# Patient Record
Sex: Male | Born: 1940 | Race: White | Hispanic: No | Marital: Married | Smoking: Former smoker
Health system: Southern US, Community
[De-identification: ages and names within clinical notes are randomized; demographics above are authoritative.]

## PROBLEM LIST (undated history)

## (undated) DIAGNOSIS — R51 Headache: Secondary | ICD-10-CM

## (undated) DIAGNOSIS — I472 Ventricular tachycardia: Secondary | ICD-10-CM

## (undated) DIAGNOSIS — K267 Chronic duodenal ulcer without hemorrhage or perforation: Secondary | ICD-10-CM

## (undated) DIAGNOSIS — M6530 Trigger finger, unspecified finger: Secondary | ICD-10-CM

## (undated) DIAGNOSIS — Z87442 Personal history of urinary calculi: Secondary | ICD-10-CM

## (undated) DIAGNOSIS — I300 Acute nonspecific idiopathic pericarditis: Secondary | ICD-10-CM

## (undated) DIAGNOSIS — R519 Headache, unspecified: Secondary | ICD-10-CM

## (undated) DIAGNOSIS — J45909 Unspecified asthma, uncomplicated: Secondary | ICD-10-CM

## (undated) DIAGNOSIS — I4729 Other ventricular tachycardia: Secondary | ICD-10-CM

## (undated) DIAGNOSIS — I480 Paroxysmal atrial fibrillation: Secondary | ICD-10-CM

## (undated) DIAGNOSIS — E119 Type 2 diabetes mellitus without complications: Secondary | ICD-10-CM

## (undated) DIAGNOSIS — M545 Low back pain, unspecified: Secondary | ICD-10-CM

## (undated) DIAGNOSIS — E039 Hypothyroidism, unspecified: Secondary | ICD-10-CM

## (undated) DIAGNOSIS — IMO0001 Reserved for inherently not codable concepts without codable children: Secondary | ICD-10-CM

## (undated) DIAGNOSIS — K219 Gastro-esophageal reflux disease without esophagitis: Secondary | ICD-10-CM

## (undated) DIAGNOSIS — G8929 Other chronic pain: Secondary | ICD-10-CM

## (undated) DIAGNOSIS — N189 Chronic kidney disease, unspecified: Secondary | ICD-10-CM

## (undated) DIAGNOSIS — E785 Hyperlipidemia, unspecified: Secondary | ICD-10-CM

## (undated) DIAGNOSIS — J329 Chronic sinusitis, unspecified: Secondary | ICD-10-CM

## (undated) DIAGNOSIS — C3490 Malignant neoplasm of unspecified part of unspecified bronchus or lung: Secondary | ICD-10-CM

## (undated) HISTORY — DX: Other ventricular tachycardia: I47.29

## (undated) HISTORY — DX: Ventricular tachycardia: I47.2

## (undated) HISTORY — DX: Paroxysmal atrial fibrillation: I48.0

## (undated) HISTORY — PX: CATARACT EXTRACTION W/ INTRAOCULAR LENS  IMPLANT, BILATERAL: SHX1307

## (undated) HISTORY — DX: Acute nonspecific idiopathic pericarditis: I30.0

## (undated) HISTORY — PX: ATRIAL FIBRILLATION ABLATION: SHX5732

## (undated) HISTORY — PX: OTHER SURGICAL HISTORY: SHX169

## (undated) HISTORY — PX: BACK SURGERY: SHX140

## (undated) HISTORY — DX: Reserved for inherently not codable concepts without codable children: IMO0001

---

## 1982-05-25 HISTORY — PX: LUMBAR MICRODISCECTOMY: SHX99

## 1993-05-25 DIAGNOSIS — C3490 Malignant neoplasm of unspecified part of unspecified bronchus or lung: Secondary | ICD-10-CM

## 1993-05-25 HISTORY — PX: PNEUMONECTOMY: SHX168

## 1993-05-25 HISTORY — DX: Malignant neoplasm of unspecified part of unspecified bronchus or lung: C34.90

## 1998-01-15 ENCOUNTER — Ambulatory Visit (HOSPITAL_BASED_OUTPATIENT_CLINIC_OR_DEPARTMENT_OTHER): Admission: RE | Admit: 1998-01-15 | Discharge: 1998-01-15 | Payer: Self-pay | Admitting: Surgery

## 1998-05-25 HISTORY — PX: INGUINAL HERNIA REPAIR: SUR1180

## 1998-09-20 ENCOUNTER — Ambulatory Visit (HOSPITAL_COMMUNITY): Admission: RE | Admit: 1998-09-20 | Discharge: 1998-09-20 | Payer: Self-pay | Admitting: Unknown Physician Specialty

## 1998-09-20 ENCOUNTER — Encounter: Payer: Self-pay | Admitting: Unknown Physician Specialty

## 1999-05-27 ENCOUNTER — Encounter: Payer: Self-pay | Admitting: Oncology

## 1999-05-27 ENCOUNTER — Ambulatory Visit (HOSPITAL_COMMUNITY): Admission: RE | Admit: 1999-05-27 | Discharge: 1999-05-27 | Payer: Self-pay | Admitting: Oncology

## 2000-05-31 ENCOUNTER — Encounter: Payer: Self-pay | Admitting: Oncology

## 2000-05-31 ENCOUNTER — Encounter: Admission: RE | Admit: 2000-05-31 | Discharge: 2000-05-31 | Payer: Self-pay | Admitting: Oncology

## 2001-06-02 ENCOUNTER — Ambulatory Visit (HOSPITAL_COMMUNITY): Admission: RE | Admit: 2001-06-02 | Discharge: 2001-06-02 | Payer: Self-pay | Admitting: Oncology

## 2001-06-02 ENCOUNTER — Encounter: Payer: Self-pay | Admitting: Oncology

## 2001-11-22 ENCOUNTER — Ambulatory Visit (HOSPITAL_COMMUNITY): Admission: RE | Admit: 2001-11-22 | Discharge: 2001-11-22 | Payer: Self-pay | Admitting: Ophthalmology

## 2002-06-01 ENCOUNTER — Encounter: Payer: Self-pay | Admitting: Oncology

## 2002-06-01 ENCOUNTER — Ambulatory Visit (HOSPITAL_COMMUNITY): Admission: RE | Admit: 2002-06-01 | Discharge: 2002-06-01 | Payer: Self-pay | Admitting: Oncology

## 2003-06-05 ENCOUNTER — Encounter: Admission: RE | Admit: 2003-06-05 | Discharge: 2003-06-05 | Payer: Self-pay | Admitting: Oncology

## 2004-05-29 ENCOUNTER — Ambulatory Visit: Payer: Self-pay | Admitting: Oncology

## 2004-05-29 ENCOUNTER — Ambulatory Visit (HOSPITAL_COMMUNITY): Admission: RE | Admit: 2004-05-29 | Discharge: 2004-05-29 | Payer: Self-pay | Admitting: Oncology

## 2004-07-15 ENCOUNTER — Ambulatory Visit: Payer: Self-pay | Admitting: Family Medicine

## 2004-07-22 ENCOUNTER — Ambulatory Visit: Payer: Self-pay | Admitting: Family Medicine

## 2005-03-05 ENCOUNTER — Ambulatory Visit: Payer: Self-pay | Admitting: Family Medicine

## 2005-05-27 ENCOUNTER — Ambulatory Visit: Payer: Self-pay | Admitting: Oncology

## 2005-05-28 ENCOUNTER — Ambulatory Visit (HOSPITAL_COMMUNITY): Admission: RE | Admit: 2005-05-28 | Discharge: 2005-05-28 | Payer: Self-pay | Admitting: Oncology

## 2005-07-21 ENCOUNTER — Ambulatory Visit: Payer: Self-pay | Admitting: Family Medicine

## 2005-08-27 ENCOUNTER — Ambulatory Visit: Payer: Self-pay | Admitting: Oncology

## 2005-11-24 ENCOUNTER — Ambulatory Visit: Payer: Self-pay | Admitting: Family Medicine

## 2005-12-07 ENCOUNTER — Ambulatory Visit: Payer: Self-pay | Admitting: Family Medicine

## 2005-12-14 ENCOUNTER — Ambulatory Visit: Payer: Self-pay | Admitting: Family Medicine

## 2006-02-02 ENCOUNTER — Ambulatory Visit: Payer: Self-pay | Admitting: Family Medicine

## 2006-03-31 ENCOUNTER — Ambulatory Visit: Payer: Self-pay | Admitting: Family Medicine

## 2006-05-10 ENCOUNTER — Ambulatory Visit: Payer: Self-pay | Admitting: Family Medicine

## 2006-05-25 DIAGNOSIS — I300 Acute nonspecific idiopathic pericarditis: Secondary | ICD-10-CM

## 2006-05-25 HISTORY — DX: Acute nonspecific idiopathic pericarditis: I30.0

## 2006-05-26 ENCOUNTER — Ambulatory Visit: Payer: Self-pay | Admitting: Oncology

## 2006-05-31 ENCOUNTER — Ambulatory Visit (HOSPITAL_COMMUNITY): Admission: RE | Admit: 2006-05-31 | Discharge: 2006-05-31 | Payer: Self-pay | Admitting: Oncology

## 2006-07-19 ENCOUNTER — Ambulatory Visit: Payer: Self-pay | Admitting: Family Medicine

## 2006-08-19 ENCOUNTER — Encounter: Payer: Self-pay | Admitting: Cardiology

## 2006-08-25 ENCOUNTER — Ambulatory Visit: Payer: Self-pay | Admitting: Family Medicine

## 2006-09-22 ENCOUNTER — Ambulatory Visit: Payer: Self-pay | Admitting: Family Medicine

## 2007-05-25 ENCOUNTER — Ambulatory Visit: Payer: Self-pay | Admitting: Oncology

## 2007-05-30 ENCOUNTER — Ambulatory Visit (HOSPITAL_COMMUNITY): Admission: RE | Admit: 2007-05-30 | Discharge: 2007-05-30 | Payer: Self-pay | Admitting: Oncology

## 2007-06-08 ENCOUNTER — Ambulatory Visit: Payer: Self-pay | Admitting: Thoracic Surgery

## 2007-06-14 ENCOUNTER — Ambulatory Visit: Payer: Self-pay | Admitting: Thoracic Surgery

## 2007-06-14 ENCOUNTER — Encounter: Admission: RE | Admit: 2007-06-14 | Discharge: 2007-06-14 | Payer: Self-pay | Admitting: Thoracic Surgery

## 2007-09-06 ENCOUNTER — Ambulatory Visit: Payer: Self-pay | Admitting: Thoracic Surgery

## 2007-09-23 ENCOUNTER — Ambulatory Visit: Payer: Self-pay | Admitting: Oncology

## 2008-05-29 ENCOUNTER — Ambulatory Visit: Payer: Self-pay | Admitting: Oncology

## 2009-02-11 ENCOUNTER — Ambulatory Visit (HOSPITAL_COMMUNITY): Admission: RE | Admit: 2009-02-11 | Discharge: 2009-02-11 | Payer: Self-pay | Admitting: Oncology

## 2009-02-12 ENCOUNTER — Ambulatory Visit: Payer: Self-pay | Admitting: Oncology

## 2009-05-29 ENCOUNTER — Ambulatory Visit: Payer: Self-pay | Admitting: Oncology

## 2009-05-31 ENCOUNTER — Ambulatory Visit (HOSPITAL_COMMUNITY): Admission: RE | Admit: 2009-05-31 | Discharge: 2009-05-31 | Payer: Self-pay | Admitting: Oncology

## 2009-07-15 ENCOUNTER — Ambulatory Visit (HOSPITAL_COMMUNITY): Admission: RE | Admit: 2009-07-15 | Discharge: 2009-07-15 | Payer: Self-pay | Admitting: Family Medicine

## 2009-10-31 ENCOUNTER — Ambulatory Visit (HOSPITAL_COMMUNITY): Admission: RE | Admit: 2009-10-31 | Discharge: 2009-10-31 | Payer: Self-pay | Admitting: Oncology

## 2009-10-31 ENCOUNTER — Ambulatory Visit: Payer: Self-pay | Admitting: Oncology

## 2010-05-23 ENCOUNTER — Ambulatory Visit: Payer: Self-pay | Admitting: Oncology

## 2010-05-30 ENCOUNTER — Ambulatory Visit: Payer: Self-pay | Admitting: Oncology

## 2010-05-30 ENCOUNTER — Ambulatory Visit (HOSPITAL_COMMUNITY)
Admission: RE | Admit: 2010-05-30 | Discharge: 2010-05-30 | Payer: Self-pay | Source: Home / Self Care | Attending: Oncology | Admitting: Oncology

## 2010-10-07 NOTE — Letter (Signed)
June 08, 2007   Leighton Roach. Truett Perna, M.D.  [address] [facility]  [place] [address]   Re:  Zachary Bean, Zachary Bean                 DOB:  [birth year]   Dear Nida Boatman:   I saw the patient back today and we discussed his recurrent coughing.  I  agree that this is probably not recurrent cancer, but there could  possibly be something intrabronchially that could be causing him to  cough.  I plan to get a CT scan first, and then will probably go ahead  and do a bronchoscopy after the CT scan.  I will see him back here after  the CT scan.  His weight has been stable, and he has, other than a  cough, an excessive sputum.  He has no other pulmonary symptoms.  His  right lung was clear to auscultation and percussion.  Chest x-rays  showed a left pneumonectomy.  His blood pressure was 118/69, pulse 63,  respirations 18, sats were 95%.   Zachary Bean, M.D.  Electronically Signed   DPB/MEDQ  D:  06/08/2007  T:  06/08/2007  Job:  [id number]

## 2010-10-07 NOTE — Letter (Signed)
June 14, 2007   Leighton Roach. Truett Perna, M.D.  [address] [facility]  [place] [address]   Re:  Zachary Bean, Zachary Bean                 DOB:  13-Nov-1940   Dear Nida Boatman:   I saw Mr. Haack back in the office today.  His cough has improved.  His  lungs are clear to auscultation and percussion.  Blood pressure is  118/67, pulse 77, respirations 18, sats were 98%.  His CT scan showed no  evidence of recurrence of his cancer since the CT scan is negative.  I  doubt that he needs a bronchoscopy if his cough persists.  I said to let  us know and we would be happy to schedule him to have one in about 3-4  weeks.   Sincerely,   Ines Bloomer, M.D.  Electronically Signed   DPB/MEDQ  D:  06/14/2007  T:  06/14/2007  Job:  [id number]

## 2011-05-18 ENCOUNTER — Telehealth: Payer: Self-pay | Admitting: Oncology

## 2011-05-18 NOTE — Telephone Encounter (Signed)
Converted 1/7 appt to EPIC. lmonvm for pt re new d/t 1/11 @ 3:45 pm.

## 2011-06-05 ENCOUNTER — Other Ambulatory Visit: Payer: Self-pay | Admitting: Oncology

## 2011-06-05 ENCOUNTER — Ambulatory Visit (HOSPITAL_BASED_OUTPATIENT_CLINIC_OR_DEPARTMENT_OTHER): Payer: Medicare Other | Admitting: Oncology

## 2011-06-05 ENCOUNTER — Ambulatory Visit (HOSPITAL_COMMUNITY)
Admission: RE | Admit: 2011-06-05 | Discharge: 2011-06-05 | Disposition: A | Discharge status: Home / Self Care | Payer: Worker's Compensation | Source: Ambulatory Visit | Attending: Oncology | Admitting: Oncology

## 2011-06-05 ENCOUNTER — Telehealth: Payer: Self-pay | Admitting: Oncology

## 2011-06-05 VITALS — BP 110/62 | HR 74 | Temp 97.8°F | Ht 70.5 in | Wt 183.0 lb

## 2011-06-05 DIAGNOSIS — C3490 Malignant neoplasm of unspecified part of unspecified bronchus or lung: Secondary | ICD-10-CM

## 2011-06-05 DIAGNOSIS — Z85118 Personal history of other malignant neoplasm of bronchus and lung: Secondary | ICD-10-CM | POA: Insufficient documentation

## 2011-06-05 NOTE — Progress Notes (Signed)
OFFICE PROGRESS NOTE   INTERVAL HISTORY:   He feels well. He reports a good appetite and energy level. He has stable mild exertional dyspnea. He continues close followup with Dr. Paulino Rily for management of diabetes.    Objective:  Vital signs in last 24 hours:  Blood pressure 110/62, pulse 74, temperature 97.8 F (36.6 C), temperature source Oral, height 5' 10.5" (1.791 m), weight 183 lb (83.008 kg).    HEENT: Neck without mass Lymphatics: No cervical, supraclavicular, or axillary nodes Resp: Decreased breath sounds over the left chest. The right lung is clear. No respiratory distress. Cardio: Regular rate and rhythm GI: No hepatomegaly Vascular: No leg edema Musculoskeletal: Left thoracotomy scar without evidence of recurrent tumor.   X-rays: Chest x-ray on 06/05/2011-negative for metastatic or acute disease. Stable compared to prior exam.  Medications: I have reviewed the patient's current medications.  Assessment/Plan: 1. Stage III non-small cell lung cancer diagnosed in September 1995.  He remains in clinical remission. 2. History of a cough following an ACE inhibitor. 3. Admission to [facility] in February 2008 with atrial fibrillation and a pericardial effusion.   Disposition:  He remains in clinical remission from lung cancer. He would like to continue followup at the cancer Center. He will return for an office visit and chest x-ray in one year.   Lucile Shutters, MD  06/05/2011  5:00 PM

## 2011-06-05 NOTE — Telephone Encounter (Signed)
gv pt appt schedule for jan 2014 and referral for cxr to be done same day as visit

## 2011-08-13 ENCOUNTER — Encounter: Payer: Self-pay | Admitting: *Deleted

## 2011-08-24 DIAGNOSIS — IMO0001 Reserved for inherently not codable concepts without codable children: Secondary | ICD-10-CM

## 2011-08-24 HISTORY — DX: Reserved for inherently not codable concepts without codable children: IMO0001

## 2011-09-04 ENCOUNTER — Observation Stay (HOSPITAL_COMMUNITY)
Admission: EM | Admit: 2011-09-04 | Discharge: 2011-09-06 | DRG: 313 | Disposition: A | Discharge status: Home / Self Care | Payer: Medicare Other | Attending: Cardiovascular Disease | Admitting: Cardiovascular Disease

## 2011-09-04 ENCOUNTER — Emergency Department (HOSPITAL_COMMUNITY): Payer: Medicare Other

## 2011-09-04 ENCOUNTER — Encounter (HOSPITAL_COMMUNITY): Payer: Self-pay | Admitting: *Deleted

## 2011-09-04 ENCOUNTER — Other Ambulatory Visit: Payer: Self-pay

## 2011-09-04 DIAGNOSIS — R079 Chest pain, unspecified: Principal | ICD-10-CM | POA: Insufficient documentation

## 2011-09-04 DIAGNOSIS — Z85118 Personal history of other malignant neoplasm of bronchus and lung: Secondary | ICD-10-CM | POA: Insufficient documentation

## 2011-09-04 DIAGNOSIS — Z902 Acquired absence of lung [part of]: Secondary | ICD-10-CM | POA: Insufficient documentation

## 2011-09-04 DIAGNOSIS — I4729 Other ventricular tachycardia: Secondary | ICD-10-CM | POA: Insufficient documentation

## 2011-09-04 DIAGNOSIS — I472 Ventricular tachycardia, unspecified: Secondary | ICD-10-CM | POA: Insufficient documentation

## 2011-09-04 DIAGNOSIS — I4891 Unspecified atrial fibrillation: Secondary | ICD-10-CM | POA: Insufficient documentation

## 2011-09-04 DIAGNOSIS — R110 Nausea: Secondary | ICD-10-CM | POA: Insufficient documentation

## 2011-09-04 DIAGNOSIS — E119 Type 2 diabetes mellitus without complications: Secondary | ICD-10-CM | POA: Insufficient documentation

## 2011-09-04 LAB — CBC
MCH: 30.9 pg (ref 26.0–34.0)
MCHC: 33.9 g/dL (ref 30.0–36.0)
Platelets: 232 10*3/uL (ref 150–400)
RDW: 12.1 % (ref 11.5–15.5)

## 2011-09-04 LAB — POCT I-STAT, CHEM 8
Creatinine, Ser: 1.2 mg/dL (ref 0.50–1.35)
Hemoglobin: 14.3 g/dL (ref 13.0–17.0)
Sodium: 137 mEq/L (ref 135–145)
TCO2: 26 mmol/L (ref 0–100)

## 2011-09-04 LAB — POCT I-STAT TROPONIN I: Troponin i, poc: 0.01 ng/mL (ref 0.00–0.08)

## 2011-09-04 MED ORDER — NITROGLYCERIN 0.4 MG SL SUBL: 0.4000 mg | SUBLINGUAL_TABLET | SUBLINGUAL | Status: DC | PRN

## 2011-09-04 MED ORDER — NITROGLYCERIN 0.3 MG SL SUBL
0.3000 mg | SUBLINGUAL_TABLET | SUBLINGUAL | Status: DC | PRN
  Administered 2011-09-04: 0.3 mg via SUBLINGUAL
  Filled 2011-09-04: qty 100

## 2011-09-04 MED ORDER — METFORMIN HCL 500 MG PO TABS
500.0000 mg | ORAL_TABLET | Freq: Every day | ORAL | Status: DC
  Administered 2011-09-06: 500 mg via ORAL
  Filled 2011-09-04 (×2): qty 1

## 2011-09-04 MED ORDER — SIMVASTATIN 20 MG PO TABS
20.0000 mg | ORAL_TABLET | Freq: Every day | ORAL | Status: DC
  Administered 2011-09-04 – 2011-09-05 (×2): 20 mg via ORAL
  Filled 2011-09-04 (×3): qty 1

## 2011-09-04 MED ORDER — SODIUM CHLORIDE 0.9 % IJ SOLN
3.0000 mL | Freq: Two times a day (BID) | INTRAMUSCULAR | Status: DC
  Administered 2011-09-05 (×2): 3 mL via INTRAVENOUS

## 2011-09-04 MED ORDER — SODIUM CHLORIDE 0.9 % IJ SOLN
3.0000 mL | Freq: Two times a day (BID) | INTRAMUSCULAR | Status: DC
  Administered 2011-09-04 – 2011-09-05 (×2): 3 mL via INTRAVENOUS

## 2011-09-04 MED ORDER — LOSARTAN POTASSIUM 50 MG PO TABS
50.0000 mg | ORAL_TABLET | Freq: Every day | ORAL | Status: DC
  Administered 2011-09-05 – 2011-09-06 (×2): 50 mg via ORAL
  Filled 2011-09-04 (×2): qty 1

## 2011-09-04 MED ORDER — SODIUM CHLORIDE 0.9 % IJ SOLN: 3.0000 mL | INTRAMUSCULAR | Status: DC | PRN

## 2011-09-04 MED ORDER — SODIUM CHLORIDE 0.9 % IV SOLN: 250.0000 mL | INTRAVENOUS | Status: DC | PRN

## 2011-09-04 MED ORDER — ASPIRIN EC 325 MG PO TBEC
325.0000 mg | DELAYED_RELEASE_TABLET | Freq: Every day | ORAL | Status: DC
  Administered 2011-09-05 – 2011-09-06 (×2): 325 mg via ORAL
  Filled 2011-09-04 (×2): qty 1

## 2011-09-04 MED ORDER — ACETAMINOPHEN 325 MG PO TABS
650.0000 mg | ORAL_TABLET | Freq: Four times a day (QID) | ORAL | Status: DC | PRN
  Administered 2011-09-04: 650 mg via ORAL
  Filled 2011-09-04: qty 2

## 2011-09-04 MED ORDER — LEVOTHYROXINE SODIUM 100 MCG PO TABS
100.0000 ug | ORAL_TABLET | Freq: Every day | ORAL | Status: DC
  Administered 2011-09-05 – 2011-09-06 (×2): 100 ug via ORAL
  Filled 2011-09-04 (×3): qty 1

## 2011-09-04 MED ORDER — ACETAMINOPHEN 650 MG RE SUPP: 650.0000 mg | Freq: Four times a day (QID) | RECTAL | Status: DC | PRN

## 2011-09-04 MED ORDER — GLIPIZIDE ER 2.5 MG PO TB24
2.5000 mg | ORAL_TABLET | Freq: Every day | ORAL | Status: DC
  Administered 2011-09-06: 2.5 mg via ORAL
  Filled 2011-09-04 (×2): qty 1

## 2011-09-04 MED ORDER — OXYCODONE HCL 5 MG PO TABS: 5.0000 mg | ORAL_TABLET | ORAL | Status: DC | PRN

## 2011-09-04 MED ORDER — MORPHINE SULFATE 2 MG/ML IJ SOLN: 1.0000 mg | INTRAMUSCULAR | Status: DC | PRN

## 2011-09-04 MED ORDER — NITROGLYCERIN 0.4 MG SL SUBL
SUBLINGUAL_TABLET | SUBLINGUAL | Status: AC
  Filled 2011-09-04: qty 25

## 2011-09-04 MED ORDER — PANTOPRAZOLE SODIUM 40 MG PO TBEC
40.0000 mg | DELAYED_RELEASE_TABLET | Freq: Every day | ORAL | Status: DC
  Administered 2011-09-05: 40 mg via ORAL
  Filled 2011-09-04: qty 1

## 2011-09-04 NOTE — ED Notes (Signed)
[id number] Ready 

## 2011-09-04 NOTE — ED Notes (Signed)
Patient to be admitted; ordered dinner tray.

## 2011-09-04 NOTE — ED Notes (Signed)
Attempted to call report.  RN unable to take report.  Phone number and name given.  RN will return call.

## 2011-09-04 NOTE — ED Provider Notes (Signed)
History     CSN: [id number]  Arrival date & time 09/04/11  1408   First MD Initiated Contact with Patient 09/04/11 1646      Chief Complaint  Patient presents with  . Chest Pain     HPI Pt states last night he had an episode of left arm pain that went away on its own. States this am started having left arm pain that started radiating into chest and left side of jaw. Reports nausea.       Past Medical History  Diagnosis Date  . Acute idiopathic pericarditis     history of  . PAF (paroxysmal atrial fibrillation)   . DM (diabetes mellitus)   . History of lung cancer     Past Surgical History  Procedure Date  . Back surgery 1984  . Pneumonectomy 1995  . Hernia repair 2000    Family History  Problem Relation Age of Onset  . Heart failure    . Stroke    . Cancer    . Emphysema    . Hypertension    . Diabetes    . Coronary artery disease      History  Substance Use Topics  . Smoking status: Former Games developer  . Smokeless tobacco: Not on file  . Alcohol Use: No      Review of Systems  All other systems reviewed and are negative.    Allergies  Contrast media  Home Medications   Current Outpatient Rx  Name Route Sig Dispense Refill  . ASPIRIN 81 MG PO TABS Oral Take 81 mg by mouth at bedtime.     . OMEGA-3 FATTY ACIDS 1000 MG PO CAPS Oral Take 1 g by mouth 2 (two) times daily.    Marland Kitchen GLIPIZIDE ER 2.5 MG PO TB24 Oral Take 2.5 mg by mouth daily.    Marland Kitchen LEVOTHYROXINE SODIUM 100 MCG PO TABS Oral Take 100 mcg by mouth daily.    Marland Kitchen LOSARTAN POTASSIUM 50 MG PO TABS Oral Take 25 mg by mouth daily.     Marland Kitchen METFORMIN HCL 500 MG PO TABS Oral Take 250 mg by mouth at bedtime.     Marland Kitchen METOPROLOL SUCCINATE ER 50 MG PO TB24 Oral Take 50 mg by mouth daily. Take with or immediately following a meal.    . RANITIDINE HCL 150 MG PO TABS Oral Take 150 mg by mouth 2 (two) times daily.    Marland Kitchen SIMVASTATIN 20 MG PO TABS Oral Take 20 mg by mouth at bedtime.       BP 133/66  Pulse 64   Temp(Src) 98.2 F (36.8 C) (Oral)  Resp 13  SpO2 97%  Physical Exam  Nursing note and vitals reviewed. Constitutional: He is oriented to person, place, and time. He appears well-developed and well-nourished. No distress.  HENT:  Head: Normocephalic and atraumatic.  Eyes: Pupils are equal, round, and reactive to light.  Neck: Normal range of motion.  Cardiovascular: Normal rate and intact distal pulses.   No murmur heard.       Date: 09/04/2011  Rate: 65  Rhythm: normal sinus rhythm  QRS Axis: normal  Intervals: normal  ST/T Wave abnormalities: normal  Conduction Disutrbances: none  Narrative Interpretation: unremarkable      Pulmonary/Chest: No respiratory distress.  Abdominal: Normal appearance. He exhibits no distension.  Musculoskeletal: Normal range of motion.  Neurological: He is alert and oriented to person, place, and time. No cranial nerve deficit.  Skin: Skin is warm and dry. No  rash noted.  Psychiatric: He has a normal mood and affect. His behavior is normal.    ED Course  Procedures (including critical care time)  Labs Reviewed  POCT I-STAT, CHEM 8 - Abnormal; Notable for the following:    Glucose, Bld 112 (*)    All other components within normal limits  CBC  POCT I-STAT TROPONIN I   Dg Chest 2 View  09/04/2011  *RADIOLOGY REPORT*  Clinical Data: Chest pain  CHEST - 2 VIEW  Comparison: 05/26/2011  Findings: Again noted status post left pneumonectomy with complete opacification of the left hemithorax.  Right lung is clear.  Stable mild degenerative changes thoracic spine.  IMPRESSION: No active disease.  Status post left pneumonectomy.  No significant change  Original Report Authenticated By: [name], M.D.     1. Chest pain       MDM          Nelia Shi, MD 09/04/11 [phone]

## 2011-09-04 NOTE — H&P (Signed)
Physician History and Physical  Patient ID: Zachary Bean MRN: [id number] DOB/AGE: 02-Mar-1941 71 y.o. Admit date: 09/04/2011  Primary Care Physician: [name], MD, MD Primary Cardiologist:  [name]  Active Problems:  * No active hospital problems. *    HPI:  71 yo with left arm and jaw pain.  Started earlier today.  Intermitant.  No history of CAD.  Has been doing a lot of yard work but does not feel pain is positional or muscular.  Previous left Pneumonectomy by Dr Edwyna Shell for Lung CA.  History of afib with ablation at [facility].  Does fairly well at home and walks without difficulty so long as it is not up hill.  Denies dyspnea, palpitations PND, orthopnea, or edema.  No cough fever or pleurisy.  Compliant with meds.  CRF;s include DM on oral hypoglycemics not insulin.  ECG in ER normal as is POC enzymes but patient feels Jaw and arm still feel funny.    Review of systems complete and found to be negative unless listed above   Past Medical History  Diagnosis Date  . Acute idiopathic pericarditis     history of  . PAF (paroxysmal atrial fibrillation)   . DM (diabetes mellitus)   . History of lung cancer     Family History  Problem Relation Age of Onset  . Heart failure    . Stroke    . Cancer    . Emphysema    . Hypertension    . Diabetes    . Coronary artery disease      History   Social History  . Marital Status: Married    Spouse Name: N/A    Number of Children: N/A  . Years of Education: N/A   Occupational History  . retired    Social History Main Topics  . Smoking status: Former Games developer  . Smokeless tobacco: Not on file  . Alcohol Use: No  . Drug Use:   . Sexually Active:    Other Topics Concern  . Not on file   Social History Narrative  . No narrative on file    Past Surgical History  Procedure Date  . Back surgery 1984  . Pneumonectomy 1995  . Hernia repair 2000      (Not in a hospital admission)  Physical Exam: Blood pressure  133/66, pulse 64, temperature 98.2 F (36.8 C), temperature source Oral, resp. rate 13, SpO2 97.00%.    Thin male looks older than stated age Affect appropriate HEENT: normal Neck supple with no adenopathy JVP normal no bruits no thyromegaly Lungs Clear on right with pneumonectomy on left Heart:  S1/S2 no murmur, no rub, gallop or click PMI normal Abdomen: benighn, BS positve, no tenderness, no AAA no bruit.  No HSM or HJR Distal pulses intact with no bruits No edema Neuro non-focal Skin warm and dry No muscular weakness   Labs:   Lab Results  Component Value Date   WBC 7.6 09/04/2011   HGB 14.3 09/04/2011   HCT 42.0 09/04/2011   MCV 91.3 09/04/2011   PLT 232 09/04/2011    Lab 09/04/11 1549  NA 137  K 4.1  CL 102  CO2 --  BUN 17  CREATININE 1.20  CALCIUM --  PROT --  BILITOT --  ALKPHOS --  ALT --  AST --  GLUCOSE 112*      Radiology: Dg Chest 2 View  09/04/2011  *RADIOLOGY REPORT*  Clinical Data: Chest pain  CHEST - 2 VIEW  Comparison: 05/26/2011  Findings: Again noted status post left pneumonectomy with complete opacification of the left hemithorax.  Right lung is clear.  Stable mild degenerative changes thoracic spine.  IMPRESSION: No active disease.  Status post left pneumonectomy.  No significant change  Original Report Authenticated By: [name], M.D.    EKG: NSR normal with no acute ischemic changes  ASSESSMENT AND PLAN: Chest Pain:  Atypical with normal ECG and POC markers.  Diabetic with persistant symptoms.  Wife scared to take him home as well.  Reasonable to admit and do myovue in am  He indicates He is able to walk fairly well and prefer this to [place] given previous pneumonectomy DM:  Continue oral hypoglycemics.   Lung CA:  Past 15 year survivor with no recurrence.  CXR ok PAF:  S/P ablation with no palpitations and normal ECG today   Signed: [name]4/04/2012, 5:24 PM

## 2011-09-04 NOTE — ED Notes (Signed)
Pt states last night he had an episode of left arm pain that went away on its own. States this am started having left arm pain that started radiating into chest and left side of jaw. Reports nausea.

## 2011-09-04 NOTE — ED Notes (Signed)
Patient reporting pain to left jaw, arm and chest beginning last night. Patient reporting pain has worsened this morning through this afternoon with a loss of appetite. Patient denies SOB, dizziness, n/v at this time.

## 2011-09-04 NOTE — Progress Notes (Signed)
Pt stated he felt pain in his left arm that radiated to his jaw. He said his pain was a 6/10. Pt VS stable. HR 60's, BP 117/69, O2 sat 97% on RA.  Pt's  Pain was relived with two nitro. O2 was applied for comfort.  NP on call made aware. Will cont to monitor pt.

## 2011-09-05 ENCOUNTER — Inpatient Hospital Stay (HOSPITAL_COMMUNITY): Payer: Medicare Other

## 2011-09-05 DIAGNOSIS — I369 Nonrheumatic tricuspid valve disorder, unspecified: Secondary | ICD-10-CM

## 2011-09-05 DIAGNOSIS — R079 Chest pain, unspecified: Secondary | ICD-10-CM

## 2011-09-05 LAB — CARDIAC PANEL(CRET KIN+CKTOT+MB+TROPI)
CK, MB: 6.6 ng/mL (ref 0.3–4.0)
Relative Index: 2.1 (ref 0.0–2.5)
Relative Index: 2.4 (ref 0.0–2.5)
Relative Index: 2.4 (ref 0.0–2.5)
Total CK: 321 U/L — ABNORMAL HIGH (ref 7–232)
Total CK: 259 U/L — ABNORMAL HIGH (ref 7–232)
Total CK: 256 U/L — ABNORMAL HIGH (ref 7–232)
Troponin I: <0.3 ng/mL (ref <0.30)

## 2011-09-05 LAB — MAGNESIUM: Magnesium: 2.3 mg/dL (ref 1.5–2.5)

## 2011-09-05 MED ORDER — REGADENOSON 0.4 MG/5ML IV SOLN
0.4000 mg | Freq: Once | INTRAVENOUS | Status: AC
  Administered 2011-09-05: 0.4 mg via INTRAVENOUS
  Filled 2011-09-05: qty 5

## 2011-09-05 MED ORDER — TECHNETIUM TC 99M TETROFOSMIN IV KIT
30.0000 | PACK | Freq: Once | INTRAVENOUS | Status: AC | PRN
  Administered 2011-09-05: 30 via INTRAVENOUS

## 2011-09-05 MED ORDER — TECHNETIUM TC 99M TETROFOSMIN IV KIT
10.0000 | PACK | Freq: Once | INTRAVENOUS | Status: AC | PRN
  Administered 2011-09-05: 10 via INTRAVENOUS

## 2011-09-05 NOTE — Discharge Summary (Signed)
Discharge Summary   Patient ID: Zachary Bean MRN: [id number], DOB/AGE: 1941/02/11 71 y.o. Admit date: 09/04/2011 D/C date:     09/06/2011    Primary Care Physician:  [name], MD, MD  Primary Cardiologist:  Dr. Zaccary Creech [place]  Primary Electrophysiologist:  None   Reason for Admission:  Left Arm, Chest and Jaw Pain  Primary Discharge Diagnoses:  1.  Chest Pain, Etiology Unclear  A.  Suspect Musculoskeletal Pain  B.  No ischemia on nuclear study; EF normal on Echo 2.  Non-Sustained VTach  A.  Noted on Tele  B.  Echo with normal EF - continue beta blocker 3.  Lung CA s/p Pneumonectomy 4.  Diabetes Mellitus 5.  Paroxysmal Atrial Fibrillation s/p Ablation at [facility]   Secondary Discharge Diagnoses:   Past Medical History  Diagnosis Date  . Acute idiopathic pericarditis     history of  . PAF (paroxysmal atrial fibrillation)   . DM (diabetes mellitus)   . History of lung cancer      Procedures Performed This Admission:  None   Hospital Course: Zachary Bean is a 71 y.o. male who presented to the hospital with left arm, chest and jaw pain.  He had been doing a lot of yard work but did not feel it was positional.  He has a h/o DM2, lung cancer, s/p pneumonectomy, AFib, s/p ablation at [facility] in the past.   CEs remained negative.  He had 7 beats of NSVT noted on telemetry.  E-lytes were normal.  Patient underwent Lexiscan Myoview 09/05/11.  Nuclear images reviewed by Dr. [name] and EF noted to be mildly reduced but no ischemia or scar.  With NSVT noted on tele, he was kept in house for Echo.  Echo demonstrated normal LV function.  He is felt ready for d/c to home.   Discharge Vitals: Blood pressure 110/68, pulse 86, temperature 98.7 F (37.1 C), temperature source Oral, resp. rate 18, height 5\' 10"  (1.778 m), weight 182 lb 12.2 oz (82.9 kg), SpO2 96.00%.  Labs: Lab Results  Component Value Date   WBC 7.6 09/04/2011   HGB 14.3 09/04/2011   HCT 42.0 09/04/2011   MCV 91.3  09/04/2011   PLT 232 09/04/2011     Lab 09/04/11 1549  NA 137  K 4.1  CL 102  CO2 --  BUN 17  CREATININE 1.20  CALCIUM --  PROT --  BILITOT --  ALKPHOS --  ALT --  AST --  GLUCOSE 112*    Basename 09/05/11 1340 09/05/11 1258 09/05/11 0218  CKTOTAL 256* 259* 321*  CKMB 6.1* 6.1* 6.6*  TROPONINI <0.30 <0.30 <0.30    Diagnostic Studies/Procedures:   Dg Chest 2 View  09/04/2011   IMPRESSION: No active disease.  Status post left pneumonectomy.  No significant change  Original Report Authenticated By: [name], M.D.   Nm Myocar Multi W/spect W/wall Motion / Ef  09/05/2011   IMPRESSION:  1.  No reversible ischemia or infarction. 2.  Normal wall motion. 3.  Left ventricular ejection fraction equal 49 %  Original Report Authenticated By: Genevive Bi, M.D.    2D Echocardiogram 09/05/11: Study Conclusions  - Left ventricle: The cavity size was normal. Systolic function was normal. The estimated ejection fraction was in the range of 55% to 60%. Wall motion was normal; there were no regional wall motion abnormalities. - Right atrium: The atrium was mildly dilated. - Atrial septum: No defect or patent foramen ovale was identified.   Discharge Medications  Medication List  As of 09/06/2011  8:31 AM   TAKE these medications         aspirin 81 MG tablet   Take 81 mg by mouth at bedtime.      fish oil-omega-3 fatty acids 1000 MG capsule   Take 1 g by mouth 2 (two) times daily.      glipiZIDE 2.5 MG 24 hr tablet   Commonly known as: GLUCOTROL XL   Take 2.5 mg by mouth daily.      levothyroxine 100 MCG tablet   Commonly known as: SYNTHROID, LEVOTHROID   Take 100 mcg by mouth daily.      losartan 50 MG tablet   Commonly known as: COZAAR   Take 25 mg by mouth daily.      metFORMIN 500 MG tablet   Commonly known as: GLUCOPHAGE   Take 250 mg by mouth at bedtime.      metoprolol succinate 50 MG 24 hr tablet   Commonly known as: TOPROL-XL   Take 50 mg by mouth  daily. Take with or immediately following a meal.      nitroGLYCERIN 0.4 MG SL tablet   Commonly known as: NITROSTAT   Place 1 tablet (0.4 mg total) under the tongue every 5 (five) minutes as needed for chest pain.      ranitidine 150 MG tablet   Commonly known as: ZANTAC   Take 150 mg by mouth 2 (two) times daily.      simvastatin 20 MG tablet   Commonly known as: ZOCOR   Take 20 mg by mouth at bedtime.            Disposition   The patient will be discharged in stable condition to home. Discharge Orders    Future Appointments: Provider: Department: Dept Phone: Center:   06/02/2012 10:30 AM Ladene Artist, MD [facility] [phone] None     Future Orders Please Complete By Expires   Diet - low sodium heart healthy      Increase activity slowly        Follow-up Information    Follow up with [name], MD. Schedule an appointment as soon as possible for a visit in 2 weeks.      Follow up with Soriya Worster [place], MD in 2 weeks. (office will call you for an appointment)    Contact information:   [address] [place] [address] [phone]            Duration of Discharge Encounter: Greater than 30 minutes including physician and PA time.  Signed, Tereso Newcomer, PA-C  8:31 AM 09/06/2011        [address] [place], [place]  [address] Phone: [phone] Fax:  [phone]

## 2011-09-05 NOTE — Progress Notes (Signed)
Pt was asleep in bed and had a 7 beats of wide QRS. Pt's VS stable. BP 117/62, O2 98% on ra. Pt's  resting HR has been in  the 60's SR. MD on call made aware and new orders received. Will cont to monitor pt.

## 2011-09-05 NOTE — Progress Notes (Signed)
Nm Myocar Multi W/spect W/wall Motion / Ef  09/05/2011  *RADIOLOGY REPORT*  Clinical Data:  71 year old male with chest pain.  MYOCARDIAL IMAGING WITH SPECT (REST AND PHARMACOLOGIC-STRESS) GATED LEFT VENTRICULAR WALL MOTION STUDY LEFT VENTRICULAR EJECTION FRACTION  Technique:  Resting myocardial SPECT imaging was initially performed after intravenous administration of radiopharmaceutical. Myocardial SPECT was subsequently performed after additional radiopharmaceutical injection during pharmacologic-stress supervised by the Cardiology staff.  Quantitative gated imaging was also performed to evaluate left ventricular wall motion, and estimate left ventricular ejection fraction.  Radiopharmaceutical:  Tc-48m Myoview at rest and during stress.  Comparison: none  Findings:  Technique: Study is adequate.  Perfusion:  There are no relative decreased counts on stress or rest to suggest reversible ischemia or infarction.  Wall motion:  No focal wall motion abnormality.  Normal contractility.  Left ventricular ejection fraction:  Calculated left ventricular ejection fraction =  49%  IMPRESSION:  1.  No reversible ischemia or infarction. 2.  Normal wall motion. 3.  Left ventricular ejection fraction equal 49 %  Original Report Authenticated By: Genevive Bi, M.D.    D/w Dr. [name].  Patient had one episode of NSVT noted on Tele. Will keep in house until tomorrow and order Echo for today to confirm EF. Tereso Newcomer, PA-C  2:15 PM 09/05/2011

## 2011-09-05 NOTE — Progress Notes (Signed)
CRITICAL VALUE ALERT  Critical value received: CKMB 6.6  Date of notification: 09/11/2011  Time of notification:  0400am  Critical value read back: Yes  Nurse who received alert: Mellody Dance  MD notified (1st page):  [place]  Time of first page:  0405  MD notified (2nd page):  Time of second page:  Responding MD: Bamimore  Time MD responded: 0410   No new orders received. Will cont to monitor pt.

## 2011-09-05 NOTE — Progress Notes (Addendum)
Progress Note  Name:  Zachary Bean  Date:  09/05/2011 10:13 AM    Subjective:  No more chest pain.  For stress test today.   Objective:   Filed Vitals:   09/05/11 0653 09/05/11 0700 09/05/11 0915 09/05/11 1000  BP: 114/76  114/64 130/95  Pulse: 88  68 75  Temp:      TempSrc:      Resp:      Height:      Weight:  182 lb 12.2 oz (82.9 kg)    SpO2:        Intake/Output Summary (Last 24 hours) at 09/05/11 1013 Last data filed at 09/05/11 0900  Gross per 24 hour  Intake      0 ml  Output    425 ml  Net   -425 ml    Tele:  Sinus rhythm.  7 beats NSVT  PHYSICAL EXAM General: no acute distress Neck:  No JVD Lungs: Clear to auscultation on right; absent breath sounds on left  Heart: normal S1, S2;  Regular rate and rhythm, no murmur Abdomen: soft, nontender Extremities: no edema Skin:  warm and dry Psych:  Normal affect  Labs:  [facility] 09/05/11 0219 09/04/11 1549  NA -- 137  K -- 4.1  CL -- 102  CO2 -- --  GLUCOSE -- 112*  BUN -- 17  CREATININE -- 1.20  CALCIUM -- --  MG 2.3 --  PHOS -- --   No results found for this basename: AST:2,ALT:2,ALKPHOS:2,BILITOT:2,PROT:2,ALBUMIN:2 in the last 72 hours No results found for this basename: LIPASE:2,AMYLASE:2 in the last 72 hours  Basename 09/04/11 1549 09/04/11 1507  WBC -- 7.6  NEUTROABS -- --  HGB 14.3 14.2  HCT 42.0 41.9  MCV -- 91.3  PLT -- 232    Basename 09/05/11 0218  CKTOTAL 321*  CKMB 6.6*  TROPONINI <0.30   No components found with this basename: POCBNP:3 No results found for this basename: DDIMER in the last 72 hours No results found for this basename: HGBA1C in the last 72 hours No results found for this basename: CHOL,HDL,LDLCALC,TRIG,CHOLHDL in the last 72 hours No results found for this basename: TSH,T4TOTAL,FREET3,T3FREE,THYROIDAB in the last 72 hours No results found for this basename: VITAMINB12,FOLATE,FERRITIN,TIBC,IRON,RETICCTPCT in the last 72 hours  Radiology/Studies:  Dg Chest 2  View  09/04/2011  *RADIOLOGY REPORT*  Clinical Data: Chest pain  CHEST - 2 VIEW  Comparison: 05/26/2011  Findings: Again noted status post left pneumonectomy with complete opacification of the left hemithorax.  Right lung is clear.  Stable mild degenerative changes thoracic spine.  IMPRESSION: No active disease.  Status post left pneumonectomy.  No significant change  Original Report Authenticated By: [name], M.D.   Assessment and Plan:  Chest Pain:  MI ruled out.  Lexiscan Myoview done today.    -  Lexiscan injected.  Patient tolerated well.  Images Pending.  No EKG changes.  NSVT: E-lytes ok.  Will assess EF with myoview.   DM: Continue oral hypoglycemics.   Lung CA:  Past 15 year survivor with no recurrence. CXR ok   PAF:  S/P ablation with no palpitations and normal ECG today   Signed, Tereso Newcomer, PA-C  10:13 AM 09/05/2011     Will be able to D/C if myovue is normal     Doing well today without symptoms of chest pain/ neck pain. Dr Eden Emms to review myoview later atoday.  Fayrene Fearing Finnley Lewis,MD 11:31 AM 09/05/2011

## 2011-09-05 NOTE — Progress Notes (Signed)
  Echocardiogram 2D Echocardiogram has been performed.  Zachary Bean 09/05/2011, 4:18 PM

## 2011-09-06 MED ORDER — NITROGLYCERIN 0.4 MG SL SUBL: 0.4000 mg | SUBLINGUAL_TABLET | SUBLINGUAL | Status: AC | PRN

## 2011-09-06 NOTE — Progress Notes (Signed)
Progress Note  Name:  Zachary Bean  Today's Date:  09/06/2011 8:00 AM  Admission Date:  09/04/2011  LOS:  2    Subjective:   No chest pain.  No significant dyspnea.     Objective:   Filed Vitals:   09/05/11 1016 09/05/11 1400 09/05/11 2100 09/06/11 0500  BP: 130/70 115/73 100/66 110/68  Pulse: 69 88 81 86  Temp:  97.5 F (36.4 C) 97.9 F (36.6 C) 98.7 F (37.1 C)  TempSrc:  Oral    Resp:  18 18 18   Height:      Weight:      SpO2:  96% 97% 96%    Intake/Output Summary (Last 24 hours) at 09/06/11 0800 Last data filed at 09/05/11 2100  Gross per 24 hour  Intake    120 ml  Output      0 ml  Net    120 ml    Tele:   Sinus rhythm.  PHYSICAL EXAM General: no acute distress Neck:  No JVD at 90 degrees Lungs: Clear to auscultation on right; decreased breath sounds at left base Heart: normal S1, S2;  Regular rate and rhythm, no murmur Abdomen: soft  Extremities: no edema Skin:  warm and dry Psych:  Normal affect  Labs:  [facility] 09/05/11 0219 09/04/11 1549  NA -- 137  K -- 4.1  CL -- 102  CO2 -- --  GLUCOSE -- 112*  BUN -- 17  CREATININE -- 1.20  CALCIUM -- --  MG 2.3 --  PHOS -- --   No results found for this basename: AST:2,ALT:2,ALKPHOS:2,BILITOT:2,PROT:2,ALBUMIN:2 in the last 72 hours No results found for this basename: LIPASE:2,AMYLASE:2 in the last 72 hours  Basename 09/04/11 1549 09/04/11 1507  WBC -- 7.6  NEUTROABS -- --  HGB 14.3 14.2  HCT 42.0 41.9  MCV -- 91.3  PLT -- 232    Basename 09/05/11 1340 09/05/11 1258 09/05/11 0218  CKTOTAL 256* 259* 321*  CKMB 6.1* 6.1* 6.6*  TROPONINI <0.30 <0.30 <0.30   No components found with this basename: POCBNP:3 No results found for this basename: DDIMER in the last 72 hours No results found for this basename: HGBA1C in the last 72 hours No results found for this basename: CHOL,HDL,LDLCALC,TRIG,CHOLHDL in the last 72 hours No results found for this basename: TSH,T4TOTAL,FREET3,T3FREE,THYROIDAB in  the last 72 hours No results found for this basename: VITAMINB12,FOLATE,FERRITIN,TIBC,IRON,RETICCTPCT in the last 72 hours  Radiology/Studies:   Dg Chest 2 View 09/04/2011   IMPRESSION: No active disease.  Status post left pneumonectomy.  No significant change  Original Report Authenticated By: [name], M.D.   Nm Myocar Multi W/spect W/wall Motion / Ef  09/05/2011  IMPRESSION:  1.  No reversible ischemia or infarction. 2.  Normal wall motion. 3.  Left ventricular ejection fraction equal 49 %  Original Report Authenticated By: Genevive Bi, M.D.    2D Echocardiogram 09/05/11:  Study Conclusions  - Left ventricle: The cavity size was normal. Systolic function was normal. The estimated ejection fraction was in the range of 55% to 60%. Wall motion was normal; there were no regional wall motion abnormalities. - Right atrium: The atrium was mildly dilated. - Atrial septum: No defect or patent foramen ovale was identified.   Assessment and Plan:  Chest Pain:  MI ruled out.  Lexiscan Myoview yesterday without ischemia or scar.  EF 49%.  Echo done with normal EF.  ? MSK pain.  Discharge home today after seen by MD.  Follow up  with Dr. Brooklyn Jeff Bean in 2-4 weeks.  NSVT: E-lytes ok.  EF normal by echo.   DM: Continue oral hypoglycemics.   Lung CA:  Past 15 year survivor with no recurrence. S/p pneumonectomy.  CXR ok   PAF:  S/P ablation with no palpitations.  Follow up with Dr. Landry Kamath Bean as outpatient.   Signed, Tereso Newcomer, PA-C  8:00 AM 09/06/2011     Echo reviewed normal EF no RWMA;s  D/C home [name] 10:01 AM 09/06/2011

## 2011-09-21 ENCOUNTER — Encounter: Payer: Self-pay | Admitting: Nurse Practitioner

## 2011-09-21 ENCOUNTER — Ambulatory Visit (INDEPENDENT_AMBULATORY_CARE_PROVIDER_SITE_OTHER): Payer: Medicare Other | Admitting: Nurse Practitioner

## 2011-09-21 DIAGNOSIS — I4729 Other ventricular tachycardia: Secondary | ICD-10-CM

## 2011-09-21 DIAGNOSIS — I472 Ventricular tachycardia: Secondary | ICD-10-CM

## 2011-09-21 DIAGNOSIS — R079 Chest pain, unspecified: Secondary | ICD-10-CM | POA: Insufficient documentation

## 2011-09-21 NOTE — Assessment & Plan Note (Signed)
He remains on beta blocker therapy. No recurrence that he is aware of.

## 2011-09-21 NOTE — Patient Instructions (Signed)
I think you are doing well.  Stay on your current medicines.  Stay active. You may get back to your regular routine  We will see you in 4 months.  Call the [facility] office at [phone] if you have any questions, problems or concerns.

## 2011-09-21 NOTE — Assessment & Plan Note (Signed)
Has had recent overnight admission for chest/left arm/jaw pain. He has had a negative Myoview. His echo showed a normal EF. He remains on beta blocker therapy. He has had no recurrence of symptoms. Have given him the ok to resume his regular activities. If he has any recurrent symptoms, he will need cardiac catheterization. Risk factor modification is encouraged. We will see him back in 4 months. Patient is agreeable to this plan and will call if any problems develop in the interim.

## 2011-09-21 NOTE — Progress Notes (Signed)
Zachary Bean Date of Birth: 1941/01/16 Medical Record #[id number]  History of Present Illness: Zachary Bean is seen today for a post hospital visit. He is seen for Dr. Swazil[place]. He has had a remote history of pericarditis and had a fluoroscopic window. Other issues include PAF, past ablation at [facility], left pneumonectomy, HTN and DM.  He had an overnight admission earlier this month for some left arm, chest and jaw pain. His enzymes were negative. He had a negative Myoview with no scar or ischemia. EF was slightly down but he had a normal echo with an EF of 55 to 60%. . Also noted to have a short run of NSVT. He was continued on his beta blocker.   He comes in today. He is here with his wife. He is doing well. Has had no further symptoms but has not returned to his full activity level yet. Denies chest pain, shortness of breath, palpitations or dizziness. Feels ok on his medicines.   Current Outpatient Prescriptions on File Prior to Visit  Medication Sig Dispense Refill  . aspirin 81 MG tablet Take 81 mg by mouth at bedtime.       . fish oil-omega-3 fatty acids 1000 MG capsule Take 1 g by mouth 2 (two) times daily.      Marland Kitchen glipiZIDE (GLUCOTROL XL) 2.5 MG 24 hr tablet Take 2.5 mg by mouth daily.      Marland Kitchen levothyroxine (SYNTHROID, LEVOTHROID) 100 MCG tablet Take 100 mcg by mouth daily.      Marland Kitchen losartan (COZAAR) 50 MG tablet Take 25 mg by mouth daily.       . metFORMIN (GLUCOPHAGE) 500 MG tablet Take 250 mg by mouth at bedtime.       . metoprolol succinate (TOPROL-XL) 50 MG 24 hr tablet Take 50 mg by mouth daily. Take with or immediately following a meal.      . nitroGLYCERIN (NITROSTAT) 0.4 MG SL tablet Place 1 tablet (0.4 mg total) under the tongue every 5 (five) minutes as needed for chest pain.  25 tablet  2  . ranitidine (ZANTAC) 150 MG tablet Take 150 mg by mouth 2 (two) times daily.      . simvastatin (ZOCOR) 20 MG tablet Take 20 mg by mouth at bedtime.         Allergies  Allergen Reactions  .  Contrast Media (Iodinated Diagnostic Agents) Hives and Itching    Past Medical History  Diagnosis Date  . Acute idiopathic pericarditis 2008    s/p fluoroscopic pericardial window at [facility]  . PAF (paroxysmal atrial fibrillation)     s/p ablation at [facility]  . DM (diabetes mellitus)   . History of lung cancer     s/p left pneumonectomy in 1995; also treated with radiation  . NSVT (nonsustained ventricular tachycardia)   . Normal nuclear stress test 08/2011    EF was slightly down but normal by echo; no ischemia or scar noted.   . Normal echocardiogram 08/2011    Past Surgical History  Procedure Date  . Back surgery 1984  . Pneumonectomy 1995  . Hernia repair 2000    History  Smoking status  . Former Smoker  Smokeless tobacco  . Not on file    History  Alcohol Use No    Family History  Problem Relation Age of Onset  . Heart failure    . Stroke    . Cancer    . Emphysema    . Hypertension    .  Diabetes    . Coronary artery disease    . Stroke Mother   . Heart failure Father     Review of Systems: The review of systems is per the HPI.  All other systems were reviewed and are negative.  Physical Exam: BP 122/72  Pulse 82  Ht 5\' 10"  (1.778 m)  Wt 185 lb (83.915 kg)  BMI 26.54 kg/m2 Patient is very pleasant and in no acute distress. Skin is warm and dry. Color is normal.  HEENT is unremarkable. Normocephalic/atraumatic. PERRL. Sclera are nonicteric. Neck is supple. No masses. No JVD. Lungs are clear, decreased on the left. Cardiac exam shows a regular rate and rhythm. Abdomen is soft. Extremities are without edema. Gait and ROM are intact. No gross neurologic deficits noted.     Lab Results  Component Value Date   WBC 7.6 09/04/2011   HGB 14.3 09/04/2011   HCT 42.0 09/04/2011   PLT 232 09/04/2011   GLUCOSE 112* 09/04/2011   NA 137 09/04/2011   K 4.1 09/04/2011   CL 102 09/04/2011   CREATININE 1.20 09/04/2011   BUN 17 09/04/2011   CARDIOLITE IMPRESSION:  1. No  reversible ischemia or infarction. 2. Normal wall motion. 3. Left ventricular ejection fraction equal 49 %   Echo Study Conclusions  - Left ventricle: The cavity size was normal. Systolic function was normal. The estimated ejection fraction was in the range of 55% to 60%. Wall motion was normal; there were no regional wall motion abnormalities. - Right atrium: The atrium was mildly dilated. - Atrial septum: No defect or patent foramen ovale was identified.     Assessment / Plan:

## 2012-01-21 ENCOUNTER — Ambulatory Visit: Payer: Medicare Other | Admitting: Cardiology

## 2012-02-04 ENCOUNTER — Encounter: Payer: Self-pay | Admitting: Cardiology

## 2012-02-04 ENCOUNTER — Ambulatory Visit (INDEPENDENT_AMBULATORY_CARE_PROVIDER_SITE_OTHER): Payer: Medicare Other | Admitting: Cardiology

## 2012-02-04 VITALS — BP 122/60 | HR 79 | Ht 70.0 in | Wt 182.8 lb

## 2012-02-04 DIAGNOSIS — I480 Paroxysmal atrial fibrillation: Secondary | ICD-10-CM | POA: Insufficient documentation

## 2012-02-04 DIAGNOSIS — I300 Acute nonspecific idiopathic pericarditis: Secondary | ICD-10-CM

## 2012-02-04 DIAGNOSIS — I472 Ventricular tachycardia: Secondary | ICD-10-CM

## 2012-02-04 DIAGNOSIS — I4891 Unspecified atrial fibrillation: Secondary | ICD-10-CM

## 2012-02-04 NOTE — Progress Notes (Signed)
Zachary Bean Date of Birth: 07-Aug-1940 Medical Record #[id number]  History of Present Illness: Zachary Bean is seen today for a followup visit. He was admitted to the hospital this spring with chest pain. He ruled out for myocardial infarction. He had an echocardiogram which was normal. He also had a negative Myoview study. Since that time his chest pain has resolved. He denies any shortness of breath or edema. His only complaint today is of leg cramps at night. His major form of exercises dancing 4 days a week. He is also remodeling a house.   Current Outpatient Prescriptions on File Prior to Visit  Medication Sig Dispense Refill  . aspirin 81 MG tablet Take 81 mg by mouth at bedtime.       . fish oil-omega-3 fatty acids 1000 MG capsule Take 1 g by mouth 2 (two) times daily.      Marland Kitchen glipiZIDE (GLUCOTROL XL) 2.5 MG 24 hr tablet Take 2.5 mg by mouth daily.      Marland Kitchen levothyroxine (SYNTHROID, LEVOTHROID) 100 MCG tablet Take 100 mcg by mouth daily.      Marland Kitchen losartan (COZAAR) 50 MG tablet Take 25 mg by mouth daily.       . metFORMIN (GLUCOPHAGE) 500 MG tablet Take 250 mg by mouth at bedtime.       . metoprolol succinate (TOPROL-XL) 50 MG 24 hr tablet Take 50 mg by mouth daily. Take with or immediately following a meal.      . nitroGLYCERIN (NITROSTAT) 0.4 MG SL tablet Place 1 tablet (0.4 mg total) under the tongue every 5 (five) minutes as needed for chest pain.  25 tablet  2  . ranitidine (ZANTAC) 150 MG tablet Take 150 mg by mouth 2 (two) times daily.      . simvastatin (ZOCOR) 20 MG tablet Take 20 mg by mouth at bedtime.         Allergies  Allergen Reactions  . Contrast Media (Iodinated Diagnostic Agents) Hives and Itching    Past Medical History  Diagnosis Date  . Acute idiopathic pericarditis 2008    s/p fluoroscopic pericardial window at [facility]  . PAF (paroxysmal atrial fibrillation)     s/p ablation at [facility]  . DM (diabetes mellitus)   . History of lung cancer     s/p left pneumonectomy  in 1995; also treated with radiation  . NSVT (nonsustained ventricular tachycardia)   . Normal nuclear stress test 08/2011    EF was slightly down but normal by echo; no ischemia or scar noted.   . Normal echocardiogram 08/2011    Past Surgical History  Procedure Date  . Back surgery 1984  . Pneumonectomy 1995  . Hernia repair 2000    History  Smoking status  . Former Smoker  Smokeless tobacco  . Not on file    History  Alcohol Use No    Family History  Problem Relation Age of Onset  . Heart failure    . Stroke    . Cancer    . Emphysema    . Hypertension    . Diabetes    . Coronary artery disease    . Stroke Mother   . Heart failure Father     Review of Systems: The review of systems is per the HPI.  All other systems were reviewed and are negative.  Physical Exam: BP 122/60  Pulse 79  Ht 5\' 10"  (1.778 m)  Wt 182 lb 12.8 oz (82.918 kg)  BMI 26.23 kg/m2  SpO2 97% Patient is very pleasant and in no acute distress. Skin is warm and dry. Color is normal.  HEENT is unremarkable. Normocephalic/atraumatic. PERRL. Sclera are nonicteric. Neck is supple. No masses. No JVD. Lungs are clear, decreased on the left. Cardiac exam shows a regular rate and rhythm. Abdomen is soft. Extremities are without edema. He has excellent pedal pulses. Gait and ROM are intact. No gross neurologic deficits noted.     Assessment / Plan: 1. History of chest pain. Symptoms have resolved. Extensive cardiac evaluation was unremarkable. I will followup again in one year.  2. History of pericarditis status post pericardial window. No long-term sequela.  3. Paroxysmal atrial fibrillation. Status post ablation. No evidence of recurrence.

## 2012-02-04 NOTE — Patient Instructions (Addendum)
Continue your current therapy  I will see you again in one year.   

## 2012-05-16 ENCOUNTER — Other Ambulatory Visit: Payer: Self-pay | Admitting: Oncology

## 2012-05-16 ENCOUNTER — Telehealth: Payer: Self-pay | Admitting: *Deleted

## 2012-05-16 DIAGNOSIS — C3490 Malignant neoplasm of unspecified part of unspecified bronchus or lung: Secondary | ICD-10-CM

## 2012-05-16 NOTE — Telephone Encounter (Signed)
Patient has called Radiology reporting he was mailed a piece of paper to have an xray before his f/u on 06-02-2012 with Dr. Truett Perna.  After researching, patient has an order for PA/Lat CXR ordered 06-07-2011 in [facility] and was mailed a paper requisition.  [facility] order was cancelled by radiology as a duplicate order request.  Deirdre will call patient and have him bring the requisition form to have this test done before f/u.

## 2012-05-20 ENCOUNTER — Telehealth: Payer: Self-pay | Admitting: Oncology

## 2012-05-20 NOTE — Telephone Encounter (Signed)
called pt and moved his appt per dr Helane Gunther

## 2012-06-02 ENCOUNTER — Ambulatory Visit: Payer: Self-pay | Admitting: Oncology

## 2012-06-17 ENCOUNTER — Other Ambulatory Visit: Payer: Self-pay | Admitting: Oncology

## 2012-06-17 ENCOUNTER — Ambulatory Visit (HOSPITAL_BASED_OUTPATIENT_CLINIC_OR_DEPARTMENT_OTHER): Payer: Worker's Compensation | Admitting: Oncology

## 2012-06-17 ENCOUNTER — Telehealth: Payer: Self-pay | Admitting: Oncology

## 2012-06-17 ENCOUNTER — Ambulatory Visit (HOSPITAL_COMMUNITY)
Admission: RE | Admit: 2012-06-17 | Discharge: 2012-06-17 | Disposition: A | Discharge status: Home / Self Care | Payer: Medicare Other | Source: Ambulatory Visit | Attending: Oncology | Admitting: Oncology

## 2012-06-17 VITALS — BP 118/73 | HR 75 | Temp 97.5°F | Resp 18 | Ht 70.0 in | Wt 184.3 lb

## 2012-06-17 DIAGNOSIS — Z09 Encounter for follow-up examination after completed treatment for conditions other than malignant neoplasm: Secondary | ICD-10-CM

## 2012-06-17 DIAGNOSIS — C3490 Malignant neoplasm of unspecified part of unspecified bronchus or lung: Secondary | ICD-10-CM | POA: Insufficient documentation

## 2012-06-17 DIAGNOSIS — Z85118 Personal history of other malignant neoplasm of bronchus and lung: Secondary | ICD-10-CM

## 2012-06-17 NOTE — Telephone Encounter (Signed)
Gave pt appt for January 2015 and reminded pt of his x-ray before MD visit

## 2012-06-17 NOTE — Progress Notes (Signed)
   [facility]    OFFICE PROGRESS NOTE   INTERVAL HISTORY:   He returns as scheduled. Stable exertional dyspnea. Good appetite and energy level. He had a "cold "after the holidays. No other complaint. He was admitted in April 2013 with chest pain and underwent a negative cardiac evaluation. He is followed by Dr. Swaziland.  Objective:  Vital signs in last 24 hours:  Blood pressure 118/73, pulse 75, temperature 97.5 F (36.4 C), temperature source Oral, resp. rate 18, height 5\' 10"  (1.778 m), weight 184 lb 4.8 oz (83.598 kg).    HEENT: Neck without mass Lymphatics: No cervical, supra-clavicular, or axillary nodes Resp: Decreased breath sounds over the left chest, no respiratory distress Cardio: Regular rate and rhythm GI: No hepatomegaly Vascular: No leg edema  Skin: Left chest scar without evidence of recurrent tumor   X-rays: Chest x-ray 06/17/2012-opacification of the left hemithorax, the right lung is   Medications: I have reviewed the patient's current medications.  Assessment/Plan: 1. Stage III non-small cell lung cancer diagnosed in September 1995. He remains in clinical remission. 2. History of a cough following an ACE inhibitor. 3. Admission to [facility] in February 2008 with atrial fibrillation and a pericardial effusion.  Disposition:  He remains in clinical remission from lung cancer. He would like to continue followup at the cancer Center. Mr. Beveridge will return for an office visit and chest x-ray in one year.   Thornton Papas, MD  06/17/2012  10:35 AM

## 2013-06-13 ENCOUNTER — Ambulatory Visit (HOSPITAL_COMMUNITY)
Admission: RE | Admit: 2013-06-13 | Discharge: 2013-06-13 | Disposition: A | Discharge status: Home / Self Care | Payer: Worker's Compensation | Source: Ambulatory Visit | Attending: Oncology | Admitting: Oncology

## 2013-06-13 DIAGNOSIS — Z09 Encounter for follow-up examination after completed treatment for conditions other than malignant neoplasm: Secondary | ICD-10-CM | POA: Insufficient documentation

## 2013-06-13 DIAGNOSIS — Z85118 Personal history of other malignant neoplasm of bronchus and lung: Secondary | ICD-10-CM | POA: Insufficient documentation

## 2013-06-13 DIAGNOSIS — C3490 Malignant neoplasm of unspecified part of unspecified bronchus or lung: Secondary | ICD-10-CM

## 2013-06-16 ENCOUNTER — Ambulatory Visit (HOSPITAL_BASED_OUTPATIENT_CLINIC_OR_DEPARTMENT_OTHER): Payer: Medicare FFS | Admitting: Oncology

## 2013-06-16 ENCOUNTER — Telehealth: Payer: Self-pay | Admitting: Oncology

## 2013-06-16 VITALS — BP 110/62 | HR 66 | Temp 96.7°F | Resp 18 | Ht 70.0 in | Wt 166.6 lb

## 2013-06-16 DIAGNOSIS — C3490 Malignant neoplasm of unspecified part of unspecified bronchus or lung: Secondary | ICD-10-CM

## 2013-06-16 NOTE — Telephone Encounter (Signed)
gv adn printed appt sched and avs for pt for Jan 2016 °

## 2013-06-16 NOTE — Progress Notes (Signed)
   [facility]    OFFICE PROGRESS NOTE   INTERVAL HISTORY:   Zachary Bean returns for scheduled followup of non-small cell lung cancer. He feels well. Good appetite. He reports an intentional 20 pound weight loss with modifying his diet. He no longer requires diabetes medication. 2-3 weeks ago she had an episode of "spasm "type pain at the left subcostal region. This resolved over minutes. No recurrent pain. He has noted improved exertional dyspnea since losing weight.  Objective:  Vital signs in last 24 hours:  Blood pressure 110/62, pulse 66, temperature 96.7 F (35.9 C), temperature source Oral, resp. rate 18, height 5\' 10"  (1.778 m), weight 166 lb 9.6 oz (75.569 kg).    HEENT: Neck without mass Lymphatics: No cervical, supraclavicular, axillary, or inguinal nodes Resp: Lungs with decreased breath sounds over the left chest, right lung is clear, no respiratory distress Cardio: Regular rate and rhythm GI: No hepatomegaly, nontender, no mass Vascular: No leg edema  X-rays: Chest x-ray 06/13/2013-status post left pneumonectomy, the right lung is clear.   Medications: I have reviewed the patient's current medications.  Assessment/Plan: 1. Stage III non-small cell lung cancer diagnosed in September 1995. He remains in clinical remission. 2. History of a cough following an ACE inhibitor. 3. Admission to [facility] in February 2008 with atrial fibrillation and a pericardial effusion.  Disposition:  Zachary Bean remains in clinical remission from lung cancer. He would like to continue followup at the [facility]. He will return for an office visit and chest x-ray in one year. He will seek medical attention if the left subcostal discomfort returns.   Betsy Coder, MD  06/16/2013  12:04 PM

## 2014-06-18 ENCOUNTER — Other Ambulatory Visit: Payer: Self-pay | Admitting: *Deleted

## 2014-06-18 ENCOUNTER — Ambulatory Visit (HOSPITAL_COMMUNITY)
Admission: RE | Admit: 2014-06-18 | Discharge: 2014-06-18 | Disposition: A | Discharge status: Home / Self Care | Payer: Worker's Compensation | Source: Ambulatory Visit | Attending: Oncology | Admitting: Oncology

## 2014-06-18 ENCOUNTER — Ambulatory Visit (HOSPITAL_BASED_OUTPATIENT_CLINIC_OR_DEPARTMENT_OTHER): Payer: Medicare FFS | Admitting: Oncology

## 2014-06-18 ENCOUNTER — Telehealth: Payer: Self-pay | Admitting: Oncology

## 2014-06-18 VITALS — BP 120/65 | HR 69 | Temp 97.0°F | Resp 18 | Ht 70.0 in | Wt 171.9 lb

## 2014-06-18 DIAGNOSIS — C3400 Malignant neoplasm of unspecified main bronchus: Secondary | ICD-10-CM

## 2014-06-18 DIAGNOSIS — C3492 Malignant neoplasm of unspecified part of left bronchus or lung: Secondary | ICD-10-CM

## 2014-06-18 DIAGNOSIS — Z902 Acquired absence of lung [part of]: Secondary | ICD-10-CM | POA: Diagnosis not present

## 2014-06-18 DIAGNOSIS — Z85118 Personal history of other malignant neoplasm of bronchus and lung: Secondary | ICD-10-CM

## 2014-06-18 NOTE — Progress Notes (Signed)
Call received from [facility] with [facility] Radiology requesting new order for CXR.  Patient denies having cxr on 06-17-2014 and order for cxr was cancelled.  New order entered for cxr for this patient.

## 2014-06-18 NOTE — Telephone Encounter (Signed)
Gave avs & calendar for January 2017.

## 2014-06-18 NOTE — Progress Notes (Signed)
  [place] OFFICE PROGRESS NOTE   Diagnosis: Non-small cell lung cancer  INTERVAL HISTORY:   Zachary Bean returns as scheduled. He has stable exertional dyspnea. No new complaint. Good appetite.  Objective:  Vital signs in last 24 hours:  Blood pressure 120/65, pulse 69, temperature 97 F (36.1 C), temperature source Oral, resp. rate 18, height 5\' 10"  (1.778 m), weight 171 lb 14.4 oz (77.973 kg), SpO2 99 %.    HEENT: Neck without mass Lymphatics: No cervical, supra-clavicular, or axillar nodes Resp: Decreased breath sounds throughout the left chest, no respiratory distress Cardio: Regular rate and rhythm GI: No hepatomegaly Vascular: No leg edema   Imaging:  Dg Chest 2 View  06/18/2014   CLINICAL DATA:  History of lung cancer status post left pneumonectomy. Follow up. No current chest complaints.  EXAM: CHEST  2 VIEW  COMPARISON:  Radiographs 06/13/2013 and 06/17/2012.  FINDINGS: There is stable volume loss in the left hemithorax status post left pneumonectomy. There is mild tracheal deviation to the left and mild herniation of the right upper lobe into the upper left hemithorax. The right lung is clear. There is no pleural effusion or pneumothorax. No acute osseous findings are demonstrated.  IMPRESSION: Stable radiographic appearance the chest status post left pneumonectomy. No acute findings.   Electronically Signed   By: Zachary Bean M.D.   On: 06/18/2014 09:10    Medications: I have reviewed the patient's current medications.  Assessment/Plan: 1. Stage III non-small cell lung cancer diagnosed in September 1995. He remains in clinical remission. 2. History of a cough following an ACE inhibitor. 3. Admission to [facility] in February 2008 with atrial fibrillation and a pericardial effusion.  Disposition:  He remains in clinical remission from non-small cell lung cancer. [name] would like to continue follow-up at the [facility]. He will return  for an office visit and chest x-ray in one year.  Zachary Coder, MD  06/18/2014  9:58 AM

## 2014-10-04 ENCOUNTER — Encounter (HOSPITAL_COMMUNITY): Payer: Self-pay | Admitting: Emergency Medicine

## 2014-10-04 ENCOUNTER — Emergency Department (HOSPITAL_COMMUNITY): Payer: Commercial Managed Care - HMO

## 2014-10-04 ENCOUNTER — Emergency Department (HOSPITAL_COMMUNITY)
Admission: EM | Admit: 2014-10-04 | Discharge: 2014-10-04 | Disposition: A | Discharge status: Home / Self Care | Payer: Commercial Managed Care - HMO | Attending: Emergency Medicine | Admitting: Emergency Medicine

## 2014-10-04 DIAGNOSIS — Z8679 Personal history of other diseases of the circulatory system: Secondary | ICD-10-CM | POA: Diagnosis not present

## 2014-10-04 DIAGNOSIS — R112 Nausea with vomiting, unspecified: Secondary | ICD-10-CM | POA: Diagnosis present

## 2014-10-04 DIAGNOSIS — Z79899 Other long term (current) drug therapy: Secondary | ICD-10-CM | POA: Diagnosis not present

## 2014-10-04 DIAGNOSIS — E119 Type 2 diabetes mellitus without complications: Secondary | ICD-10-CM | POA: Diagnosis not present

## 2014-10-04 DIAGNOSIS — Z853 Personal history of malignant neoplasm of breast: Secondary | ICD-10-CM | POA: Diagnosis not present

## 2014-10-04 DIAGNOSIS — R05 Cough: Secondary | ICD-10-CM | POA: Insufficient documentation

## 2014-10-04 DIAGNOSIS — R1084 Generalized abdominal pain: Secondary | ICD-10-CM | POA: Insufficient documentation

## 2014-10-04 DIAGNOSIS — Z7982 Long term (current) use of aspirin: Secondary | ICD-10-CM | POA: Diagnosis not present

## 2014-10-04 DIAGNOSIS — Z87891 Personal history of nicotine dependence: Secondary | ICD-10-CM | POA: Insufficient documentation

## 2014-10-04 DIAGNOSIS — R109 Unspecified abdominal pain: Secondary | ICD-10-CM

## 2014-10-04 LAB — COMPREHENSIVE METABOLIC PANEL
ALK PHOS: 61 U/L (ref 38–126)
ALT: 48 U/L (ref 17–63)
AST: 59 U/L — ABNORMAL HIGH (ref 15–41)
Albumin: 4.1 g/dL (ref 3.5–5.0)
Anion gap: 10 (ref 5–15)
BUN: 14 mg/dL (ref 6–20)
CO2: 26 mmol/L (ref 22–32)
Calcium: 9.1 mg/dL (ref 8.9–10.3)
Chloride: 88 mmol/L — ABNORMAL LOW (ref 101–111)
Creatinine, Ser: 1.09 mg/dL (ref 0.61–1.24)
GFR calc non Af Amer: >60 mL/min (ref >60)
GLUCOSE: 166 mg/dL — AB (ref 65–99)
POTASSIUM: 5.1 mmol/L (ref 3.5–5.1)
Sodium: 124 mmol/L — ABNORMAL LOW (ref 135–145)
TOTAL PROTEIN: 8.2 g/dL — AB (ref 6.5–8.1)
Total Bilirubin: 1.6 mg/dL — ABNORMAL HIGH (ref 0.3–1.2)

## 2014-10-04 LAB — CBC WITH DIFFERENTIAL/PLATELET
BASOS PCT: 0 % (ref 0–1)
Basophils Absolute: 0 10*3/uL (ref 0.0–0.1)
Eosinophils Absolute: 0 10*3/uL (ref 0.0–0.7)
Eosinophils Relative: 0 % (ref 0–5)
HCT: 40.6 % (ref 39.0–52.0)
Hemoglobin: 14.3 g/dL (ref 13.0–17.0)
LYMPHS PCT: 8 % — AB (ref 12–46)
Lymphs Abs: 1 10*3/uL (ref 0.7–4.0)
MCH: 31 pg (ref 26.0–34.0)
MCHC: 35.2 g/dL (ref 30.0–36.0)
MCV: 88.1 fL (ref 78.0–100.0)
MONO ABS: 0.5 10*3/uL (ref 0.1–1.0)
MONOS PCT: 4 % (ref 3–12)
NEUTROS ABS: 10.2 10*3/uL — AB (ref 1.7–7.7)
NEUTROS PCT: 88 % — AB (ref 43–77)
PLATELETS: 264 10*3/uL (ref 150–400)
RBC: 4.61 MIL/uL (ref 4.22–5.81)
RDW: 11.8 % (ref 11.5–15.5)
WBC: 11.6 10*3/uL — AB (ref 4.0–10.5)

## 2014-10-04 LAB — URINE MICROSCOPIC-ADD ON

## 2014-10-04 LAB — URINALYSIS, ROUTINE W REFLEX MICROSCOPIC
BILIRUBIN URINE: NEGATIVE
Glucose, UA: 250 mg/dL — AB
Ketones, ur: 15 mg/dL — AB
LEUKOCYTES UA: NEGATIVE
Nitrite: NEGATIVE
Protein, ur: 100 mg/dL — AB
Specific Gravity, Urine: 1.014 (ref 1.005–1.030)
Urobilinogen, UA: 1 mg/dL (ref 0.0–1.0)
pH: 6 (ref 5.0–8.0)

## 2014-10-04 LAB — LIPASE, BLOOD: LIPASE: 25 U/L (ref 22–51)

## 2014-10-04 LAB — TROPONIN I: Troponin I: <0.03 ng/mL (ref <0.031)

## 2014-10-04 LAB — I-STAT CG4 LACTIC ACID, ED: LACTIC ACID, VENOUS: 1.7 mmol/L (ref 0.5–2.0)

## 2014-10-04 MED ORDER — ONDANSETRON 8 MG PO TBDP: 8.0000 mg | ORAL_TABLET | Freq: Three times a day (TID) | ORAL | Status: DC | PRN

## 2014-10-04 MED ORDER — PANTOPRAZOLE SODIUM 20 MG PO TBEC: 20.0000 mg | DELAYED_RELEASE_TABLET | Freq: Every day | ORAL | Status: DC

## 2014-10-04 MED ORDER — ONDANSETRON HCL 4 MG/2ML IJ SOLN
4.0000 mg | Freq: Once | INTRAMUSCULAR | Status: AC
  Administered 2014-10-04: 4 mg via INTRAVENOUS
  Filled 2014-10-04: qty 2

## 2014-10-04 MED ORDER — SODIUM CHLORIDE 0.9 % IV BOLUS (SEPSIS)
500.0000 mL | Freq: Once | INTRAVENOUS | Status: AC
  Administered 2014-10-04: 500 mL via INTRAVENOUS

## 2014-10-04 MED ORDER — BARIUM SULFATE 2.1 % PO SUSP
ORAL | Status: AC
Start: 2014-10-04 — End: 2014-10-04
  Administered 2014-10-04: 450 mL via ORAL

## 2014-10-04 MED ORDER — METOCLOPRAMIDE HCL 5 MG/ML IJ SOLN
10.0000 mg | Freq: Once | INTRAMUSCULAR | Status: AC
  Administered 2014-10-04: 10 mg via INTRAVENOUS
  Filled 2014-10-04: qty 2

## 2014-10-04 MED ORDER — PENICILLIN V POTASSIUM 500 MG PO TABS: 500.0000 mg | ORAL_TABLET | Freq: Three times a day (TID) | ORAL | Status: AC

## 2014-10-04 NOTE — ED Notes (Signed)
Patient's wife given hot chocolate and blanket per request.

## 2014-10-04 NOTE — ED Provider Notes (Signed)
CSN: [id number]     Arrival date & time 10/04/14  0630 History   First MD Initiated Contact with Patient 10/04/14 0940     Chief Complaint  Patient presents with  . Cough  . Emesis     (Consider location/radiation/quality/duration/timing/severity/associated sxs/prior Treatment) HPI Comments: Patient presents to the emergency department for evaluation of nausea and vomiting. Patient reports that he has been sick for a week. He started with sore throat and sinus drainage, cough and chest congestion. Was seen by his primary doctor 3 days ago and started on antibiotics. He only took 2 pills which "tore up his stomach". Since then he has had nausea and vomiting. He has not been able to hold anything down. There has not been any diarrhea. Patient has had intermittent episodes of abdominal pain and cramping.  Patient is a 74 y.o. male presenting with cough and vomiting.  Cough Emesis Associated symptoms: abdominal pain     Past Medical History  Diagnosis Date  . Acute idiopathic pericarditis 2008    s/p fluoroscopic pericardial window at [facility]  . PAF (paroxysmal atrial fibrillation)     s/p ablation at [facility]  . DM (diabetes mellitus)   . History of lung cancer     s/p left pneumonectomy in 1995; also treated with radiation  . NSVT (nonsustained ventricular tachycardia)   . Normal nuclear stress test 08/2011    EF was slightly down but normal by echo; no ischemia or scar noted.   . Normal echocardiogram 08/2011   Past Surgical History  Procedure Laterality Date  . Back surgery  1984  . Pneumonectomy  1995  . Hernia repair  2000   Family History  Problem Relation Age of Onset  . Heart failure    . Stroke    . Cancer    . Emphysema    . Hypertension    . Diabetes    . Coronary artery disease    . Stroke Mother   . Heart failure Father    History  Substance Use Topics  . Smoking status: Former Research scientist (life sciences)  . Smokeless tobacco: Not on file  . Alcohol Use: No    Review of Systems   Respiratory: Positive for cough.   Gastrointestinal: Positive for nausea, vomiting and abdominal pain.  All other systems reviewed and are negative.     Allergies  Contrast media  Home Medications   Prior to Admission medications   Medication Sig Start Date End Date Taking? Authorizing Provider  AFLURIA PRESERVATIVE FREE injection  03/29/12   Historical Provider, MD  aspirin 81 MG tablet Take 81 mg by mouth at bedtime.     Historical Provider, MD  levothyroxine (SYNTHROID, LEVOTHROID) 125 MCG tablet Take 125 mcg by mouth daily. 04/12/14   Historical Provider, MD  losartan (COZAAR) 50 MG tablet Take 25 mg by mouth daily.  06/01/11   Historical Provider, MD  metoprolol (LOPRESSOR) 50 MG tablet Take 50 mg by mouth daily. 06/12/13   Historical Provider, MD  Multiple Vitamin (MULTIVITAMIN) tablet Take 1 tablet by mouth daily.    Historical Provider, MD  nitroGLYCERIN (NITROSTAT) 0.4 MG SL tablet Place 1 tablet (0.4 mg total) under the tongue every 5 (five) minutes as needed for chest pain. 09/06/11 09/05/12  Liliane Shi, PA-C  ranitidine (ZANTAC) 150 MG tablet Take 150 mg by mouth daily.     Historical Provider, MD   BP 162/75 mmHg  Pulse 85  Temp(Src) 97.7 F (36.5 C) (Oral)  Resp 18  SpO2 97% Physical Exam  Constitutional: He is oriented to person, place, and time. He appears well-developed and well-nourished. No distress.  HENT:  Head: Normocephalic and atraumatic.  Right Ear: Hearing normal.  Left Ear: Hearing normal.  Nose: Nose normal.  Mouth/Throat: Oropharynx is clear and moist and mucous membranes are normal.  Eyes: Conjunctivae and EOM are normal. Pupils are equal, round, and reactive to light.  Neck: Normal range of motion. Neck supple.  Cardiovascular: Regular rhythm, S1 normal and S2 normal.  Exam reveals no gallop and no friction rub.   No murmur heard. Pulmonary/Chest: Effort normal and breath sounds normal. No respiratory distress. He exhibits no tenderness.   Abdominal: Soft. Normal appearance and bowel sounds are normal. There is no hepatosplenomegaly. There is generalized tenderness. There is no rebound, no guarding, no tenderness at McBurney's point and negative Murphy's sign. No hernia.  Musculoskeletal: Normal range of motion.  Neurological: He is alert and oriented to person, place, and time. He has normal strength. No cranial nerve deficit or sensory deficit. Coordination normal. GCS eye subscore is 4. GCS verbal subscore is 5. GCS motor subscore is 6.  Skin: Skin is warm, dry and intact. No rash noted. No cyanosis.  Psychiatric: He has a normal mood and affect. His speech is normal and behavior is normal. Thought content normal.  Nursing note and vitals reviewed.   ED Course  Procedures (including critical care time) Labs Review Labs Reviewed - No data to display  Imaging Review No results found.   EKG Interpretation None      MDM   Final diagnoses:  None   nausea and vomiting  Upper respiratory infection  Patient presents to the ER primarily for nausea and vomiting with abdominal discomfort. Patient has been sick for a week, however, with upper respiratory infection symptoms. He was started on antibiotics by his primary doctor, thinks that it is the cause of his nausea and vomiting. He took 2 pills before symptoms began. Patient reports he has not been able to eat or drink because of the vomiting. He had diffuse mild tenderness on examination, no signs of peritonitis. Lab work was unremarkable. Cardiac workup was unremarkable. Chest x-ray does not show pneumonia. Patient will undergo CT scan of abdomen and pelvis. Will sign out to oncoming ER physician to follow-up. Anticipate discharge with normal CT scan.    Orpah Greek, MD 10/05/14 [phone]

## 2014-10-04 NOTE — Discharge Instructions (Signed)

## 2014-10-04 NOTE — ED Notes (Addendum)
Cough and sore throat and sinus drainage given antibiotics on Monday by MD; now vomiting and feeling unwell. Denies diarr. Denies fevers, chest pain, SOB. Pt has had left lung removed due to cancer. Vomiting x 6 in past 24 hours.

## 2014-10-04 NOTE — ED Notes (Signed)
Dr. Betsey Holiday at bedside with patient.

## 2014-10-04 NOTE — ED Provider Notes (Signed)
Discussed the CT scan findings with the patient.   No acute findings noted.  Pt does not recall what abx he was on.  He states his doctor started him on something for strep throat.  It was not pcn.  Will change to pcn abx.  Rx zofran and antacid for nausea and the burning sensation in his abdomen.  Follow up with PCP.  At this time there does not appear to be any evidence of an acute emergency medical condition and the patient appears stable for discharge with appropriate outpatient follow up.   Dorie Rank, MD 10/04/14 [phone]

## 2014-10-04 NOTE — ED Notes (Signed)
Patient transported to X-ray 

## 2015-06-18 ENCOUNTER — Ambulatory Visit (HOSPITAL_COMMUNITY)
Admission: RE | Admit: 2015-06-18 | Discharge: 2015-06-18 | Disposition: A | Discharge status: Home / Self Care | Payer: Worker's Compensation | Source: Ambulatory Visit | Attending: Oncology | Admitting: Oncology

## 2015-06-18 ENCOUNTER — Telehealth: Payer: Self-pay | Admitting: Oncology

## 2015-06-18 ENCOUNTER — Ambulatory Visit (HOSPITAL_BASED_OUTPATIENT_CLINIC_OR_DEPARTMENT_OTHER): Payer: Worker's Compensation | Admitting: Oncology

## 2015-06-18 VITALS — BP 104/60 | HR 69 | Temp 97.5°F | Resp 18 | Ht 70.0 in | Wt 171.4 lb

## 2015-06-18 DIAGNOSIS — Z902 Acquired absence of lung [part of]: Secondary | ICD-10-CM | POA: Diagnosis not present

## 2015-06-18 DIAGNOSIS — R0602 Shortness of breath: Secondary | ICD-10-CM | POA: Insufficient documentation

## 2015-06-18 DIAGNOSIS — R05 Cough: Secondary | ICD-10-CM | POA: Diagnosis not present

## 2015-06-18 DIAGNOSIS — Z85118 Personal history of other malignant neoplasm of bronchus and lung: Secondary | ICD-10-CM | POA: Diagnosis not present

## 2015-06-18 DIAGNOSIS — C3492 Malignant neoplasm of unspecified part of left bronchus or lung: Secondary | ICD-10-CM | POA: Insufficient documentation

## 2015-06-18 DIAGNOSIS — R918 Other nonspecific abnormal finding of lung field: Secondary | ICD-10-CM | POA: Insufficient documentation

## 2015-06-18 NOTE — Progress Notes (Signed)
  [facility] OFFICE PROGRESS NOTE   Diagnosis: Non-small cell lung cancer  INTERVAL HISTORY:   Mr. Entwistle returns as scheduled. He feels intermittent "tingling" in various areas. He has noted some worsening of chronic exertional dyspnea. Good appetite. He dances several days per week.  Objective:  Vital signs in last 24 hours:  Blood pressure 104/60, pulse 69, temperature 97.5 F (36.4 C), temperature source Oral, resp. rate 18, height '5\' 10"'$  (1.778 m), weight 171 lb 6.4 oz (77.747 kg), SpO2 99 %.    HEENT: Neck without mass Lymphatics: No cervical, supra-clavicular, or axillary nodes Resp: Decreased breath sounds throughout the left chest, no respiratory distress, in inspiratory rhonchi at the posterior chest bilaterally Cardio: Regular rate and rhythm GI: No hepatomegaly Vascular: No leg edema  Skin: Left thoracotomy scar without evidence of recurrent tumor   Portacath/PICC-without erythema  Lab Results:  Lab Results  Component Value Date   WBC 11.6* 10/04/2014   HGB 14.3 10/04/2014   HCT 40.6 10/04/2014   MCV 88.1 10/04/2014   PLT 264 10/04/2014   NEUTROABS 10.2* 10/04/2014    Lab Results  Component Value Date   NA 124* 10/04/2014    No results found for: CEA  Imaging:  Dg Chest 2 View  06/18/2015  CLINICAL DATA:  History of lung cancer. Chronic shortness of breath and coughing. EXAM: CHEST  2 VIEW COMPARISON:  10/04/2014 FINDINGS: Examination demonstrates evidence of patient's left pneumonectomy with opacification of the left hemithorax and cardiomediastinal shift to the left as these findings are unchanged. Surgical clips are present over the left hilar region. Right lung is adequately inflated and otherwise clear. There are mild degenerate changes of the spine. There is calcified plaque over the aortic arch. IMPRESSION: No acute findings. Stable complete opacification of the left hemithorax compatible previous left pneumonectomy for lung cancer.  Electronically Signed   By: Marin Olp M.D.   On: 06/18/2015 09:10    Medications: I have reviewed the patient's current medications.  Assessment/Plan: 1. Stage III non-small cell lung cancer diagnosed in September 1995. He remains in clinical remission. 2. History of a cough following an ACE inhibitor. 3. Admission to [facility] in February 2008 with atrial fibrillation and a pericardial effusion.    Disposition:  Mr. Sweetser remains in remission from lung cancer. He would like to continue follow-up at the [facility]. He will return for an office visit and chest x-ray in one year.  Betsy Coder, MD  06/18/2015  2:31 PM

## 2015-06-18 NOTE — Telephone Encounter (Signed)
per pof to sch pt appt-gave pt copy of avs °

## 2015-07-08 ENCOUNTER — Emergency Department (HOSPITAL_COMMUNITY): Payer: PPO

## 2015-07-08 ENCOUNTER — Encounter (HOSPITAL_COMMUNITY): Payer: Self-pay | Admitting: Emergency Medicine

## 2015-07-08 DIAGNOSIS — R079 Chest pain, unspecified: Principal | ICD-10-CM | POA: Insufficient documentation

## 2015-07-08 DIAGNOSIS — I10 Essential (primary) hypertension: Secondary | ICD-10-CM | POA: Diagnosis not present

## 2015-07-08 DIAGNOSIS — Z7982 Long term (current) use of aspirin: Secondary | ICD-10-CM | POA: Insufficient documentation

## 2015-07-08 DIAGNOSIS — Z91041 Radiographic dye allergy status: Secondary | ICD-10-CM | POA: Diagnosis not present

## 2015-07-08 DIAGNOSIS — I480 Paroxysmal atrial fibrillation: Secondary | ICD-10-CM | POA: Diagnosis not present

## 2015-07-08 DIAGNOSIS — N179 Acute kidney failure, unspecified: Secondary | ICD-10-CM | POA: Insufficient documentation

## 2015-07-08 DIAGNOSIS — Z87891 Personal history of nicotine dependence: Secondary | ICD-10-CM | POA: Insufficient documentation

## 2015-07-08 DIAGNOSIS — I200 Unstable angina: Secondary | ICD-10-CM | POA: Insufficient documentation

## 2015-07-08 DIAGNOSIS — E039 Hypothyroidism, unspecified: Secondary | ICD-10-CM | POA: Insufficient documentation

## 2015-07-08 DIAGNOSIS — Z85118 Personal history of other malignant neoplasm of bronchus and lung: Secondary | ICD-10-CM | POA: Diagnosis not present

## 2015-07-08 DIAGNOSIS — E1165 Type 2 diabetes mellitus with hyperglycemia: Secondary | ICD-10-CM | POA: Insufficient documentation

## 2015-07-08 LAB — CBC
HCT: 41.2 % (ref 39.0–52.0)
Hemoglobin: 13.9 g/dL (ref 13.0–17.0)
MCH: 31 pg (ref 26.0–34.0)
MCHC: 33.7 g/dL (ref 30.0–36.0)
MCV: 92 fL (ref 78.0–100.0)
PLATELETS: 242 10*3/uL (ref 150–400)
RBC: 4.48 MIL/uL (ref 4.22–5.81)
RDW: 12 % (ref 11.5–15.5)
WBC: 7.5 10*3/uL (ref 4.0–10.5)

## 2015-07-08 LAB — BASIC METABOLIC PANEL
Anion gap: 12 (ref 5–15)
BUN: 20 mg/dL (ref 6–20)
CALCIUM: 9.5 mg/dL (ref 8.9–10.3)
CO2: 24 mmol/L (ref 22–32)
CREATININE: 1.35 mg/dL — AB (ref 0.61–1.24)
Chloride: 94 mmol/L — ABNORMAL LOW (ref 101–111)
GFR calc non Af Amer: 51 mL/min — ABNORMAL LOW (ref >60)
GFR, EST AFRICAN AMERICAN: 59 mL/min — AB (ref >60)
Glucose, Bld: 287 mg/dL — ABNORMAL HIGH (ref 65–99)
Potassium: 4.1 mmol/L (ref 3.5–5.1)
SODIUM: 130 mmol/L — AB (ref 135–145)

## 2015-07-08 LAB — I-STAT TROPONIN, ED: Troponin i, poc: 0.01 ng/mL (ref 0.00–0.08)

## 2015-07-08 NOTE — ED Notes (Signed)
Pt. reports left chest pain radiating to left neck and left arm onset this evening with SOB and nausea.

## 2015-07-09 ENCOUNTER — Encounter (HOSPITAL_COMMUNITY)
Admission: EM | Disposition: A | Discharge status: Home / Self Care | Payer: Self-pay | Source: Home / Self Care | Attending: Emergency Medicine

## 2015-07-09 ENCOUNTER — Observation Stay (HOSPITAL_COMMUNITY)
Admission: EM | Admit: 2015-07-09 | Discharge: 2015-07-10 | Disposition: A | Discharge status: Home / Self Care | Payer: PPO | Attending: Internal Medicine | Admitting: Internal Medicine

## 2015-07-09 ENCOUNTER — Encounter (HOSPITAL_COMMUNITY): Payer: Self-pay | Admitting: Internal Medicine

## 2015-07-09 DIAGNOSIS — Z8679 Personal history of other diseases of the circulatory system: Secondary | ICD-10-CM | POA: Diagnosis not present

## 2015-07-09 DIAGNOSIS — E1165 Type 2 diabetes mellitus with hyperglycemia: Secondary | ICD-10-CM

## 2015-07-09 DIAGNOSIS — I10 Essential (primary) hypertension: Secondary | ICD-10-CM | POA: Diagnosis present

## 2015-07-09 DIAGNOSIS — R079 Chest pain, unspecified: Secondary | ICD-10-CM | POA: Insufficient documentation

## 2015-07-09 DIAGNOSIS — I480 Paroxysmal atrial fibrillation: Secondary | ICD-10-CM | POA: Diagnosis present

## 2015-07-09 DIAGNOSIS — N179 Acute kidney failure, unspecified: Secondary | ICD-10-CM | POA: Diagnosis present

## 2015-07-09 DIAGNOSIS — I200 Unstable angina: Secondary | ICD-10-CM | POA: Diagnosis not present

## 2015-07-09 DIAGNOSIS — E039 Hypothyroidism, unspecified: Secondary | ICD-10-CM | POA: Diagnosis present

## 2015-07-09 HISTORY — DX: Headache, unspecified: R51.9

## 2015-07-09 HISTORY — DX: Type 2 diabetes mellitus without complications: E11.9

## 2015-07-09 HISTORY — DX: Gastro-esophageal reflux disease without esophagitis: K21.9

## 2015-07-09 HISTORY — DX: Trigger finger, unspecified finger: M65.30

## 2015-07-09 HISTORY — DX: Chronic sinusitis, unspecified: J32.9

## 2015-07-09 HISTORY — DX: Low back pain: M54.5

## 2015-07-09 HISTORY — PX: CARDIAC CATHETERIZATION: SHX172

## 2015-07-09 HISTORY — DX: Other chronic pain: G89.29

## 2015-07-09 HISTORY — DX: Chronic duodenal ulcer without hemorrhage or perforation: K26.7

## 2015-07-09 HISTORY — DX: Low back pain, unspecified: M54.50

## 2015-07-09 HISTORY — DX: Malignant neoplasm of unspecified part of unspecified bronchus or lung: C34.90

## 2015-07-09 HISTORY — DX: Hypothyroidism, unspecified: E03.9

## 2015-07-09 HISTORY — DX: Unspecified asthma, uncomplicated: J45.909

## 2015-07-09 HISTORY — DX: Headache: R51

## 2015-07-09 HISTORY — DX: Hyperlipidemia, unspecified: E78.5

## 2015-07-09 LAB — CBC
HCT: 41.2 % (ref 39.0–52.0)
Hemoglobin: 14.1 g/dL (ref 13.0–17.0)
MCH: 31.7 pg (ref 26.0–34.0)
MCHC: 34.2 g/dL (ref 30.0–36.0)
MCV: 92.6 fL (ref 78.0–100.0)
PLATELETS: 203 10*3/uL (ref 150–400)
RBC: 4.45 MIL/uL (ref 4.22–5.81)
RDW: 12.2 % (ref 11.5–15.5)
WBC: 9.4 10*3/uL (ref 4.0–10.5)

## 2015-07-09 LAB — TROPONIN I: Troponin I: <0.03 ng/mL (ref <0.031)

## 2015-07-09 LAB — GLUCOSE, CAPILLARY
GLUCOSE-CAPILLARY: 117 mg/dL — AB (ref 65–99)
Glucose-Capillary: 167 mg/dL — ABNORMAL HIGH (ref 65–99)
Glucose-Capillary: 220 mg/dL — ABNORMAL HIGH (ref 65–99)

## 2015-07-09 LAB — CREATININE, SERUM
CREATININE: 1.12 mg/dL (ref 0.61–1.24)
GFR calc Af Amer: >60 mL/min (ref >60)
GFR calc non Af Amer: >60 mL/min (ref >60)

## 2015-07-09 LAB — D-DIMER, QUANTITATIVE: D-Dimer, Quant: 0.59 ug/mL-FEU — ABNORMAL HIGH (ref 0.00–0.50)

## 2015-07-09 LAB — PROTIME-INR
INR: 1.11 (ref 0.00–1.49)
PROTHROMBIN TIME: 14.5 s (ref 11.6–15.2)

## 2015-07-09 SURGERY — LEFT HEART CATH AND CORONARY ANGIOGRAPHY

## 2015-07-09 MED ORDER — ADULT MULTIVITAMIN W/MINERALS CH
1.0000 | ORAL_TABLET | Freq: Every day | ORAL | Status: DC
  Administered 2015-07-09 – 2015-07-10 (×2): 1 via ORAL
  Filled 2015-07-09 (×2): qty 1

## 2015-07-09 MED ORDER — LIDOCAINE HCL (PF) 1 % IJ SOLN
INTRAMUSCULAR | Status: AC
  Filled 2015-07-09: qty 30

## 2015-07-09 MED ORDER — ONDANSETRON HCL 4 MG/2ML IJ SOLN: 4.0000 mg | Freq: Four times a day (QID) | INTRAMUSCULAR | Status: DC | PRN

## 2015-07-09 MED ORDER — ACETAMINOPHEN 325 MG PO TABS: 650.0000 mg | ORAL_TABLET | ORAL | Status: DC | PRN

## 2015-07-09 MED ORDER — MIDAZOLAM HCL 2 MG/2ML IJ SOLN
INTRAMUSCULAR | Status: DC | PRN
  Administered 2015-07-09: 1 mg via INTRAVENOUS

## 2015-07-09 MED ORDER — SODIUM CHLORIDE 0.9 % IV SOLN
INTRAVENOUS | Status: DC
  Administered 2015-07-09: 11:00:00 via INTRAVENOUS

## 2015-07-09 MED ORDER — SODIUM CHLORIDE 0.9% FLUSH
3.0000 mL | Freq: Two times a day (BID) | INTRAVENOUS | Status: DC
Start: 2015-07-09 — End: 2015-07-10
  Administered 2015-07-09: 3 mL via INTRAVENOUS

## 2015-07-09 MED ORDER — SODIUM CHLORIDE 0.9% FLUSH: 3.0000 mL | INTRAVENOUS | Status: DC | PRN

## 2015-07-09 MED ORDER — HEPARIN SODIUM (PORCINE) 1000 UNIT/ML IJ SOLN
INTRAMUSCULAR | Status: AC
  Filled 2015-07-09: qty 1

## 2015-07-09 MED ORDER — LOSARTAN POTASSIUM 50 MG PO TABS
25.0000 mg | ORAL_TABLET | Freq: Every day | ORAL | Status: DC
Start: 2015-07-09 — End: 2015-07-10
  Administered 2015-07-09 – 2015-07-10 (×2): 25 mg via ORAL
  Filled 2015-07-09 (×2): qty 1

## 2015-07-09 MED ORDER — SODIUM CHLORIDE 0.9 % WEIGHT BASED INFUSION: 3.0000 mL/kg/h | INTRAVENOUS | Status: AC

## 2015-07-09 MED ORDER — LEVOTHYROXINE SODIUM 125 MCG PO TABS
125.0000 ug | ORAL_TABLET | Freq: Every day | ORAL | Status: DC
  Administered 2015-07-10: 125 ug via ORAL
  Filled 2015-07-09: qty 1

## 2015-07-09 MED ORDER — HEPARIN SODIUM (PORCINE) 1000 UNIT/ML IJ SOLN
INTRAMUSCULAR | Status: DC | PRN
  Administered 2015-07-09: 4000 [IU] via INTRAVENOUS

## 2015-07-09 MED ORDER — SODIUM CHLORIDE 0.9 % IV SOLN: 250.0000 mL | INTRAVENOUS | Status: DC | PRN

## 2015-07-09 MED ORDER — SODIUM CHLORIDE 0.9 % IV BOLUS (SEPSIS)
250.0000 mL | Freq: Once | INTRAVENOUS | Status: AC
  Administered 2015-07-09: 250 mL via INTRAVENOUS

## 2015-07-09 MED ORDER — OXYCODONE-ACETAMINOPHEN 5-325 MG PO TABS: 1.0000 | ORAL_TABLET | ORAL | Status: DC | PRN

## 2015-07-09 MED ORDER — INSULIN ASPART 100 UNIT/ML ~~LOC~~ SOLN
0.0000 [IU] | SUBCUTANEOUS | Status: DC
Start: 2015-07-09 — End: 2015-07-09
  Administered 2015-07-09: 17:00:00 2 [IU] via SUBCUTANEOUS

## 2015-07-09 MED ORDER — LIDOCAINE HCL (PF) 1 % IJ SOLN
INTRAMUSCULAR | Status: DC | PRN
  Administered 2015-07-09: 2 mL via INTRADERMAL

## 2015-07-09 MED ORDER — INSULIN ASPART 100 UNIT/ML ~~LOC~~ SOLN
0.0000 [IU] | Freq: Three times a day (TID) | SUBCUTANEOUS | Status: DC
  Administered 2015-07-10: 07:00:00 2 [IU] via SUBCUTANEOUS

## 2015-07-09 MED ORDER — IOHEXOL 350 MG/ML SOLN
INTRAVENOUS | Status: DC | PRN
  Administered 2015-07-09: 60 mL via INTRA_ARTERIAL

## 2015-07-09 MED ORDER — ASPIRIN 81 MG PO TABS: 81.0000 mg | ORAL_TABLET | Freq: Every day | ORAL | Status: DC

## 2015-07-09 MED ORDER — ASPIRIN EC 325 MG PO TBEC
325.0000 mg | DELAYED_RELEASE_TABLET | Freq: Every day | ORAL | Status: DC
  Administered 2015-07-09 – 2015-07-10 (×2): 325 mg via ORAL
  Filled 2015-07-09 (×2): qty 1

## 2015-07-09 MED ORDER — MIDAZOLAM HCL 2 MG/2ML IJ SOLN
INTRAMUSCULAR | Status: AC
  Filled 2015-07-09: qty 2

## 2015-07-09 MED ORDER — VERAPAMIL HCL 2.5 MG/ML IV SOLN
INTRAVENOUS | Status: DC | PRN
  Administered 2015-07-09: 10 mL via INTRA_ARTERIAL

## 2015-07-09 MED ORDER — VERAPAMIL HCL 2.5 MG/ML IV SOLN
INTRAVENOUS | Status: AC
  Filled 2015-07-09: qty 2

## 2015-07-09 MED ORDER — NITROGLYCERIN 0.4 MG SL SUBL
0.4000 mg | SUBLINGUAL_TABLET | SUBLINGUAL | Status: DC | PRN
  Administered 2015-07-09: 0.4 mg via SUBLINGUAL
  Filled 2015-07-09: qty 1

## 2015-07-09 MED ORDER — HEPARIN SODIUM (PORCINE) 5000 UNIT/ML IJ SOLN
5000.0000 [IU] | Freq: Three times a day (TID) | INTRAMUSCULAR | Status: DC
  Administered 2015-07-10: 5000 [IU] via SUBCUTANEOUS
  Filled 2015-07-09: qty 1

## 2015-07-09 MED ORDER — NITROGLYCERIN 0.4 MG SL SUBL: 0.4000 mg | SUBLINGUAL_TABLET | SUBLINGUAL | Status: DC | PRN

## 2015-07-09 MED ORDER — FENTANYL CITRATE (PF) 100 MCG/2ML IJ SOLN
INTRAMUSCULAR | Status: DC | PRN
  Administered 2015-07-09: 50 ug via INTRAVENOUS

## 2015-07-09 MED ORDER — DIPHENHYDRAMINE HCL 50 MG/ML IJ SOLN
25.0000 mg | Freq: Once | INTRAMUSCULAR | Status: AC
  Administered 2015-07-09: 25 mg via INTRAVENOUS
  Filled 2015-07-09: qty 1

## 2015-07-09 MED ORDER — FAMOTIDINE IN NACL 20-0.9 MG/50ML-% IV SOLN
20.0000 mg | Freq: Once | INTRAVENOUS | Status: AC
  Administered 2015-07-09: 20 mg via INTRAVENOUS
  Filled 2015-07-09: qty 50

## 2015-07-09 MED ORDER — FENTANYL CITRATE (PF) 100 MCG/2ML IJ SOLN
INTRAMUSCULAR | Status: AC
  Filled 2015-07-09: qty 2

## 2015-07-09 MED ORDER — HEPARIN (PORCINE) IN NACL 2-0.9 UNIT/ML-% IJ SOLN
INTRAMUSCULAR | Status: DC | PRN
  Administered 2015-07-09: 1500 mL

## 2015-07-09 MED ORDER — METOPROLOL TARTRATE 25 MG PO TABS
50.0000 mg | ORAL_TABLET | Freq: Every day | ORAL | Status: DC
  Administered 2015-07-09 – 2015-07-10 (×2): 50 mg via ORAL
  Filled 2015-07-09 (×2): qty 2

## 2015-07-09 MED ORDER — METHYLPREDNISOLONE SODIUM SUCC 125 MG IJ SOLR
125.0000 mg | Freq: Once | INTRAMUSCULAR | Status: AC
  Administered 2015-07-09: 125 mg via INTRAVENOUS
  Filled 2015-07-09: qty 2

## 2015-07-09 MED ORDER — SODIUM CHLORIDE 0.9 % IV SOLN
INTRAVENOUS | Status: DC
  Administered 2015-07-09: 75 mL/h via INTRAVENOUS

## 2015-07-09 MED ORDER — HEPARIN (PORCINE) IN NACL 2-0.9 UNIT/ML-% IJ SOLN
INTRAMUSCULAR | Status: AC
  Filled 2015-07-09: qty 1000

## 2015-07-09 MED ORDER — FAMOTIDINE 20 MG PO TABS
20.0000 mg | ORAL_TABLET | Freq: Two times a day (BID) | ORAL | Status: DC
  Administered 2015-07-09 – 2015-07-10 (×2): 20 mg via ORAL
  Filled 2015-07-09 (×2): qty 1

## 2015-07-09 MED ORDER — MORPHINE SULFATE (PF) 2 MG/ML IV SOLN: 2.0000 mg | INTRAVENOUS | Status: DC | PRN

## 2015-07-09 SURGICAL SUPPLY — 9 items
CATH INFINITI 5 FR JL3.5 (CATHETERS) ×3 IMPLANT
CATH INFINITI JR4 5F (CATHETERS) ×3 IMPLANT
DEVICE RAD COMP TR BAND LRG (VASCULAR PRODUCTS) ×3 IMPLANT
GLIDESHEATH SLEND A-KIT 6F 22G (SHEATH) ×3 IMPLANT
KIT HEART LEFT (KITS) ×3 IMPLANT
PACK CARDIAC CATHETERIZATION (CUSTOM PROCEDURE TRAY) ×3 IMPLANT
TRANSDUCER W/STOPCOCK (MISCELLANEOUS) ×3 IMPLANT
TUBING CIL FLEX 10 FLL-RA (TUBING) ×3 IMPLANT
WIRE SAFE-T 1.5MM-J .035X260CM (WIRE) ×3 IMPLANT

## 2015-07-09 NOTE — Interval H&P Note (Signed)
Cath Lab Visit (complete for each Cath Lab visit)  Clinical Evaluation Leading to the Procedure:   ACS: No.  Non-ACS:    Anginal Classification: CCS III  Anti-ischemic medical therapy: Minimal Therapy (1 class of medications)  Non-Invasive Test Results: No non-invasive testing performed  Prior CABG: No previous CABG      History and Physical Interval Note:  07/09/2015 11:23 AM  Zachary Bean  has presented today for surgery, with the diagnosis of cp  The various methods of treatment have been discussed with the patient and family. After consideration of risks, benefits and other options for treatment, the patient has consented to  Procedure(s): Left Heart Cath and Coronary Angiography (N/A) as a surgical intervention .  The patient's history has been reviewed, patient examined, no change in status, stable for surgery.  I have reviewed the patient's chart and labs.  Questions were answered to the patient's satisfaction.     Sinclair Grooms

## 2015-07-09 NOTE — H&P (Signed)
[facility] History and Physical  Zachary Bean KXF:[id number] DOB: 11-25-1940 DOA: 07/09/2015  Referring physician: Dr. Tawanna Sat. PCP: Lilian Coma, MD  Specialists: None.  Chief Complaint: Chest pain.  HPI: Zachary Bean is a 75 y.o. male with history of hypertension, diabetes mellitus type 2, previous history of pericarditis requiring pericardial window in 2008, previous history of paroxysmal atrial fibrillation status post ablation at [facility] presents to the ER because of chest pain. Patient started developing chest pain last night around 9 PM. Pain was mostly in the left anterior chest wall relating to the left arm. Chest pain is more like pressure lying with no associated shortness of breath had some nausea denies any abdominal pain. Chest pain improved and resolved after sublingual nitroglycerin. Presently chest pain-free. Patient has had a stress test in 2013 which was unremarkable. Patient has been admitted for further workup. Patient has hyperglycemia and has not been taking his diabetic medication for last 2 years.   Review of Systems: As presented in the history of presenting illness, rest negative.  Past Medical History  Diagnosis Date  . Acute idiopathic pericarditis 2008    s/p fluoroscopic pericardial window at [facility]  . PAF (paroxysmal atrial fibrillation) ([place])     s/p ablation at [facility]  . DM (diabetes mellitus) ([place])   . History of lung cancer     s/p left pneumonectomy in 1995; also treated with radiation  . NSVT (nonsustained ventricular tachycardia) ([place])   . Normal nuclear stress test 08/2011    EF was slightly down but normal by echo; no ischemia or scar noted.   . Normal echocardiogram 08/2011  . Cancer [facility])    Past Surgical History  Procedure Laterality Date  . Back surgery  1984  . Pneumonectomy  1995  . Hernia repair  2000   Social History:  reports that he has quit smoking. He does not have any smokeless tobacco history on file. He reports that he  does not drink alcohol or use illicit drugs. Where does patient live at home. Can patient participate in ADLs? Yes.  Allergies  Allergen Reactions  . Contrast Media [Iodinated Diagnostic Agents] Hives and Itching    Family History:  Family History  Problem Relation Age of Onset  . Heart failure    . Stroke    . Cancer    . Emphysema    . Hypertension    . Diabetes    . Coronary artery disease    . Stroke Mother   . Heart failure Father       Prior to Admission medications   Medication Sig Start Date End Date Taking? Authorizing Provider  aspirin 81 MG tablet Take 81 mg by mouth at bedtime.    Yes Historical Provider, MD  levothyroxine (SYNTHROID, LEVOTHROID) 125 MCG tablet Take 125 mcg by mouth daily. 04/12/14  Yes Historical Provider, MD  losartan (COZAAR) 50 MG tablet Take 25 mg by mouth daily.  06/01/11  Yes Historical Provider, MD  metoprolol (LOPRESSOR) 50 MG tablet Take 50 mg by mouth daily. 06/12/13  Yes Historical Provider, MD  Multiple Vitamin (MULTIVITAMIN) tablet Take 1 tablet by mouth daily.   Yes Historical Provider, MD  nitroGLYCERIN (NITROSTAT) 0.4 MG SL tablet Place 1 tablet (0.4 mg total) under the tongue every 5 (five) minutes as needed for chest pain. 09/06/11 07/09/15 Yes Scott T Weaver, PA-C  ranitidine (ZANTAC) 150 MG tablet Take 150 mg by mouth daily.    Yes Historical Provider, MD  ondansetron (ZOFRAN ODT)  8 MG disintegrating tablet Take 1 tablet (8 mg total) by mouth every 8 (eight) hours as needed for nausea or vomiting. Patient not taking: Reported on 07/09/2015 10/04/14   Dorie Rank, MD  pantoprazole (PROTONIX) 20 MG tablet Take 1 tablet (20 mg total) by mouth daily. Patient not taking: Reported on 06/18/2015 10/04/14   Dorie Rank, MD    Physical Exam: Filed Vitals:   07/09/15 0430 07/09/15 0445 07/09/15 0500 07/09/15 0515  BP: 99/61 101/64 106/63 103/69  Pulse: 72 69 64 63  Temp:      TempSrc:      Resp: '17 17 16 14  '$ SpO2: 98% 99% 97% 98%      General:  Moderately built and nourished.  Eyes: Anicteric no pallor.  ENT: No discharge from the ears eyes nose or mouth.  Neck: No mass felt. No JVD appreciated.  Cardiovascular: S1-S2 heard.  Respiratory: No rhonchi or crepitations.  Abdomen: Soft nontender bowel sounds present. No guarding or rigidity.  Skin: No rash.  Musculoskeletal: No edema.  Psychiatric: Appears normal.  Neurologic: Alert awake oriented to time place and person. Moves all extremities.  Labs on Admission:  Basic Metabolic Panel:  Recent Labs Lab 07/08/15 2309  NA 130*  K 4.1  CL 94*  CO2 24  GLUCOSE 287*  BUN 20  CREATININE 1.35*  CALCIUM 9.5   Liver Function Tests: No results for input(s): AST, ALT, ALKPHOS, BILITOT, PROT, ALBUMIN in the last 168 hours. No results for input(s): LIPASE, AMYLASE in the last 168 hours. No results for input(s): AMMONIA in the last 168 hours. CBC:  Recent Labs Lab 07/08/15 2309  WBC 7.5  HGB 13.9  HCT 41.2  MCV 92.0  PLT 242   Cardiac Enzymes: No results for input(s): CKTOTAL, CKMB, CKMBINDEX, TROPONINI in the last 168 hours.  BNP (last 3 results) No results for input(s): BNP in the last 8760 hours.  ProBNP (last 3 results) No results for input(s): PROBNP in the last 8760 hours.  CBG: No results for input(s): GLUCAP in the last 168 hours.  Radiological Exams on Admission: Dg Chest 2 View  07/08/2015  CLINICAL DATA:  75 year old male with left-sided chest pain EXAM: CHEST  2 VIEW COMPARISON:  Radiograph dated 06/18/2015 FINDINGS: Stable postsurgical changes of left pneumonectomy with complete opacification of the left hemithorax similar to prior study. There is mild shift of the mediastinum to the left. The right lung is clear. No pneumothorax. The osseous structures are grossly unremarkable. IMPRESSION: Stable postsurgical changes of left pneumonectomy with complete opacification of the left thorax. Electronically Signed   By: Anner Crete M.D.   On: 07/08/2015 23:34    EKG: Independently reviewed. Normal sinus rhythm with nonspecific changes in inferior leads.  Assessment/Plan Principal Problem:   Chest pain Active Problems:   PAF (paroxysmal atrial fibrillation) (HCC)   Type 2 diabetes mellitus with hyperglycemia (HCC)   History of pericarditis   Hypothyroidism   Hypertension   1. Chest pain - concerning for angina. At this time we will keep patient nothing by mouth in anticipation of procedure. Cycle cardiac markers check 2-D echo. Patient has had previous history of pericarditis requiring pericardial window. Stressed as in 2013 was unremarkable. Cardiology consult has been requested. Aspirin. When necessary nitroglycerin. Will check d-dimer. 2. Hypertension - continue metoprolol. 3. Diabetes mellitus type 2 with hyperglycemia - patient has not been taking his medications for last 2 years. Check hemoglobin A1c. For now I have placed patient on sliding  scale coverage. Patient will need definite antidiabetic medications at discharge. 4. Hypothyroidism on Synthroid. 5. History of lung cancer in remission. 6. History of paroxysmal atrial fibrillation status post ablation.   DVT Prophylaxis SCDs until we get 2-D echo to rule out pericardial effusion.  Code Status: Full code.  Family Communication: Discussed with patient.  Disposition Plan: Admit for observation.    Lamine Laton N. [facility] Pager [phone].  If 7PM-7AM, please contact night-coverage [email or web address] Password [email or web address] 07/09/2015, 6:10 AM

## 2015-07-09 NOTE — Progress Notes (Signed)
TR BAND REMOVAL  LOCATION:    right radial  DEFLATED PER PROTOCOL:    Yes.    TIME BAND OFF / DRESSING APPLIED:    1730   SITE UPON ARRIVAL:    Level 0  SITE AFTER BAND REMOVAL:    Level 0  CIRCULATION SENSATION AND MOVEMENT:    Within Normal Limits   Yes.    COMMENTS:   Tolerated procedure well

## 2015-07-09 NOTE — Consult Note (Addendum)
Cardiologist:  [place] Reason for Consult:  Chest Pain Referring Physician:  Dr. Broadus John, Jacinta Shoe.  TRH   HPI:  Armon Orvis is an 75 y.o. male with a remote history of pericarditis and a fluoroscopic window(2008) as well as PAF, ablation at [facility], lung cancer, left pneumonectomy, HTN and DM. He was admitted in 2013 with left arm, chest and jaw pain. His enzymes were negative. He had a negative Myoview with no scar or ischemia. EF was slightly down but he had a normal echo with an EF of 55 to 60%. . Also noted to have a short run of NSVT. He was continued on his beta blocker.   We have not seen Mr. Harbaugh in 4 years.  He presents with chest pain.  Patient reports 10 out of 10 sharp, chest pain radiating to his left arm and jaw which started at 2130 hrs. last night. He took an aspirin when he was at home.  When he got to the emergency room, he was given a sublingual nitroglycerin which started to ease the pain. He also reports some nausea, chills and lightheadedness.  He says he is currently pain-free. The patient currently denies vomiting, fever, shortness of breath beyond baseline, orthopnea, PND, cough, congestion, abdominal pain, hematochezia, melena, lower extremity edema, claudication.   Past Medical History  Diagnosis Date  . Acute idiopathic pericarditis 2008    s/p fluoroscopic pericardial window at [facility]  . PAF (paroxysmal atrial fibrillation) ([place])     s/p ablation at [facility]  . DM (diabetes mellitus) ([place])   . History of lung cancer     s/p left pneumonectomy in 1995; also treated with radiation  . NSVT (nonsustained ventricular tachycardia) ([place])   . Normal nuclear stress test 08/2011    EF was slightly down but normal by echo; no ischemia or scar noted.   . Normal echocardiogram 08/2011  . Cancer [facility])     Past Surgical History  Procedure Laterality Date  . Back surgery  1984  . Pneumonectomy  1995  . Hernia repair  2000    Family History  Problem Relation Age of Onset    . Heart failure    . Stroke    . Cancer    . Emphysema    . Hypertension    . Diabetes    . Coronary artery disease    . Stroke Mother   . Heart failure Father     Social History:  reports that he has quit smoking. He does not have any smokeless tobacco history on file. He reports that he does not drink alcohol or use illicit drugs.  Allergies:  Allergies  Allergen Reactions  . Contrast Media [Iodinated Diagnostic Agents] Hives and Itching    Medications: Medication Sig  aspirin 81 MG tablet Take 81 mg by mouth at bedtime.   levothyroxine (SYNTHROID, LEVOTHROID) 125 MCG tablet Take 125 mcg by mouth daily.  losartan (COZAAR) 50 MG tablet Take 25 mg by mouth daily.   metoprolol (LOPRESSOR) 50 MG tablet Take 50 mg by mouth daily.  Multiple Vitamin (MULTIVITAMIN) tablet Take 1 tablet by mouth daily.  nitroGLYCERIN (NITROSTAT) 0.4 MG SL tablet Place 1 tablet (0.4 mg total) under the tongue every 5 (five) minutes as needed for chest pain.  ranitidine (ZANTAC) 150 MG tablet Take 150 mg by mouth daily.   ondansetron (ZOFRAN ODT) 8 MG disintegrating tablet Take 1 tablet (8 mg total) by mouth every 8 (eight) hours as needed for nausea or vomiting.  Patient not taking: Reported on 07/09/2015  pantoprazole (PROTONIX) 20 MG tablet Take 1 tablet (20 mg total) by mouth daily. Patient not taking: Reported on 06/18/2015     Results for orders placed or performed during the hospital encounter of 07/09/15 (from the past 48 hour(s))  Basic metabolic panel     Status: Abnormal   Collection Time: 07/08/15 11:09 PM  Result Value Ref Range   Sodium 130 (L) 135 - 145 mmol/L   Potassium 4.1 3.5 - 5.1 mmol/L   Chloride 94 (L) 101 - 111 mmol/L   CO2 24 22 - 32 mmol/L   Glucose, Bld 287 (H) 65 - 99 mg/dL   BUN 20 6 - 20 mg/dL   Creatinine, Ser 1.35 (H) 0.61 - 1.24 mg/dL   Calcium 9.5 8.9 - 10.3 mg/dL   GFR calc non Af Amer 51 (L) >60 mL/min   GFR calc Af Amer 59 (L) >60 mL/min    Comment:  (NOTE) The eGFR has been calculated using the CKD EPI equation. This calculation has not been validated in all clinical situations. eGFR's persistently <60 mL/min signify possible Chronic Kidney Disease.    Anion gap 12 5 - 15  CBC     Status: None   Collection Time: 07/08/15 11:09 PM  Result Value Ref Range   WBC 7.5 4.0 - 10.5 K/uL   RBC 4.48 4.22 - 5.81 MIL/uL   Hemoglobin 13.9 13.0 - 17.0 g/dL   HCT 41.2 39.0 - 52.0 %   MCV 92.0 78.0 - 100.0 fL   MCH 31.0 26.0 - 34.0 pg   MCHC 33.7 30.0 - 36.0 g/dL   RDW 12.0 11.5 - 15.5 %   Platelets 242 150 - 400 K/uL  I-stat troponin, ED (not at [facility])     Status: None   Collection Time: 07/08/15 11:31 PM  Result Value Ref Range   Troponin i, poc 0.01 0.00 - 0.08 ng/mL   Comment 3            Comment: Due to the release kinetics of cTnI, a negative result within the first hours of the onset of symptoms does not rule out myocardial infarction with certainty. If myocardial infarction is still suspected, repeat the test at appropriate intervals.   D-dimer, quantitative (not at [facility])     Status: Abnormal   Collection Time: 07/09/15  7:56 AM  Result Value Ref Range   D-Dimer, Quant 0.59 (H) 0.00 - 0.50 ug/mL-FEU    Comment: (NOTE) At the manufacturer cut-off of 0.50 ug/mL FEU, this assay has been documented to exclude PE with a sensitivity and negative predictive value of 97 to 99%.  At this time, this assay has not been approved by the FDA to exclude DVT/VTE. Results should be correlated with clinical presentation.     Dg Chest 2 View  07/08/2015  CLINICAL DATA:  75 year old male with left-sided chest pain EXAM: CHEST  2 VIEW COMPARISON:  Radiograph dated 06/18/2015 FINDINGS: Stable postsurgical changes of left pneumonectomy with complete opacification of the left hemithorax similar to prior study. There is mild shift of the mediastinum to the left. The right lung is clear. No pneumothorax. The osseous structures are grossly  unremarkable. IMPRESSION: Stable postsurgical changes of left pneumonectomy with complete opacification of the left thorax. Electronically Signed   By: Anner Crete M.D.   On: 07/08/2015 23:34    Review of Systems  Constitutional: Positive for chills. Negative for fever.  HENT: Negative for congestion and sore  throat.   Respiratory: Negative for cough and shortness of breath.   Cardiovascular: Positive for chest pain. Negative for palpitations, orthopnea and leg swelling.  Gastrointestinal: Positive for nausea. Negative for vomiting, abdominal pain, blood in stool and melena.  Genitourinary: Negative for hematuria.  Musculoskeletal: Positive for neck pain.  Neurological: Positive for dizziness. Negative for weakness.  All other systems reviewed and are negative.  Blood pressure 115/73, pulse 73, temperature 97.6 F (36.4 C), temperature source Oral, resp. rate 16, SpO2 100 %. Physical Exam  Nursing note and vitals reviewed. Constitutional: He appears well-developed and well-nourished. No distress.  HENT:  Head: Normocephalic and atraumatic.  Eyes: Pupils are equal, round, and reactive to light. No scleral icterus.  Neck: Normal range of motion. Neck supple. No JVD present.  Cardiovascular: Normal rate, regular rhythm, S1 normal and S2 normal.   No murmur heard. Pulses:      Radial pulses are 2+ on the right side, and 2+ on the left side.       Dorsalis pedis pulses are 2+ on the right side, and 2+ on the left side.  Respiratory: Effort normal. He has no wheezes. He has no rales.  No breath sounds left side  GI: Soft. Bowel sounds are normal. He exhibits no distension. There is no tenderness.  Musculoskeletal: He exhibits edema (trace lower extremity edema).  Lymphadenopathy:    He has no cervical adenopathy.  Neurological: He is alert. No cranial nerve deficit. He exhibits normal muscle tone.  Psychiatric: He has a normal mood and affect.    Assessment/Plan: Principal  Problem:   Chest pain Active Problems:   PAF (paroxysmal atrial fibrillation) (H[facility])   Type 2 diabetes mellitus with hyperglycemia ([place])   History of pericarditis   Hypothyroidism   Hypertension   ARF (acute renal failure) ([place])  Mr. Amor chest pain is concerning for unstable angina with classic radiation to the left arm and neck. It appears to have been responsive to nitroglycerin. His initial troponin is negative. His EKG shows no acute ischemic changes. His d-dimer is elevated at 0.59.  He does have S1 every 3 T3 on EKG however, patient is not tachycardic nor tachypneic.  These changes were seen on prior EKG.  May need VQ scan.  After discussion with Dr. Ellyn Hack, we are planning on left heart catheterization for definitive assessment of his coronary arteries today.  Blood pressures controlled and a little hypotensive.  Continue aspirin.  Home meds include metoprolol 50 mg daily, Cozaar 50 mg daily.  Hold Cozaar continue metoprolol.  Check lipids.  Add statin.  Echocardiogram is already been ordered by primary team.  Tarri Fuller, PA-C  07/09/2015, 10:15 AM    I saw evaluated Mr. Matsuo along with Tarri Fuller, PA-C. We performed the interview and examination together. We then discussed our findings and recommendations. 75 year old gentleman with "diet-controlled" diabetes, former smoker with history of lung cancer who now presents with symptoms that with the exception of the duration are classic for unstable angina. EKG is relatively nonspecific, and initial troponin is negative. However with left-sided chest pressure/tightness radiating to the jaw and left arm exacerbated with walking to the car, and likely alleviated with nitroglycerin, these are classic features. Only atypical feature is the duration without positive troponins.   We discussed potential option of stress test versus cardiac catheterization, however it isn't my opinion that his best course of action will be to proceed with  cardiac catheterization for definitive evaluation. He has a mildly elevated  d-dimer level, but does not have sinus tachycardia and is currently chest pain-free and not dyspneic. This would make PE less likely.  Plan is to proceed with cardiac catheterization today. I agree with continuing beta blocker and old ARB for cath, restart post cath. Continue risk factor evaluation and adjustment with lipid panel and start a statin.  He has an echocardiogram ordered which makes sense given his history of pericarditis.  Dispo pending results of cardiac catheterization.   Procedure:  LEFT HEART CATHETERIZATION WITH NATIVE CORONARY ANGIOGRAPHY AND POSSIBLE PERCUTANEOUS CORONARY INTERVENTION  The procedure with Risks/Benefits/Alternatives and Indications was reviewed with the patient.  All questions were answered.    Risks / Complications include, but not limited to: Death, MI, CVA/TIA, VF/VT (with defibrillation), Bradycardia (need for temporary pacer placement), contrast induced nephropathy, bleeding / bruising / hematoma / pseudoaneurysm, vascular or coronary injury (with possible emergent CT or Vascular Surgery), adverse medication reactions, infection.  Additional risks involving the use of radiation with the possibility of radiation burns and cancer were explained in detail.  The patient voices understanding and agrees to proceed.    Orders written.   Thank you for allowing [place] to see the care of this very pleasant gentleman    Floria Brandau, Leonie Green, M.D., M.S. Interventional Cardiologist   Pager # [phone] Phone # [phone] [address] [place], [place] [address]

## 2015-07-09 NOTE — Progress Notes (Addendum)
Pt seen and examined, admitted this am by dr.K 74/M w/ DM, HTN, P.Afib with chest pain concerning for unstable angina -now chest pain free, EKG unchanged, Troponin negative so far For LHC today, appreciate Cards input  Domenic Polite, MD

## 2015-07-09 NOTE — ED Provider Notes (Signed)
CSN: [id number]     Arrival date & time 07/08/15  2250 History  By signing my name below, I, Rowan Blase, attest that this documentation has been prepared under the direction and in the presence of Orpah Greek, MD . Electronically Signed: Rowan Blase, Scribe. 07/09/2015. 4:02 AM.   Chief Complaint  Patient presents with  . Chest Pain   The history is provided by the patient. No language interpreter was used.   HPI Comments:  Zachary Bean is a 75 y.o. male with PMHx of DM, lung cancer, pneumonectomy, and NSVT who presents to the Emergency Department complaining of gradual onset, mild chest pain beginning ~2130 yesterday while sitting on the cough. Pt reports associated headache and jaw pain; he also notes left leg pain which is currently alleviated. He states the chest pain is not aggravated by walking. Pt notes he took 5 baby asprin PTA. Pt reports his father had a heart attack at 67. Pt denies nausea, diaphoresis, or abnormal shortness of breath. Pt denies past heart attack or blockage.   Past Medical History  Diagnosis Date  . Acute idiopathic pericarditis 2008    s/p fluoroscopic pericardial window at [facility]  . PAF (paroxysmal atrial fibrillation) ([place])     s/p ablation at [facility]  . DM (diabetes mellitus) ([place])   . History of lung cancer     s/p left pneumonectomy in 1995; also treated with radiation  . NSVT (nonsustained ventricular tachycardia) ([place])   . Normal nuclear stress test 08/2011    EF was slightly down but normal by echo; no ischemia or scar noted.   . Normal echocardiogram 08/2011  . Cancer [facility])    Past Surgical History  Procedure Laterality Date  . Back surgery  1984  . Pneumonectomy  1995  . Hernia repair  2000   Family History  Problem Relation Age of Onset  . Heart failure    . Stroke    . Cancer    . Emphysema    . Hypertension    . Diabetes    . Coronary artery disease    . Stroke Mother   . Heart failure Father    Social History   Substance Use Topics  . Smoking status: Former Research scientist (life sciences)  . Smokeless tobacco: None  . Alcohol Use: No    Review of Systems  Constitutional: Negative for diaphoresis.  Respiratory: Negative for shortness of breath.   Cardiovascular: Positive for chest pain.  Gastrointestinal: Negative for nausea.  Musculoskeletal: Positive for arthralgias (Jaw).  Neurological: Positive for headaches.  All other systems reviewed and are negative.   Allergies  Contrast media  Home Medications   Prior to Admission medications   Medication Sig Start Date End Date Taking? Authorizing Provider  AFLURIA PRESERVATIVE FREE injection  03/29/12   Historical Provider, MD  aspirin 81 MG tablet Take 81 mg by mouth at bedtime.     Historical Provider, MD  levothyroxine (SYNTHROID, LEVOTHROID) 125 MCG tablet Take 125 mcg by mouth daily. 04/12/14   Historical Provider, MD  losartan (COZAAR) 50 MG tablet Take 25 mg by mouth daily.  06/01/11   Historical Provider, MD  metoprolol (LOPRESSOR) 50 MG tablet Take 50 mg by mouth daily. 06/12/13   Historical Provider, MD  Multiple Vitamin (MULTIVITAMIN) tablet Take 1 tablet by mouth daily.    Historical Provider, MD  nitroGLYCERIN (NITROSTAT) 0.4 MG SL tablet Place 1 tablet (0.4 mg total) under the tongue every 5 (five) minutes as needed for chest pain.  Patient not taking: Reported on 10/04/2014 09/06/11 09/05/12  Liliane Shi, PA-C  ondansetron (ZOFRAN ODT) 8 MG disintegrating tablet Take 1 tablet (8 mg total) by mouth every 8 (eight) hours as needed for nausea or vomiting. 10/04/14   Dorie Rank, MD  pantoprazole (PROTONIX) 20 MG tablet Take 1 tablet (20 mg total) by mouth daily. Patient not taking: Reported on 06/18/2015 10/04/14   Dorie Rank, MD  ranitidine (ZANTAC) 150 MG tablet Take 150 mg by mouth daily.     Historical Provider, MD   BP 108/69 mmHg  Pulse 73  Temp(Src) 97.6 F (36.4 C) (Oral)  Resp 15  SpO2 99% Physical Exam  Constitutional: He is oriented to person,  place, and time. He appears well-developed and well-nourished. No distress.  HENT:  Head: Normocephalic and atraumatic.  Right Ear: Hearing normal.  Left Ear: Hearing normal.  Nose: Nose normal.  Mouth/Throat: Oropharynx is clear and moist and mucous membranes are normal.  Eyes: Conjunctivae and EOM are normal. Pupils are equal, round, and reactive to light.  Neck: Normal range of motion. Neck supple.  Cardiovascular: Regular rhythm, S1 normal and S2 normal.  Exam reveals no gallop and no friction rub.   No murmur heard. Pulmonary/Chest: Effort normal and breath sounds normal. No respiratory distress. He exhibits no tenderness.  Abdominal: Soft. Normal appearance and bowel sounds are normal. There is no hepatosplenomegaly. There is no tenderness. There is no rebound, no guarding, no tenderness at McBurney's point and negative Murphy's sign. No hernia.  Musculoskeletal: Normal range of motion.  Neurological: He is alert and oriented to person, place, and time. He has normal strength. No cranial nerve deficit or sensory deficit. Coordination normal. GCS eye subscore is 4. GCS verbal subscore is 5. GCS motor subscore is 6.  Skin: Skin is warm, dry and intact. No rash noted. No cyanosis.  Psychiatric: He has a normal mood and affect. His speech is normal and behavior is normal. Thought content normal.  Nursing note and vitals reviewed.   ED Course  Procedures  DIAGNOSTIC STUDIES:  Oxygen Saturation is 99% on RA, normal by my interpretation.    COORDINATION OF CARE:  3:06 AM Will administer nitroglycerin and admit for observation. Discussed treatment plan with pt at bedside and pt agreed to plan.  Labs Review Labs Reviewed  BASIC METABOLIC PANEL - Abnormal; Notable for the following:    Sodium 130 (*)    Chloride 94 (*)    Glucose, Bld 287 (*)    Creatinine, Ser 1.35 (*)    GFR calc non Af Amer 51 (*)    GFR calc Af Amer 59 (*)    All other components within normal limits  CBC   I-STAT TROPOININ, ED    Imaging Review Dg Chest 2 View  07/08/2015  CLINICAL DATA:  75 year old male with left-sided chest pain EXAM: CHEST  2 VIEW COMPARISON:  Radiograph dated 06/18/2015 FINDINGS: Stable postsurgical changes of left pneumonectomy with complete opacification of the left hemithorax similar to prior study. There is mild shift of the mediastinum to the left. The right lung is clear. No pneumothorax. The osseous structures are grossly unremarkable. IMPRESSION: Stable postsurgical changes of left pneumonectomy with complete opacification of the left thorax. Electronically Signed   By: Anner Crete M.D.   On: 07/08/2015 23:34   I have personally reviewed and evaluated these images and lab results as part of my medical decision-making.   EKG Interpretation   Date/Time:  Monday July 08 2015  22:56:02 EST Ventricular Rate:  80 PR Interval:  144 QRS Duration: 82 QT Interval:  358 QTC Calculation: 412 R Axis:   56 Text Interpretation:  Normal sinus rhythm Possible Inferior infarct , age  undetermined Abnormal ECG Confirmed by Jeroline Wolbert  MD, Adarsh Mundorf ([id number]) on  07/09/2015 2:52:28 AM      MDM   Final diagnoses:  None   chest pain  Patient presents to the ER for evaluation of chest pain. Patient reports that he had onset of pain in the left side of his chest that radiated to the left arm, left side of neck and jaw. He had associated nausea and mild shortness of breath. Patient reports symptoms have eased off, he now has residual aching in the jaw, no chest pain. He does not have any known coronary artery disease. EKG with T-wave inversions in inferior leads, no ST depression or elevation. Initial troponin negative.  Patient had taken 5 low-dose aspirin prior to arrival in the ER. He was given nitroglycerin and had resolution of the residual discomfort. HEART score is 4. Will hospitalize patient for further evaluation and cardiac rule out.  I personally performed the  services described in this documentation, which was scribed in my presence. The recorded information has been reviewed and is accurate.    Orpah Greek, MD 07/09/15 [phone]

## 2015-07-10 ENCOUNTER — Observation Stay (HOSPITAL_BASED_OUTPATIENT_CLINIC_OR_DEPARTMENT_OTHER): Payer: PPO

## 2015-07-10 DIAGNOSIS — N179 Acute kidney failure, unspecified: Secondary | ICD-10-CM

## 2015-07-10 DIAGNOSIS — R079 Chest pain, unspecified: Secondary | ICD-10-CM

## 2015-07-10 DIAGNOSIS — I200 Unstable angina: Secondary | ICD-10-CM | POA: Diagnosis not present

## 2015-07-10 DIAGNOSIS — Z8679 Personal history of other diseases of the circulatory system: Secondary | ICD-10-CM | POA: Diagnosis not present

## 2015-07-10 DIAGNOSIS — I10 Essential (primary) hypertension: Secondary | ICD-10-CM | POA: Diagnosis not present

## 2015-07-10 DIAGNOSIS — E1165 Type 2 diabetes mellitus with hyperglycemia: Secondary | ICD-10-CM | POA: Diagnosis not present

## 2015-07-10 LAB — BASIC METABOLIC PANEL
ANION GAP: 6 (ref 5–15)
BUN: 20 mg/dL (ref 6–20)
CALCIUM: 9.1 mg/dL (ref 8.9–10.3)
CO2: 26 mmol/L (ref 22–32)
CREATININE: 1.25 mg/dL — AB (ref 0.61–1.24)
Chloride: 101 mmol/L (ref 101–111)
GFR, EST NON AFRICAN AMERICAN: 56 mL/min — AB (ref >60)
GLUCOSE: 195 mg/dL — AB (ref 65–99)
Potassium: 4.5 mmol/L (ref 3.5–5.1)
Sodium: 133 mmol/L — ABNORMAL LOW (ref 135–145)

## 2015-07-10 LAB — GLUCOSE, CAPILLARY
GLUCOSE-CAPILLARY: 158 mg/dL — AB (ref 65–99)
Glucose-Capillary: 109 mg/dL — ABNORMAL HIGH (ref 65–99)

## 2015-07-10 LAB — TROPONIN I: Troponin I: <0.03 ng/mL (ref <0.031)

## 2015-07-10 LAB — HEMOGLOBIN A1C
HEMOGLOBIN A1C: 7 % — AB (ref 4.8–5.6)
MEAN PLASMA GLUCOSE: 154 mg/dL

## 2015-07-10 NOTE — Progress Notes (Signed)
Patient Name: Zachary Bean Date of Encounter: 07/10/2015.  75 y.o. male with a remote history of pericarditis and a fluoroscopic window(2008) as well as PAF, ablation at [facility], lung cancer, left pneumonectomy, HTN and DM. He was admitted in 2013 with left arm, chest and jaw pain. His enzymes were negative. He had a negative Myoview with no scar or ischemia. EF was slightly down but he had a normal echo with an EF of 55 to 60%. He was admitted 12/14 with SSx concerning for Unstable Angina -- recommended Cardiac Cath --> non-obstructive CAD.  Meaning his Sx are now LOW RISK for Cardiac Etiology   Hospital Problem List     Principal Problem:   Chest pain with high risk of acute coronary syndrome Active Problems:   Unstable angina (HCC)   PAF (paroxysmal atrial fibrillation) (HCC)   Type 2 diabetes mellitus with hyperglycemia (HCC)   History of pericarditis   Hypothyroidism   Essential hypertension   ARF (acute renal failure) (HCC)    Subjective   Feels good, no further pain. Happy about cath results  Inpatient Medications    . aspirin EC  325 mg Oral Daily  . famotidine  20 mg Oral BID  . heparin  5,000 Units Subcutaneous 3 times per day  . insulin aspart  0-9 Units Subcutaneous TID WC  . levothyroxine  125 mcg Oral QAC breakfast  . losartan  25 mg Oral Daily  . metoprolol  50 mg Oral Daily  . multivitamin with minerals  1 tablet Oral Daily  . sodium chloride flush  3 mL Intravenous Q12H    Vital Signs    Filed Vitals:   07/09/15 2000 07/09/15 2232 07/10/15 0642 07/10/15 0744  BP:  102/72 106/55 107/57  Pulse: 88 81 79 83  Temp:  97.9 F (36.6 C) 97.6 F (36.4 C) 98 F (36.7 C)  TempSrc:  Oral Oral Oral  Resp: '17 24 19 24  '$ Weight:   78 kg (171 lb 15.3 oz)   SpO2: 96% 97% 96% 96%    Intake/Output Summary (Last 24 hours) at 07/10/15 0904 Last data filed at 07/10/15 0630  Gross per 24 hour  Intake 1299.6 ml  Output   1400 ml  Net -100.4 ml   Filed Weights     07/09/15 1424 07/10/15 0642  Weight: 77.7 kg (171 lb 4.8 oz) 78 kg (171 lb 15.3 oz)    Physical Exam    General: Pleasant, NAD. Neuro: Alert and oriented X 3. Moves all extremities spontaneously. Psych: Normal affect. HEENT:  Normal  Neck: Supple without bruits or JVD. Lungs:  Resp regular and unlabored, CTA. Heart: RRR no s3, s4, or murmurs. Abdomen: Soft, non-tender, non-distended, BS + x 4.  Extremities: No clubbing, cyanosis or edema. DP/PT/Radials 2+ and equal bilaterally.  Labs    CBC  Recent Labs  07/08/15 2309 07/09/15 1550  WBC 7.5 9.4  HGB 13.9 14.1  HCT 41.2 41.2  MCV 92.0 92.6  PLT 242 007   Basic Metabolic Panel  Recent Labs  07/08/15 2309 07/09/15 1550  NA 130*  --   K 4.1  --   CL 94*  --   CO2 24  --   GLUCOSE 287*  --   BUN 20  --   CREATININE 1.35* 1.12  CALCIUM 9.5  --    Liver Function Tests No results for input(s): AST, ALT, ALKPHOS, BILITOT, PROT, ALBUMIN in the last 72 hours. No results for input(s): LIPASE, AMYLASE  in the last 72 hours. Cardiac Enzymes  Recent Labs  07/09/15 1550 07/09/15 2108 07/10/15 0247  TROPONINI <0.03 <0.03 <0.03   BNP Invalid input(s): POCBNP D-Dimer  Recent Labs  07/09/15 0756  DDIMER 0.59*   Hemoglobin A1C  Recent Labs  07/09/15 1550  HGBA1C 7.0*   Fasting Lipid Panel No results for input(s): CHOL, HDL, LDLCALC, TRIG, CHOLHDL, LDLDIRECT in the last 72 hours. Thyroid Function Tests No results for input(s): TSH, T4TOTAL, T3FREE, THYROIDAB in the last 72 hours.  Invalid input(s): FREET3  Telemetry    NSR  ECG    No new EKG  Radiology /Cardiology    Conclusion    1. Prox LAD lesion, 25% stenosed. 2. Prox RCA to Mid RCA lesion, 10% stenosed.   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  No significant obstructive disease is noted.  Normal left ventricular function. Normal left ventricular end-diastolic  pressure.  Recommendations:   No evidence of obstructive coronary disease.  Further evaluation depends upon the primary cardiology and care team     Assessment & Plan    Principal Problem:   Chest pain with low risk for cardiac etiology Active Problems:   PAF (paroxysmal atrial fibrillation) (HCC)   Type 2 diabetes mellitus with hyperglycemia (HCC)   History of pericarditis   Hypothyroidism   Essential hypertension   ARF (acute renal failure) (HCC)   No evidence of CAD on Cath, unlikely to be Cardiac Chest Pain, but will await results of the Echo to confirm no potential Pericarditis.  BP & HR are stable on current meds.  Pending Echo read, would be stable for d/c from Cardiac perspective --> if not performed early today, can arrange for OP echo & f/u with APP to assess cath site & discuss Echo. If Echo is normal - probably does not require routine Cardiology f/u.  Signed, Leonie Man, M.D., M.S. Interventional Cardiologist   Pager # [phone] Phone # [phone] [address] [place], [place] [address]

## 2015-07-10 NOTE — Care Management Obs Status (Signed)
[facility] NOTIFICATION   Patient Details  Name: Zachary Bean MRN: [id number] Date of Birth: 05/24/1941   Medicare Observation Status Notification Given:  Yes    Zenon Mayo, RN 07/10/2015, 12:04 PM

## 2015-07-10 NOTE — Care Management Note (Signed)
Case Management Note  Patient Details  Name: Zachary Bean MRN: [id number] Date of Birth: 1940/10/25  Subjective/Objective:     Patient is from home with wife, pta indep, no needs.               Action/Plan:   Expected Discharge Date:                  Expected Discharge Plan:  Home/Self Care  In-House Referral:     Discharge planning Services  CM Consult  Post Acute Care Choice:    Choice offered to:     DME Arranged:    DME Agency:     HH Arranged:    [place] Agency:     Status of Service:  Completed, signed off  Medicare Important Message Given:    Date Medicare IM Given:    Medicare IM give by:    Date Additional Medicare IM Given:    Additional Medicare Important Message give by:     If discussed at [place] of Stay Meetings, dates discussed:    Additional Comments:  Zenon Mayo, RN 07/10/2015, 12:05 PM

## 2015-07-10 NOTE — Discharge Summary (Signed)
Physician Discharge Summary  [name] ACZ:[id number] DOB: 1940-08-22 DOA: 07/09/2015  PCP: Lilian Coma, MD  Admit date: 07/09/2015 Discharge date: 07/10/2015  Time spent: 40 minutes  Recommendations for Outpatient Follow-up:  1. Follow-up with primary care physician within one week.   Discharge Diagnoses:  Principal Problem:   Chest pain with low risk for cardiac etiology Active Problems:   PAF (paroxysmal atrial fibrillation) (HCC)   Type 2 diabetes mellitus with hyperglycemia ([place])   History of pericarditis   Hypothyroidism   Essential hypertension   ARF (acute renal failure) ([place])   Discharge Condition:  Stable  Diet recommendation:  Heart healthy  Filed Weights   07/09/15 1424 07/10/15 0642  Weight: 77.7 kg (171 lb 4.8 oz) 78 kg (171 lb 15.3 oz)    History of present illness:  Zachary Bean is a 75 y.o. male with history of hypertension, diabetes mellitus type 2, previous history of pericarditis requiring pericardial window in 2008, previous history of paroxysmal atrial fibrillation status post ablation at [facility] presents to the ER because of chest pain. Patient started developing chest pain last night around 9 PM. Pain was mostly in the left anterior chest wall relating to the left arm. Chest pain is more like pressure lying with no associated shortness of breath had some nausea denies any abdominal pain. Chest pain improved and resolved after sublingual nitroglycerin. Presently chest pain-free. Patient has had a stress test in 2013 which was unremarkable. Patient has been admitted for further workup. Patient has hyperglycemia and has not been taking his diabetic medication for last 2 years.    Hospital Course:   Chest pain Concerning for angina. 2 sets of cardiac enzymes and 12-lead EKG did not show any evidence of ischemia 2-D echo done, report pending. Patient has had previous history of pericarditis requiring pericardial window.  Cardiology consulted [facility]  catheterization was done and showed no evidence of obstructive CAD. D-dimer is slightly positive, very unlikely patiento have PE, does not have tachycardia, no hypoxia or shortness of breath. I think part of his pain is secondary to radicular musculoskeletal pain, patient has positive Spurling's sign.   Hypertension  Continue metoprolol.  Diabetes mellitus type 2, controlled  Patient has not been taking his medications for last 2 years. This is controlled with diet only,  hemoglobin A1c is 7.0.  Hypothyroidism  On Synthroid.  History of lung cancer in remission.  History of paroxysmal atrial fibrillation status post ablation, he is on aspirin.   Procedures:  Cardiac cath done by Dr. Ellyn Hack on 07/09/2015 Conclusion    1. Prox LAD lesion, 25% stenosed. 2. Prox RCA to Mid RCA lesion, 10% stenosed.   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  No significant obstructive disease is noted.  Normal left ventricular function. Normal left ventricular end-diastolic pressure.  Recommendations:   No evidence of obstructive coronary disease.  Further evaluation depends upon the primary cardiology and care team     Consultations:  Cardiology  Discharge Exam: Filed Vitals:   07/10/15 0642 07/10/15 0744  BP: 106/55 107/57  Pulse: 79 83  Temp: 97.6 F (36.4 C) 98 F (36.7 C)  Resp: 19 24   General: Alert and awake, oriented x3, not in any acute distress. HEENT: anicteric sclera, pupils reactive to light and accommodation, EOMI CVS: S1-S2 clear, no murmur rubs or gallops Chest: clear to auscultation bilaterally, no wheezing, rales or rhonchi Abdomen: soft nontender, nondistended, normal bowel  sounds, no organomegaly Extremities: no cyanosis, clubbing or edema noted bilaterally Neuro: Cranial nerves II-XII intact, no focal neurological deficits   Discharge Instructions   Discharge Instructions    Diet -  low sodium heart healthy    Complete by:  As directed      Increase activity slowly    Complete by:  As directed           Current Discharge Medication List    CONTINUE these medications which have NOT CHANGED   Details  aspirin 81 MG tablet Take 81 mg by mouth at bedtime.    Associated Diagnoses: Malignant neoplasm of bronchus and lung, unspecified site    levothyroxine (SYNTHROID, LEVOTHROID) 125 MCG tablet Take 125 mcg by mouth daily.   Associated Diagnoses: Malignant neoplasm of left lung, unspecified part of lung (HCC)    losartan (COZAAR) 50 MG tablet Take 25 mg by mouth daily.    Associated Diagnoses: Malignant neoplasm of bronchus and lung, unspecified site    metoprolol (LOPRESSOR) 50 MG tablet Take 50 mg by mouth daily.   Associated Diagnoses: Malignant neoplasm of bronchus and lung, unspecified site    Multiple Vitamin (MULTIVITAMIN) tablet Take 1 tablet by mouth daily.   Associated Diagnoses: Malignant neoplasm of left lung, unspecified part of lung (HCC)    nitroGLYCERIN (NITROSTAT) 0.4 MG SL tablet Place 1 tablet (0.4 mg total) under the tongue every 5 (five) minutes as needed for chest pain. Qty: 25 tablet, Refills: 2    ranitidine (ZANTAC) 150 MG tablet Take 150 mg by mouth daily.    Associated Diagnoses: Malignant neoplasm of bronchus and lung, unspecified site    ondansetron (ZOFRAN ODT) 8 MG disintegrating tablet Take 1 tablet (8 mg total) by mouth every 8 (eight) hours as needed for nausea or vomiting. Qty: 20 tablet, Refills: 0    pantoprazole (PROTONIX) 20 MG tablet Take 1 tablet (20 mg total) by mouth daily. Qty: 14 tablet, Refills: 0       Allergies  Allergen Reactions  . Contrast Media [Iodinated Diagnostic Agents] Hives and Itching   Follow-up Information    Follow up with Lilian Coma, MD In 1 week.   Specialty:  Family Medicine   Contact information:   [place] [address] [phone]        The  results of significant diagnostics from this hospitalization (including imaging, microbiology, ancillary and laboratory) are listed below for reference.    Significant Diagnostic Studies: Dg Chest 2 View  07/08/2015  CLINICAL DATA:  75 year old male with left-sided chest pain EXAM: CHEST  2 VIEW COMPARISON:  Radiograph dated 06/18/2015 FINDINGS: Stable postsurgical changes of left pneumonectomy with complete opacification of the left hemithorax similar to prior study. There is mild shift of the mediastinum to the left. The right lung is clear. No pneumothorax. The osseous structures are grossly unremarkable. IMPRESSION: Stable postsurgical changes of left pneumonectomy with complete opacification of the left thorax. Electronically Signed   By: Anner Crete M.D.   On: 07/08/2015 23:34   Dg Chest 2 View  06/18/2015  CLINICAL DATA:  History of lung cancer. Chronic shortness of breath and coughing. EXAM: CHEST  2 VIEW COMPARISON:  10/04/2014 FINDINGS: Examination demonstrates evidence of patient's left pneumonectomy with opacification of the left hemithorax and cardiomediastinal shift to the left as these findings are unchanged. Surgical clips are present over the left hilar region. Right lung is adequately inflated and otherwise clear. There are mild degenerate changes  of the spine. There is calcified plaque over the aortic arch. IMPRESSION: No acute findings. Stable complete opacification of the left hemithorax compatible previous left pneumonectomy for lung cancer. Electronically Signed   By: Marin Olp M.D.   On: 06/18/2015 09:10    Microbiology: No results found for this or any previous visit (from the past 240 hour(s)).   Labs: Basic Metabolic Panel:  Recent Labs Lab 07/08/15 2309 07/09/15 1550 07/10/15 0925  NA 130*  --  133*  K 4.1  --  4.5  CL 94*  --  101  CO2 24  --  26  GLUCOSE 287*  --  195*  BUN 20  --  20  CREATININE 1.35* 1.12 1.25*  CALCIUM 9.5  --  9.1   Liver  Function Tests: No results for input(s): AST, ALT, ALKPHOS, BILITOT, PROT, ALBUMIN in the last 168 hours. No results for input(s): LIPASE, AMYLASE in the last 168 hours. No results for input(s): AMMONIA in the last 168 hours. CBC:  Recent Labs Lab 07/08/15 2309 07/09/15 1550  WBC 7.5 9.4  HGB 13.9 14.1  HCT 41.2 41.2  MCV 92.0 92.6  PLT 242 203   Cardiac Enzymes:  Recent Labs Lab 07/09/15 1550 07/09/15 2108 07/10/15 0247  TROPONINI <0.03 <0.03 <0.03   BNP: BNP (last 3 results) No results for input(s): BNP in the last 8760 hours.  ProBNP (last 3 results) No results for input(s): PROBNP in the last 8760 hours.  CBG:  Recent Labs Lab 07/09/15 1421 07/09/15 1711 07/09/15 2235 07/10/15 0638  GLUCAP 117* 167* 220* 158*       Signed:  Verlee Monte A MD.  [facility] 07/10/2015, 12:26 PM

## 2015-07-10 NOTE — Progress Notes (Signed)
Echocardiogram 2D Echocardiogram has been performed.  Tresa Res 07/10/2015, 11:22 AM

## 2015-07-16 DIAGNOSIS — R079 Chest pain, unspecified: Secondary | ICD-10-CM | POA: Diagnosis not present

## 2015-07-16 DIAGNOSIS — S61309A Unspecified open wound of unspecified finger with damage to nail, initial encounter: Secondary | ICD-10-CM | POA: Diagnosis not present

## 2015-07-16 DIAGNOSIS — I25119 Atherosclerotic heart disease of native coronary artery with unspecified angina pectoris: Secondary | ICD-10-CM | POA: Diagnosis not present

## 2015-07-16 DIAGNOSIS — E11319 Type 2 diabetes mellitus with unspecified diabetic retinopathy without macular edema: Secondary | ICD-10-CM | POA: Diagnosis not present

## 2015-07-25 DIAGNOSIS — L03032 Cellulitis of left toe: Secondary | ICD-10-CM | POA: Diagnosis not present

## 2015-07-25 DIAGNOSIS — M79675 Pain in left toe(s): Secondary | ICD-10-CM | POA: Diagnosis not present

## 2015-08-08 DIAGNOSIS — L97529 Non-pressure chronic ulcer of other part of left foot with unspecified severity: Secondary | ICD-10-CM | POA: Diagnosis not present

## 2015-08-16 DIAGNOSIS — I272 Other secondary pulmonary hypertension: Secondary | ICD-10-CM | POA: Diagnosis not present

## 2015-08-16 DIAGNOSIS — Z0000 Encounter for general adult medical examination without abnormal findings: Secondary | ICD-10-CM | POA: Diagnosis not present

## 2015-08-16 DIAGNOSIS — E039 Hypothyroidism, unspecified: Secondary | ICD-10-CM | POA: Diagnosis not present

## 2015-08-16 DIAGNOSIS — E1121 Type 2 diabetes mellitus with diabetic nephropathy: Secondary | ICD-10-CM | POA: Diagnosis not present

## 2015-08-16 DIAGNOSIS — I480 Paroxysmal atrial fibrillation: Secondary | ICD-10-CM | POA: Diagnosis not present

## 2015-08-16 DIAGNOSIS — N182 Chronic kidney disease, stage 2 (mild): Secondary | ICD-10-CM | POA: Diagnosis not present

## 2015-08-16 DIAGNOSIS — K4090 Unilateral inguinal hernia, without obstruction or gangrene, not specified as recurrent: Secondary | ICD-10-CM | POA: Diagnosis not present

## 2015-08-16 DIAGNOSIS — I10 Essential (primary) hypertension: Secondary | ICD-10-CM | POA: Diagnosis not present

## 2015-08-16 DIAGNOSIS — G609 Hereditary and idiopathic neuropathy, unspecified: Secondary | ICD-10-CM | POA: Diagnosis not present

## 2015-08-16 DIAGNOSIS — E559 Vitamin D deficiency, unspecified: Secondary | ICD-10-CM | POA: Diagnosis not present

## 2015-08-16 DIAGNOSIS — Z79899 Other long term (current) drug therapy: Secondary | ICD-10-CM | POA: Diagnosis not present

## 2015-08-16 DIAGNOSIS — Z85118 Personal history of other malignant neoplasm of bronchus and lung: Secondary | ICD-10-CM | POA: Diagnosis not present

## 2015-08-16 DIAGNOSIS — E785 Hyperlipidemia, unspecified: Secondary | ICD-10-CM | POA: Diagnosis not present

## 2015-10-23 DIAGNOSIS — R05 Cough: Secondary | ICD-10-CM | POA: Diagnosis not present

## 2015-10-23 DIAGNOSIS — K5900 Constipation, unspecified: Secondary | ICD-10-CM | POA: Diagnosis not present

## 2015-11-14 ENCOUNTER — Other Ambulatory Visit (HOSPITAL_COMMUNITY): Payer: Self-pay | Admitting: Family Medicine

## 2015-11-14 ENCOUNTER — Other Ambulatory Visit: Payer: Self-pay | Admitting: Family Medicine

## 2015-11-14 DIAGNOSIS — J441 Chronic obstructive pulmonary disease with (acute) exacerbation: Secondary | ICD-10-CM | POA: Diagnosis not present

## 2015-11-14 DIAGNOSIS — R05 Cough: Secondary | ICD-10-CM | POA: Diagnosis not present

## 2015-11-14 DIAGNOSIS — Z85118 Personal history of other malignant neoplasm of bronchus and lung: Secondary | ICD-10-CM

## 2015-11-14 DIAGNOSIS — R791 Abnormal coagulation profile: Secondary | ICD-10-CM

## 2015-11-14 DIAGNOSIS — R059 Cough, unspecified: Secondary | ICD-10-CM

## 2015-11-15 ENCOUNTER — Ambulatory Visit (HOSPITAL_COMMUNITY)
Admission: RE | Admit: 2015-11-15 | Discharge: 2015-11-15 | Disposition: A | Discharge status: Home / Self Care | Payer: PPO | Source: Ambulatory Visit | Attending: Family Medicine | Admitting: Family Medicine

## 2015-11-15 ENCOUNTER — Encounter (HOSPITAL_COMMUNITY): Payer: Self-pay

## 2015-11-15 DIAGNOSIS — R05 Cough: Secondary | ICD-10-CM | POA: Insufficient documentation

## 2015-11-15 DIAGNOSIS — R791 Abnormal coagulation profile: Secondary | ICD-10-CM | POA: Diagnosis not present

## 2015-11-15 DIAGNOSIS — R918 Other nonspecific abnormal finding of lung field: Secondary | ICD-10-CM | POA: Diagnosis not present

## 2015-11-15 DIAGNOSIS — R059 Cough, unspecified: Secondary | ICD-10-CM

## 2015-11-15 DIAGNOSIS — Z85118 Personal history of other malignant neoplasm of bronchus and lung: Secondary | ICD-10-CM | POA: Diagnosis not present

## 2015-11-15 LAB — POCT I-STAT CREATININE: CREATININE: 1.2 mg/dL (ref 0.61–1.24)

## 2015-11-15 MED ORDER — IOPAMIDOL (ISOVUE-370) INJECTION 76%
100.0000 mL | Freq: Once | INTRAVENOUS | Status: AC | PRN
  Administered 2015-11-15: 100 mL via INTRAVENOUS

## 2015-11-21 DIAGNOSIS — J189 Pneumonia, unspecified organism: Secondary | ICD-10-CM | POA: Diagnosis not present

## 2015-12-31 ENCOUNTER — Ambulatory Visit
Admission: RE | Admit: 2015-12-31 | Discharge: 2015-12-31 | Disposition: A | Discharge status: Home / Self Care | Payer: PPO | Source: Ambulatory Visit | Attending: Family Medicine | Admitting: Family Medicine

## 2015-12-31 ENCOUNTER — Other Ambulatory Visit: Payer: Self-pay | Admitting: Family Medicine

## 2015-12-31 DIAGNOSIS — R918 Other nonspecific abnormal finding of lung field: Secondary | ICD-10-CM | POA: Diagnosis not present

## 2015-12-31 DIAGNOSIS — J189 Pneumonia, unspecified organism: Secondary | ICD-10-CM

## 2016-03-02 DIAGNOSIS — J029 Acute pharyngitis, unspecified: Secondary | ICD-10-CM | POA: Diagnosis not present

## 2016-03-02 DIAGNOSIS — J111 Influenza due to unidentified influenza virus with other respiratory manifestations: Secondary | ICD-10-CM | POA: Diagnosis not present

## 2016-03-23 DIAGNOSIS — K219 Gastro-esophageal reflux disease without esophagitis: Secondary | ICD-10-CM | POA: Diagnosis not present

## 2016-03-23 DIAGNOSIS — Z86010 Personal history of colonic polyps: Secondary | ICD-10-CM | POA: Diagnosis not present

## 2016-04-15 DIAGNOSIS — K2970 Gastritis, unspecified, without bleeding: Secondary | ICD-10-CM | POA: Diagnosis not present

## 2016-04-15 DIAGNOSIS — K5730 Diverticulosis of large intestine without perforation or abscess without bleeding: Secondary | ICD-10-CM | POA: Diagnosis not present

## 2016-04-15 DIAGNOSIS — R1013 Epigastric pain: Secondary | ICD-10-CM | POA: Diagnosis not present

## 2016-04-15 DIAGNOSIS — K635 Polyp of colon: Secondary | ICD-10-CM | POA: Diagnosis not present

## 2016-04-15 DIAGNOSIS — Z86010 Personal history of colonic polyps: Secondary | ICD-10-CM | POA: Diagnosis not present

## 2016-04-15 DIAGNOSIS — D120 Benign neoplasm of cecum: Secondary | ICD-10-CM | POA: Diagnosis not present

## 2016-04-15 DIAGNOSIS — K2930 Chronic superficial gastritis without bleeding: Secondary | ICD-10-CM | POA: Diagnosis not present

## 2016-04-21 DIAGNOSIS — K2930 Chronic superficial gastritis without bleeding: Secondary | ICD-10-CM | POA: Diagnosis not present

## 2016-04-21 DIAGNOSIS — K635 Polyp of colon: Secondary | ICD-10-CM | POA: Diagnosis not present

## 2016-05-26 DIAGNOSIS — J209 Acute bronchitis, unspecified: Secondary | ICD-10-CM | POA: Diagnosis not present

## 2016-06-01 DIAGNOSIS — J45901 Unspecified asthma with (acute) exacerbation: Secondary | ICD-10-CM | POA: Diagnosis not present

## 2016-06-01 DIAGNOSIS — J189 Pneumonia, unspecified organism: Secondary | ICD-10-CM | POA: Diagnosis not present

## 2016-06-19 ENCOUNTER — Telehealth: Payer: Self-pay | Admitting: Oncology

## 2016-06-19 ENCOUNTER — Other Ambulatory Visit: Payer: Self-pay | Admitting: *Deleted

## 2016-06-19 ENCOUNTER — Ambulatory Visit (HOSPITAL_BASED_OUTPATIENT_CLINIC_OR_DEPARTMENT_OTHER): Payer: PPO | Admitting: Oncology

## 2016-06-19 ENCOUNTER — Ambulatory Visit (HOSPITAL_COMMUNITY)
Admission: RE | Admit: 2016-06-19 | Discharge: 2016-06-19 | Disposition: A | Discharge status: Home / Self Care | Payer: Worker's Compensation | Source: Ambulatory Visit | Attending: Oncology | Admitting: Oncology

## 2016-06-19 VITALS — BP 125/66 | HR 71 | Temp 97.7°F | Resp 18 | Ht 70.0 in | Wt 178.6 lb

## 2016-06-19 DIAGNOSIS — Z902 Acquired absence of lung [part of]: Secondary | ICD-10-CM | POA: Insufficient documentation

## 2016-06-19 DIAGNOSIS — Z85118 Personal history of other malignant neoplasm of bronchus and lung: Secondary | ICD-10-CM

## 2016-06-19 DIAGNOSIS — C3492 Malignant neoplasm of unspecified part of left bronchus or lung: Secondary | ICD-10-CM

## 2016-06-19 DIAGNOSIS — C3490 Malignant neoplasm of unspecified part of unspecified bronchus or lung: Secondary | ICD-10-CM | POA: Diagnosis not present

## 2016-06-19 NOTE — Telephone Encounter (Signed)
Appointments scheduled per 06/19/16 los. Patient was given a copy of the AVS report and appointment schedule per 06/19/16 los.

## 2016-06-19 NOTE — Progress Notes (Signed)
  [place] OFFICE PROGRESS NOTE   Diagnosis: Non-small cell lung cancer  INTERVAL HISTORY:   Mr. Fatheree returns as scheduled. He reports being diagnosed with "pneumonia "and the "flu "last year. He feels well. Good appetite. No complaint today.  Objective:  Vital signs in last 24 hours:  Blood pressure 125/66, pulse 71, temperature 97.7 F (36.5 C), temperature source Oral, resp. rate 18, height '5\' 10"'$  (1.778 m), weight 178 lb 9.6 oz (81 kg), SpO2 100 %.    HEENT: Neck without mass Lymphatics: No cervical, supraclavicular, or axillary nodes Resp: Decreased breath sounds throughout the left chest, distant breath sounds at the right chest, no respiratory distress Cardio: Regular rate and rhythm GI: No hepatomegaly, nontender Vascular: No leg edema  Skin: Left thoracotomy scar without evidence of recurrent tumor       Imaging:  Dg Chest 2 View  Result Date: 06/19/2016 CLINICAL DATA:  Lung cancer EXAM: CHEST  2 VIEW COMPARISON:  12/31/2015 FINDINGS: Status post left pneumonectomy. Right lung remains hyperaerated. The mediastinum has shifted into the left hemithorax. There is no pneumothorax. Right lung is clear. IMPRESSION: Status post left pneumonectomy.  No active cardiopulmonary disease. Electronically Signed   By: Marybelle Killings M.D.   On: 06/19/2016 12:09    Medications: I have reviewed the patient's current medications.  Assessment/Plan: 1. Stage III non-small cell lung cancer diagnosed in September 1995. He remains in clinical remission. 2. History of a cough following an ACE inhibitor. 3. Admission to [facility] in February 2008 with atrial fibrillation and a pericardial effusion.   Disposition:  Mr. Shallenberger appears unchanged. No evidence for recurrence of lung cancer. He would like to continue follow-up at the [facility]. He will return for an office visit and chest x-ray in one year.  15 minutes were spent with the patient today. The  majority of the time was used for counseling and coordination of care.  Betsy Coder, MD  06/19/2016  12:49 PM

## 2016-10-21 DIAGNOSIS — I25119 Atherosclerotic heart disease of native coronary artery with unspecified angina pectoris: Secondary | ICD-10-CM | POA: Diagnosis not present

## 2016-10-21 DIAGNOSIS — E1340 Other specified diabetes mellitus with diabetic neuropathy, unspecified: Secondary | ICD-10-CM | POA: Diagnosis not present

## 2016-10-21 DIAGNOSIS — E1121 Type 2 diabetes mellitus with diabetic nephropathy: Secondary | ICD-10-CM | POA: Diagnosis not present

## 2016-10-21 DIAGNOSIS — I2721 Secondary pulmonary arterial hypertension: Secondary | ICD-10-CM | POA: Diagnosis not present

## 2016-10-21 DIAGNOSIS — Z79899 Other long term (current) drug therapy: Secondary | ICD-10-CM | POA: Diagnosis not present

## 2016-10-21 DIAGNOSIS — E039 Hypothyroidism, unspecified: Secondary | ICD-10-CM | POA: Diagnosis not present

## 2016-10-21 DIAGNOSIS — N182 Chronic kidney disease, stage 2 (mild): Secondary | ICD-10-CM | POA: Diagnosis not present

## 2016-10-21 DIAGNOSIS — I480 Paroxysmal atrial fibrillation: Secondary | ICD-10-CM | POA: Diagnosis not present

## 2016-10-21 DIAGNOSIS — Z23 Encounter for immunization: Secondary | ICD-10-CM | POA: Diagnosis not present

## 2016-10-21 DIAGNOSIS — E785 Hyperlipidemia, unspecified: Secondary | ICD-10-CM | POA: Diagnosis not present

## 2016-10-21 DIAGNOSIS — I272 Pulmonary hypertension, unspecified: Secondary | ICD-10-CM | POA: Diagnosis not present

## 2016-10-21 DIAGNOSIS — E559 Vitamin D deficiency, unspecified: Secondary | ICD-10-CM | POA: Diagnosis not present

## 2016-10-21 DIAGNOSIS — Z0000 Encounter for general adult medical examination without abnormal findings: Secondary | ICD-10-CM | POA: Diagnosis not present

## 2016-12-15 DIAGNOSIS — E119 Type 2 diabetes mellitus without complications: Secondary | ICD-10-CM | POA: Diagnosis not present

## 2016-12-15 DIAGNOSIS — Z961 Presence of intraocular lens: Secondary | ICD-10-CM | POA: Diagnosis not present

## 2016-12-15 DIAGNOSIS — H43813 Vitreous degeneration, bilateral: Secondary | ICD-10-CM | POA: Diagnosis not present

## 2016-12-15 DIAGNOSIS — Z7984 Long term (current) use of oral hypoglycemic drugs: Secondary | ICD-10-CM | POA: Diagnosis not present

## 2017-03-04 DIAGNOSIS — L820 Inflamed seborrheic keratosis: Secondary | ICD-10-CM | POA: Diagnosis not present

## 2017-03-04 DIAGNOSIS — L570 Actinic keratosis: Secondary | ICD-10-CM | POA: Diagnosis not present

## 2017-03-04 DIAGNOSIS — C44519 Basal cell carcinoma of skin of other part of trunk: Secondary | ICD-10-CM | POA: Diagnosis not present

## 2017-03-04 DIAGNOSIS — D229 Melanocytic nevi, unspecified: Secondary | ICD-10-CM | POA: Diagnosis not present

## 2017-03-04 DIAGNOSIS — L821 Other seborrheic keratosis: Secondary | ICD-10-CM | POA: Diagnosis not present

## 2017-03-04 DIAGNOSIS — C44222 Squamous cell carcinoma of skin of right ear and external auricular canal: Secondary | ICD-10-CM | POA: Diagnosis not present

## 2017-03-04 DIAGNOSIS — D485 Neoplasm of uncertain behavior of skin: Secondary | ICD-10-CM | POA: Diagnosis not present

## 2017-03-04 DIAGNOSIS — L814 Other melanin hyperpigmentation: Secondary | ICD-10-CM | POA: Diagnosis not present

## 2017-04-28 DIAGNOSIS — C44222 Squamous cell carcinoma of skin of right ear and external auricular canal: Secondary | ICD-10-CM | POA: Diagnosis not present

## 2017-06-04 DIAGNOSIS — L820 Inflamed seborrheic keratosis: Secondary | ICD-10-CM | POA: Diagnosis not present

## 2017-06-04 DIAGNOSIS — L821 Other seborrheic keratosis: Secondary | ICD-10-CM | POA: Diagnosis not present

## 2017-06-04 DIAGNOSIS — Z85828 Personal history of other malignant neoplasm of skin: Secondary | ICD-10-CM | POA: Diagnosis not present

## 2017-06-04 DIAGNOSIS — C44519 Basal cell carcinoma of skin of other part of trunk: Secondary | ICD-10-CM | POA: Diagnosis not present

## 2017-06-04 DIAGNOSIS — L570 Actinic keratosis: Secondary | ICD-10-CM | POA: Diagnosis not present

## 2017-06-04 DIAGNOSIS — D225 Melanocytic nevi of trunk: Secondary | ICD-10-CM | POA: Diagnosis not present

## 2017-06-17 ENCOUNTER — Telehealth: Payer: Self-pay | Admitting: Oncology

## 2017-06-17 ENCOUNTER — Ambulatory Visit (HOSPITAL_COMMUNITY)
Admission: RE | Admit: 2017-06-17 | Discharge: 2017-06-17 | Disposition: A | Discharge status: Home / Self Care | Payer: Worker's Compensation | Source: Ambulatory Visit | Attending: Oncology | Admitting: Oncology

## 2017-06-17 ENCOUNTER — Inpatient Hospital Stay: Payer: Worker's Compensation | Attending: Oncology | Admitting: Oncology

## 2017-06-17 VITALS — BP 121/66 | HR 87 | Temp 97.7°F | Resp 19 | Ht 70.0 in | Wt 181.7 lb

## 2017-06-17 DIAGNOSIS — Z9889 Other specified postprocedural states: Secondary | ICD-10-CM | POA: Diagnosis not present

## 2017-06-17 DIAGNOSIS — J939 Pneumothorax, unspecified: Secondary | ICD-10-CM | POA: Diagnosis not present

## 2017-06-17 DIAGNOSIS — C3492 Malignant neoplasm of unspecified part of left bronchus or lung: Secondary | ICD-10-CM | POA: Diagnosis not present

## 2017-06-17 DIAGNOSIS — J948 Other specified pleural conditions: Secondary | ICD-10-CM | POA: Insufficient documentation

## 2017-06-17 DIAGNOSIS — Z85118 Personal history of other malignant neoplasm of bronchus and lung: Secondary | ICD-10-CM | POA: Insufficient documentation

## 2017-06-17 NOTE — Progress Notes (Signed)
  [place] OFFICE PROGRESS NOTE   Diagnosis: Non-small cell lung cancer  INTERVAL HISTORY:   Mr. Kipp returns as scheduled.  He feels well.  Good appetite.  Stable dyspnea.  He reports having several nonmelanoma skin cancers removed including a lesion at the right ear.  Objective:  Vital signs in last 24 hours:  Blood pressure 121/66, pulse 87, temperature 97.7 F (36.5 C), temperature source Oral, resp. rate 19, height 5\' 10"  (1.778 m), weight 181 lb 11.2 oz (82.4 kg), SpO2 98 %.    HEENT: Neck without mass Lymphatics: No cervical, supraclavicular, or axillary nodes Resp: Decreased breath sounds at the left chest, no respiratory distress Cardio: Regular rate and rhythm GI: No hepatosplenomegaly Vascular: No leg edema  Skin: Left thoracotomy scar without evidence of recurrent tumor  Medications: I have reviewed the patient's current medications.   Assessment/Plan: 1. Stage III non-small cell lung cancer diagnosed in September 1995. He remains in clinical remission. 2. History of a cough following an ACE inhibitor. 3. Admission to [facility] in February 2008 with atrial fibrillation and a pericardial effusion.  Disposition: Mr. Barnhard remains in remission from non-small cell lung cancer.  Preliminary review of a chest x-ray today reveals no acute change.  We will follow-up on the final report.  Mr. Cline would like to continue yearly follow-up at the Cancer center.  He will return for an office visit and chest x-ray in 1 year.  15 minutes were spent with the patient today.  The majority of the time was used for counseling and coordination of care.  Betsy Coder, MD  06/17/2017  1:31 PM

## 2017-06-17 NOTE — Telephone Encounter (Signed)
Scheduled appt per 1/24 los- Gave patient AVS and calender per los.

## 2017-10-04 DIAGNOSIS — Z7984 Long term (current) use of oral hypoglycemic drugs: Secondary | ICD-10-CM | POA: Diagnosis not present

## 2017-10-04 DIAGNOSIS — E1121 Type 2 diabetes mellitus with diabetic nephropathy: Secondary | ICD-10-CM | POA: Diagnosis not present

## 2017-10-04 DIAGNOSIS — E1165 Type 2 diabetes mellitus with hyperglycemia: Secondary | ICD-10-CM | POA: Diagnosis not present

## 2017-10-04 DIAGNOSIS — K4090 Unilateral inguinal hernia, without obstruction or gangrene, not specified as recurrent: Secondary | ICD-10-CM | POA: Diagnosis not present

## 2017-10-27 ENCOUNTER — Other Ambulatory Visit: Payer: Self-pay | Admitting: Surgery

## 2017-10-27 DIAGNOSIS — K4090 Unilateral inguinal hernia, without obstruction or gangrene, not specified as recurrent: Secondary | ICD-10-CM | POA: Diagnosis not present

## 2017-11-04 NOTE — Patient Instructions (Addendum)
Zachary Bean  11/04/2017   Your procedure is scheduled on: 11-11-17  Report to [facility] Main  Entrance  Report to admitting at 700 AM    Call this number if you have problems the morning of surgery [phone]   Remember: Do not eat food  :After Midnight.NO SOLID FOOD AFTER MIDNIGHT THE NIGHT PRIOR TO SURGERY. NOTHING BY MOUTH EXCEPT CLEAR LIQUIDS UNTIL 3 HOURS PRIOR TO [place] SURGERY. PLEASE FINISH ENSURE DRINK PER SURGEON ORDER 3 HOURS PRIOR TO SCHEDULED SURGERY TIME WHICH NEEDS TO BE COMPLETED AT 600 AM.   CLEAR LIQUID DIET   Foods Allowed                                                                     Foods Excluded  Coffee and tea, regular and decaf                             liquids that you cannot  Plain Jell-O in any flavor                                             see through such as: Fruit ices (not with fruit pulp)                                     milk, soups, orange juice  Iced Popsicles                                    All solid food Carbonated beverages, regular and diet                                    Cranberry, grape and apple juices Sports drinks like Gatorade Lightly seasoned clear broth or consume(fat free) Sugar, honey syrup  Sample Menu Breakfast                                Lunch                                     Supper Cranberry juice                    Beef broth                            Chicken broth Jell-O                                     Grape juice  Apple juice Coffee or tea                        Jell-O                                      Popsicle                                                Coffee or tea                        Coffee or tea  _____________________________________________________________________    How to Manage Your Diabetes Before and After Surgery  Why is it important to control my blood sugar before and after surgery? . Improving blood sugar  levels before and after surgery helps healing and can limit problems. . A way of improving blood sugar control is eating a healthy diet by: o  Eating less sugar and carbohydrates o  Increasing activity/exercise o  Talking with your doctor about reaching your blood sugar goals . High blood sugars (greater than 180 mg/dL) can raise your risk of infections and slow your recovery, so you will need to focus on controlling your diabetes during the weeks before surgery. . Make sure that the doctor who takes care of your diabetes knows about your planned surgery including the date and location.  How do I manage my blood sugar before surgery? . Check your blood sugar at least 4 times a day, starting 2 days before surgery, to make sure that the level is not too high or low. o Check your blood sugar the morning of your surgery when you wake up and every 2 hours until you get to the Short Stay unit. . If your blood sugar is less than 70 mg/dL, you will need to treat for low blood sugar: o Do not take insulin. o Treat a low blood sugar (less than 70 mg/dL) with  cup of clear juice (cranberry or apple), 4 glucose tablets, OR glucose gel.  o Recheck blood sugar in 15 minutes after treatment (to make sure it is greater than 70 mg/dL). If your blood sugar is not greater than 70 mg/dL on recheck, call [phone] for further instructions. . Report your blood sugar to the short stay nurse when you get to Short Stay.  . If you are admitted to the hospital after surgery: o Your blood sugar will be checked by the staff and you will probably be given insulin after surgery (instead of oral diabetes medicines) to make sure you have good blood sugar levels. o The goal for blood sugar control after surgery is 80-180 mg/dL.   WHAT DO I DO ABOUT MY DIABETES MEDICATION?  Marland Kitchen Do not take oral diabetes medicines (pills) the morning of surgery.  . THE DAY BEFORE SURGERY  TAKE ONLY YOUR MORNING DOSE OF GLIPIZIDE      THE  MORNING OF SURGERY DO NOT TAKE YOUR GLIPIZIDE  Take these medicines the morning of surgery with A SIP OF WATER: SYMBICORT, METOPROLOL, LEVOTHRYOXINE (SYNTHROID DO NOT TAKE ANY DIABETIC MEDICATIONS DAY OF YOUR SURGERY  You may not have any metal on your body including hair pins and              piercings  Do not wear jewelry, make-up, lotions, powders or perfumes, deodorant             Do not wear nail polish.  Do not shave  48 hours prior to surgery.              Men may shave face and neck.   Do not bring valuables to the hospital. [place].  Contacts, dentures or bridgework may not be worn into surgery.  Leave suitcase in the car. After surgery it may be brought to your room.                  Please read over the following fact sheets you were given: _____________________________________________________________________             [facility] - Preparing for Surgery Before surgery, you can play an important role.  Because skin is not sterile, your skin needs to be as free of germs as possible.  You can reduce the number of germs on your skin by washing with CHG (chlorahexidine gluconate) soap before surgery.  CHG is an antiseptic cleaner which kills germs and bonds with the skin to continue killing germs even after washing. Please DO NOT use if you have an allergy to CHG or antibacterial soaps.  If your skin becomes reddened/irritated stop using the CHG and inform your nurse when you arrive at Short Stay. Do not shave (including legs and underarms) for at least 48 hours prior to the first CHG shower.  You may shave your face/neck. Please follow these instructions carefully:  1.  Shower with CHG Soap the night before surgery and the  morning of Surgery.  2.  If you choose to wash your hair, wash your hair first as usual with your  normal  shampoo.  3.  After you shampoo, rinse your hair and body thoroughly to  remove the  shampoo.                           4.  Use CHG as you would any other liquid soap.  You can apply chg directly  to the skin and wash                       Gently with a scrungie or clean washcloth.  5.  Apply the CHG Soap to your body ONLY FROM THE NECK DOWN.   Do not use on face/ open                           Wound or open sores. Avoid contact with eyes, ears mouth and genitals (private parts).                       Wash face,  Genitals (private parts) with your normal soap.             6.  Wash thoroughly, paying special attention to the area where your surgery  will be performed.  7.  Thoroughly rinse your body with warm water from the neck down.  8.  DO NOT shower/wash with your normal soap after using  and rinsing off  the CHG Soap.                9.  Pat yourself dry with a clean towel.            10.  Wear clean pajamas.            11.  Place clean sheets on your bed the night of your first shower and do not  sleep with pets. Day of Surgery : Do not apply any lotions/deodorants the morning of surgery.  Please wear clean clothes to the hospital/surgery center.  FAILURE TO FOLLOW THESE INSTRUCTIONS MAY RESULT IN THE CANCELLATION OF YOUR SURGERY PATIENT SIGNATURE_________________________________  NURSE SIGNATURE__________________________________  ________________________________________________________________________

## 2017-11-04 NOTE — Progress Notes (Signed)
LOV DR Stephanie Acre 10-21-17 ON CHART HEAMGLOBIN A1C 10-04-17 DR Stephanie Acre ON CHART

## 2017-11-05 ENCOUNTER — Encounter (HOSPITAL_COMMUNITY): Payer: Self-pay

## 2017-11-05 ENCOUNTER — Encounter (HOSPITAL_COMMUNITY)
Admission: RE | Admit: 2017-11-05 | Discharge: 2017-11-05 | Disposition: A | Discharge status: Home / Self Care | Payer: PPO | Source: Ambulatory Visit | Attending: Surgery | Admitting: Surgery

## 2017-11-05 ENCOUNTER — Other Ambulatory Visit: Payer: Self-pay

## 2017-11-05 DIAGNOSIS — K4090 Unilateral inguinal hernia, without obstruction or gangrene, not specified as recurrent: Secondary | ICD-10-CM | POA: Insufficient documentation

## 2017-11-05 DIAGNOSIS — Z01812 Encounter for preprocedural laboratory examination: Secondary | ICD-10-CM | POA: Insufficient documentation

## 2017-11-05 DIAGNOSIS — Z01818 Encounter for other preprocedural examination: Secondary | ICD-10-CM | POA: Insufficient documentation

## 2017-11-05 DIAGNOSIS — I10 Essential (primary) hypertension: Secondary | ICD-10-CM | POA: Diagnosis not present

## 2017-11-05 HISTORY — DX: Personal history of urinary calculi: Z87.442

## 2017-11-05 HISTORY — DX: Chronic kidney disease, unspecified: N18.9

## 2017-11-05 LAB — BASIC METABOLIC PANEL
ANION GAP: 8 (ref 5–15)
BUN: 16 mg/dL (ref 6–20)
CALCIUM: 9.5 mg/dL (ref 8.9–10.3)
CO2: 28 mmol/L (ref 22–32)
CREATININE: 1.38 mg/dL — AB (ref 0.61–1.24)
Chloride: 99 mmol/L — ABNORMAL LOW (ref 101–111)
GFR, EST AFRICAN AMERICAN: 57 mL/min — AB (ref >60)
GFR, EST NON AFRICAN AMERICAN: 49 mL/min — AB (ref >60)
GLUCOSE: 136 mg/dL — AB (ref 65–99)
Potassium: 4.5 mmol/L (ref 3.5–5.1)
Sodium: 135 mmol/L (ref 135–145)

## 2017-11-05 LAB — CBC
HCT: 45.2 % (ref 39.0–52.0)
HEMOGLOBIN: 15.2 g/dL (ref 13.0–17.0)
MCH: 31.8 pg (ref 26.0–34.0)
MCHC: 33.6 g/dL (ref 30.0–36.0)
MCV: 94.6 fL (ref 78.0–100.0)
PLATELETS: 242 10*3/uL (ref 150–400)
RBC: 4.78 MIL/uL (ref 4.22–5.81)
RDW: 12.1 % (ref 11.5–15.5)
WBC: 7.9 10*3/uL (ref 4.0–10.5)

## 2017-11-05 LAB — GLUCOSE, CAPILLARY: GLUCOSE-CAPILLARY: 143 mg/dL — AB (ref 65–99)

## 2017-11-08 ENCOUNTER — Encounter (HOSPITAL_COMMUNITY): Payer: Self-pay

## 2017-11-11 ENCOUNTER — Ambulatory Visit (HOSPITAL_COMMUNITY): Payer: PPO | Admitting: Anesthesiology

## 2017-11-11 ENCOUNTER — Encounter (HOSPITAL_COMMUNITY): Payer: Self-pay | Admitting: *Deleted

## 2017-11-11 ENCOUNTER — Observation Stay (HOSPITAL_COMMUNITY)
Admission: RE | Admit: 2017-11-11 | Discharge: 2017-11-12 | Disposition: A | Discharge status: Home / Self Care | Payer: PPO | Source: Ambulatory Visit | Attending: Surgery | Admitting: Surgery

## 2017-11-11 ENCOUNTER — Other Ambulatory Visit: Payer: Self-pay

## 2017-11-11 ENCOUNTER — Encounter (HOSPITAL_COMMUNITY)
Admission: RE | Disposition: A | Discharge status: Home / Self Care | Payer: Self-pay | Source: Ambulatory Visit | Attending: Surgery

## 2017-11-11 DIAGNOSIS — Z87891 Personal history of nicotine dependence: Secondary | ICD-10-CM | POA: Insufficient documentation

## 2017-11-11 DIAGNOSIS — K4090 Unilateral inguinal hernia, without obstruction or gangrene, not specified as recurrent: Principal | ICD-10-CM | POA: Insufficient documentation

## 2017-11-11 DIAGNOSIS — I10 Essential (primary) hypertension: Secondary | ICD-10-CM | POA: Diagnosis not present

## 2017-11-11 DIAGNOSIS — I480 Paroxysmal atrial fibrillation: Secondary | ICD-10-CM | POA: Insufficient documentation

## 2017-11-11 DIAGNOSIS — G8918 Other acute postprocedural pain: Secondary | ICD-10-CM | POA: Diagnosis not present

## 2017-11-11 DIAGNOSIS — Z79899 Other long term (current) drug therapy: Secondary | ICD-10-CM | POA: Insufficient documentation

## 2017-11-11 DIAGNOSIS — Z888 Allergy status to other drugs, medicaments and biological substances status: Secondary | ICD-10-CM | POA: Insufficient documentation

## 2017-11-11 DIAGNOSIS — Z9889 Other specified postprocedural states: Secondary | ICD-10-CM

## 2017-11-11 DIAGNOSIS — Z7951 Long term (current) use of inhaled steroids: Secondary | ICD-10-CM | POA: Diagnosis not present

## 2017-11-11 DIAGNOSIS — I129 Hypertensive chronic kidney disease with stage 1 through stage 4 chronic kidney disease, or unspecified chronic kidney disease: Secondary | ICD-10-CM | POA: Insufficient documentation

## 2017-11-11 DIAGNOSIS — Z85118 Personal history of other malignant neoplasm of bronchus and lung: Secondary | ICD-10-CM | POA: Insufficient documentation

## 2017-11-11 DIAGNOSIS — Z91041 Radiographic dye allergy status: Secondary | ICD-10-CM | POA: Insufficient documentation

## 2017-11-11 DIAGNOSIS — E039 Hypothyroidism, unspecified: Secondary | ICD-10-CM | POA: Diagnosis not present

## 2017-11-11 DIAGNOSIS — E785 Hyperlipidemia, unspecified: Secondary | ICD-10-CM | POA: Insufficient documentation

## 2017-11-11 DIAGNOSIS — Z7984 Long term (current) use of oral hypoglycemic drugs: Secondary | ICD-10-CM | POA: Diagnosis not present

## 2017-11-11 DIAGNOSIS — E1122 Type 2 diabetes mellitus with diabetic chronic kidney disease: Secondary | ICD-10-CM | POA: Insufficient documentation

## 2017-11-11 DIAGNOSIS — Z8719 Personal history of other diseases of the digestive system: Secondary | ICD-10-CM

## 2017-11-11 DIAGNOSIS — N182 Chronic kidney disease, stage 2 (mild): Secondary | ICD-10-CM | POA: Insufficient documentation

## 2017-11-11 DIAGNOSIS — J45909 Unspecified asthma, uncomplicated: Secondary | ICD-10-CM | POA: Diagnosis not present

## 2017-11-11 HISTORY — PX: INSERTION OF MESH: SHX5868

## 2017-11-11 HISTORY — PX: INGUINAL HERNIA REPAIR: SHX194

## 2017-11-11 LAB — GLUCOSE, CAPILLARY
GLUCOSE-CAPILLARY: 214 mg/dL — AB (ref 65–99)
Glucose-Capillary: 326 mg/dL — ABNORMAL HIGH (ref 65–99)
Glucose-Capillary: 281 mg/dL — ABNORMAL HIGH (ref 65–99)

## 2017-11-11 SURGERY — REPAIR, HERNIA, INGUINAL, ADULT
Anesthesia: Regional | Site: Inguinal | Laterality: Right

## 2017-11-11 MED ORDER — LIDOCAINE 2% (20 MG/ML) 5 ML SYRINGE
INTRAMUSCULAR | Status: AC
  Filled 2017-11-11: qty 5

## 2017-11-11 MED ORDER — CHLORHEXIDINE GLUCONATE CLOTH 2 % EX PADS: 6.0000 | MEDICATED_PAD | Freq: Once | CUTANEOUS | Status: DC

## 2017-11-11 MED ORDER — GABAPENTIN 300 MG PO CAPS
300.0000 mg | ORAL_CAPSULE | ORAL | Status: AC
  Administered 2017-11-11: 300 mg via ORAL
  Filled 2017-11-11: qty 1

## 2017-11-11 MED ORDER — FENTANYL CITRATE (PF) 100 MCG/2ML IJ SOLN
INTRAMUSCULAR | Status: DC | PRN
  Administered 2017-11-11: 50 ug via INTRAVENOUS

## 2017-11-11 MED ORDER — FENTANYL CITRATE (PF) 100 MCG/2ML IJ SOLN
INTRAMUSCULAR | Status: AC
  Filled 2017-11-11: qty 2

## 2017-11-11 MED ORDER — ONDANSETRON HCL 4 MG/2ML IJ SOLN: 4.0000 mg | Freq: Four times a day (QID) | INTRAMUSCULAR | Status: DC | PRN

## 2017-11-11 MED ORDER — ONDANSETRON 4 MG PO TBDP: 4.0000 mg | ORAL_TABLET | Freq: Four times a day (QID) | ORAL | Status: DC | PRN

## 2017-11-11 MED ORDER — SUCCINYLCHOLINE CHLORIDE 200 MG/10ML IV SOSY
PREFILLED_SYRINGE | INTRAVENOUS | Status: AC
  Filled 2017-11-11: qty 10

## 2017-11-11 MED ORDER — OXYCODONE HCL 5 MG/5ML PO SOLN
5.0000 mg | Freq: Once | ORAL | Status: DC | PRN
  Filled 2017-11-11: qty 5

## 2017-11-11 MED ORDER — BUPIVACAINE HCL (PF) 0.5 % IJ SOLN
INTRAMUSCULAR | Status: AC
  Filled 2017-11-11: qty 30

## 2017-11-11 MED ORDER — BUPIVACAINE HCL (PF) 0.5 % IJ SOLN
INTRAMUSCULAR | Status: DC | PRN
  Administered 2017-11-11: 10 mL

## 2017-11-11 MED ORDER — ONDANSETRON HCL 4 MG/2ML IJ SOLN
INTRAMUSCULAR | Status: DC | PRN
  Administered 2017-11-11: 4 mg via INTRAVENOUS

## 2017-11-11 MED ORDER — TRAMADOL HCL 50 MG PO TABS: 50.0000 mg | ORAL_TABLET | Freq: Four times a day (QID) | ORAL | Status: DC | PRN

## 2017-11-11 MED ORDER — LEVOTHYROXINE SODIUM 25 MCG PO TABS
125.0000 ug | ORAL_TABLET | Freq: Every day | ORAL | Status: DC
  Administered 2017-11-12: 125 ug via ORAL
  Filled 2017-11-11: qty 1

## 2017-11-11 MED ORDER — GLYCOPYRROLATE PF 0.2 MG/ML IJ SOSY
PREFILLED_SYRINGE | INTRAMUSCULAR | Status: DC | PRN
  Administered 2017-11-11: .4 mg via INTRAVENOUS

## 2017-11-11 MED ORDER — 0.9 % SODIUM CHLORIDE (POUR BTL) OPTIME
TOPICAL | Status: DC | PRN
  Administered 2017-11-11: 1000 mL

## 2017-11-11 MED ORDER — ENOXAPARIN SODIUM 40 MG/0.4ML ~~LOC~~ SOLN
40.0000 mg | SUBCUTANEOUS | Status: DC
  Administered 2017-11-12: 40 mg via SUBCUTANEOUS
  Filled 2017-11-11: qty 0.4

## 2017-11-11 MED ORDER — SODIUM CHLORIDE 0.9 % IV SOLN
INTRAVENOUS | Status: DC
  Administered 2017-11-11: 07:00:00 via INTRAVENOUS

## 2017-11-11 MED ORDER — MIDAZOLAM HCL 2 MG/2ML IJ SOLN
1.0000 mg | INTRAMUSCULAR | Status: DC
Start: 2017-11-11 — End: 2017-11-11
  Filled 2017-11-11: qty 2

## 2017-11-11 MED ORDER — CEFAZOLIN SODIUM-DEXTROSE 2-4 GM/100ML-% IV SOLN
2.0000 g | INTRAVENOUS | Status: AC
  Administered 2017-11-11: 2 g via INTRAVENOUS
  Filled 2017-11-11: qty 100

## 2017-11-11 MED ORDER — OXYCODONE HCL 5 MG PO TABS
5.0000 mg | ORAL_TABLET | ORAL | Status: DC | PRN
  Administered 2017-11-11: 5 mg via ORAL
  Administered 2017-11-11: 10 mg via ORAL
  Administered 2017-11-12: 5 mg via ORAL
  Filled 2017-11-11: qty 2
  Filled 2017-11-11 (×2): qty 1

## 2017-11-11 MED ORDER — OXYCODONE HCL 5 MG PO TABS: 5.0000 mg | ORAL_TABLET | Freq: Once | ORAL | Status: DC | PRN

## 2017-11-11 MED ORDER — PROPOFOL 10 MG/ML IV BOLUS
INTRAVENOUS | Status: AC
  Filled 2017-11-11: qty 20

## 2017-11-11 MED ORDER — FENTANYL CITRATE (PF) 100 MCG/2ML IJ SOLN: 25.0000 ug | INTRAMUSCULAR | Status: DC | PRN

## 2017-11-11 MED ORDER — METOPROLOL TARTRATE 50 MG PO TABS
50.0000 mg | ORAL_TABLET | Freq: Every day | ORAL | Status: DC
  Administered 2017-11-12: 50 mg via ORAL
  Filled 2017-11-11: qty 1

## 2017-11-11 MED ORDER — LIDOCAINE 2% (20 MG/ML) 5 ML SYRINGE
INTRAMUSCULAR | Status: DC | PRN
  Administered 2017-11-11: 100 mg via INTRAVENOUS

## 2017-11-11 MED ORDER — GLIPIZIDE 5 MG PO TABS
2.5000 mg | ORAL_TABLET | Freq: Two times a day (BID) | ORAL | Status: DC
  Administered 2017-11-11 – 2017-11-12 (×2): 2.5 mg via ORAL
  Filled 2017-11-11 (×2): qty 1

## 2017-11-11 MED ORDER — FENTANYL CITRATE (PF) 100 MCG/2ML IJ SOLN
50.0000 ug | INTRAMUSCULAR | Status: DC
Start: 2017-11-11 — End: 2017-11-11
  Administered 2017-11-11: 50 ug via INTRAVENOUS
  Filled 2017-11-11: qty 2

## 2017-11-11 MED ORDER — HYDROMORPHONE HCL 1 MG/ML IJ SOLN: 0.5000 mg | INTRAMUSCULAR | Status: DC | PRN

## 2017-11-11 MED ORDER — PROPOFOL 10 MG/ML IV BOLUS
INTRAVENOUS | Status: DC | PRN
  Administered 2017-11-11: 140 mg via INTRAVENOUS

## 2017-11-11 MED ORDER — EPHEDRINE SULFATE-NACL 50-0.9 MG/10ML-% IV SOSY
PREFILLED_SYRINGE | INTRAVENOUS | Status: DC | PRN
  Administered 2017-11-11: 15 mg via INTRAVENOUS
  Administered 2017-11-11: 10 mg via INTRAVENOUS

## 2017-11-11 MED ORDER — ROPIVACAINE HCL 7.5 MG/ML IJ SOLN
INTRAMUSCULAR | Status: DC | PRN
  Administered 2017-11-11: 20 mL via PERINEURAL

## 2017-11-11 MED ORDER — ONDANSETRON HCL 4 MG/2ML IJ SOLN
INTRAMUSCULAR | Status: AC
  Filled 2017-11-11: qty 2

## 2017-11-11 MED ORDER — GLYCOPYRROLATE 0.2 MG/ML IV SOSY
PREFILLED_SYRINGE | INTRAVENOUS | Status: AC
  Filled 2017-11-11: qty 3

## 2017-11-11 MED ORDER — ACETAMINOPHEN 500 MG PO TABS
1000.0000 mg | ORAL_TABLET | ORAL | Status: AC
  Administered 2017-11-11: 1000 mg via ORAL
  Filled 2017-11-11: qty 2

## 2017-11-11 MED ORDER — ROCURONIUM BROMIDE 100 MG/10ML IV SOLN
INTRAVENOUS | Status: AC
  Filled 2017-11-11: qty 1

## 2017-11-11 MED ORDER — POTASSIUM CHLORIDE IN NACL 20-0.9 MEQ/L-% IV SOLN
INTRAVENOUS | Status: DC
  Administered 2017-11-11: 12:00:00 via INTRAVENOUS
  Administered 2017-11-12: 1000 mL via INTRAVENOUS
  Filled 2017-11-11 (×2): qty 1000

## 2017-11-11 MED ORDER — LOSARTAN POTASSIUM 25 MG PO TABS
25.0000 mg | ORAL_TABLET | Freq: Every day | ORAL | Status: DC
  Filled 2017-11-11: qty 1

## 2017-11-11 SURGICAL SUPPLY — 37 items
ADH SKN CLS APL DERMABOND .7 (GAUZE/BANDAGES/DRESSINGS) ×1
BLADE SURG 15 STRL LF DISP TIS (BLADE) ×1 IMPLANT
BLADE SURG 15 STRL SS (BLADE) ×3
CHLORAPREP W/TINT 26ML (MISCELLANEOUS) ×3 IMPLANT
DECANTER SPIKE VIAL GLASS SM (MISCELLANEOUS) ×3 IMPLANT
DERMABOND ADVANCED (GAUZE/BANDAGES/DRESSINGS) ×2
DERMABOND ADVANCED .7 DNX12 (GAUZE/BANDAGES/DRESSINGS) ×1 IMPLANT
DRAIN PENROSE 18X1/2 LTX STRL (DRAIN) ×3 IMPLANT
DRAPE LAPAROTOMY TRNSV 102X78 (DRAPE) ×3 IMPLANT
ELECT PENCIL ROCKER SW 15FT (MISCELLANEOUS) ×3 IMPLANT
ELECT REM PT RETURN 15FT ADLT (MISCELLANEOUS) ×3 IMPLANT
GAUZE SPONGE 4X4 12PLY STRL (GAUZE/BANDAGES/DRESSINGS) IMPLANT
GLOVE BIOGEL PI IND STRL 6.5 (GLOVE) IMPLANT
GLOVE BIOGEL PI IND STRL 7.0 (GLOVE) ×2 IMPLANT
GLOVE BIOGEL PI INDICATOR 6.5 (GLOVE) ×4
GLOVE BIOGEL PI INDICATOR 7.0 (GLOVE) ×4
GLOVE SURG SIGNA 7.5 PF LTX (GLOVE) ×3 IMPLANT
GOWN STRL REUS W/ TWL LRG LVL3 (GOWN DISPOSABLE) IMPLANT
GOWN STRL REUS W/TWL LRG LVL3 (GOWN DISPOSABLE) ×6
GOWN STRL REUS W/TWL XL LVL3 (GOWN DISPOSABLE) ×4 IMPLANT
KIT BASIN OR (CUSTOM PROCEDURE TRAY) ×3 IMPLANT
MESH PARIETEX PROGRIP RIGHT (Mesh General) ×2 IMPLANT
NEEDLE HYPO 25X1 1.5 SAFETY (NEEDLE) ×3 IMPLANT
PACK BASIC VI WITH GOWN DISP (CUSTOM PROCEDURE TRAY) ×3 IMPLANT
SPONGE LAP 4X18 RFD (DISPOSABLE) ×3 IMPLANT
SUT MNCRL AB 4-0 PS2 18 (SUTURE) ×3 IMPLANT
SUT SILK 2 0 SH (SUTURE) ×2 IMPLANT
SUT VIC AB 2-0 CT1 27 (SUTURE) ×3
SUT VIC AB 2-0 CT1 TAPERPNT 27 (SUTURE) IMPLANT
SUT VIC AB 3-0 54XBRD REEL (SUTURE) IMPLANT
SUT VIC AB 3-0 BRD 54 (SUTURE) ×3
SUT VIC AB 3-0 SH 27 (SUTURE) ×6
SUT VIC AB 3-0 SH 27XBRD (SUTURE) IMPLANT
SYR CONTROL 10ML LL (SYRINGE) ×3 IMPLANT
TOWEL OR 17X26 10 PK STRL BLUE (TOWEL DISPOSABLE) ×3 IMPLANT
TOWEL OR NON WOVEN STRL DISP B (DISPOSABLE) ×3 IMPLANT
YANKAUER SUCT BULB TIP 10FT TU (MISCELLANEOUS) ×2 IMPLANT

## 2017-11-11 NOTE — Anesthesia Procedure Notes (Signed)
Procedure Name: LMA Insertion Date/Time: 11/11/2017 8:20 AM Performed by: Lind Covert, CRNA Pre-anesthesia Checklist: Patient identified, Emergency Drugs available, Suction available, Patient being monitored and Timeout performed Patient Re-evaluated:Patient Re-evaluated prior to induction Oxygen Delivery Method: Circle system utilized Preoxygenation: Pre-oxygenation with 100% oxygen Induction Type: IV induction LMA: LMA inserted LMA Size: 5.0 Tube type: Oral Number of attempts: 1 Placement Confirmation: positive ETCO2 and breath sounds checked- equal and bilateral Tube secured with: Tape Dental Injury: Teeth and Oropharynx as per pre-operative assessment

## 2017-11-11 NOTE — Anesthesia Preprocedure Evaluation (Signed)
Anesthesia Evaluation  Patient identified by MRN, date of birth, ID band Patient awake    Reviewed: Allergy & Precautions, H&P , NPO status , Patient's Chart, lab work & pertinent test results  Airway Mallampati: II   Neck ROM: full    Dental   Pulmonary asthma , former smoker,  H/o lung CA s/p left pneumonectomy   breath sounds clear to auscultation       Cardiovascular hypertension, + dysrhythmias Atrial Fibrillation  Rhythm:regular Rate:Normal  S/p ablation therapy   Neuro/Psych  Headaches,    GI/Hepatic PUD, GERD  ,  Endo/Other  diabetes, Type 2Hypothyroidism   Renal/GU Renal InsufficiencyRenal disease     Musculoskeletal   Abdominal   Peds  Hematology   Anesthesia Other Findings   Reproductive/Obstetrics                             Anesthesia Physical Anesthesia Plan  ASA: III  Anesthesia Plan: General and Regional   Post-op Pain Management:  Regional for Post-op pain   Induction: Intravenous  PONV Risk Score and Plan: 2 and Ondansetron, Dexamethasone and Treatment may vary due to age or medical condition  Airway Management Planned: LMA  Additional Equipment:   Intra-op Plan:   Post-operative Plan:   Informed Consent: I have reviewed the patients History and Physical, chart, labs and discussed the procedure including the risks, benefits and alternatives for the proposed anesthesia with the patient or authorized representative who has indicated his/her understanding and acceptance.     Plan Discussed with: Anesthesiologist, CRNA and Surgeon  Anesthesia Plan Comments:         Anesthesia Quick Evaluation

## 2017-11-11 NOTE — Transfer of Care (Signed)
Immediate Anesthesia Transfer of Care Note  Patient: Zachary Bean  Procedure(s) Performed: RIGHT INGUINAL HERNIA REPAIR WITH MESH (Right Inguinal) INSERTION OF MESH (Right Inguinal)  Patient Location: PACU  Anesthesia Type:General  Level of Consciousness: sedated  Airway & Oxygen Therapy: Patient Spontanous Breathing and Patient connected to face mask oxygen  Post-op Assessment: Report given to RN and Post -op Vital signs reviewed and stable  Post vital signs: Reviewed and stable  Last Vitals:  Vitals Value Taken Time  BP    Temp    Pulse 84 11/11/2017  8:59 AM  Resp 13 11/11/2017  8:59 AM  SpO2 100 % 11/11/2017  8:59 AM  Vitals shown include unvalidated device data.  Last Pain:  Vitals:   11/11/17 0721  TempSrc: Oral      Patients Stated Pain Goal: 5 (14/64/31 4276)  Complications: No apparent anesthesia complications

## 2017-11-11 NOTE — H&P (Signed)
Zachary Bean is an 77 y.o. male.   Chief Complaint: hernia HPI: This is a gentleman I saw in the office with a symptomatic right inguinal hernia.  He presents today for elective right inguinal hernia repair with mesh.  He is currently without complaints.  Past Medical History:  Diagnosis Date  . Acute idiopathic pericarditis 2008   s/p fluoroscopic pericardial window at [facility]  . Childhood asthma   . Chronic kidney disease    STAGE 2 CKD FOLLOWED BY PRIMARY DR Wynelle Link  . Chronic lower back pain   . Chronic sinusitis   . Duodenal ulcer, chronic   . GERD (gastroesophageal reflux disease)   . Headache    "at least once/week; sometimes more" (07/09/2015)  . History of kidney stones 1960'S  . Hyperlipidemia   . Hypothyroidism   . Left trigger finger   . Lung cancer ([place]) 1995   s/p left pneumonectomy in 1995; also treated with radiation  . Normal echocardiogram 08/2011  . Normal nuclear stress test 08/2011   EF was slightly down but normal by echo; no ischemia or scar noted.   Marland Kitchen NSVT (nonsustained ventricular tachycardia) ([place])   . PAF (paroxysmal atrial fibrillation) ([place]) 10 YRS AGO NOT CURRENT PROBLEM   s/p ablation at [facility]  . Type II diabetes mellitus ([place])     Past Surgical History:  Procedure Laterality Date  . ATRIAL FIBRILLATION ABLATION  early 2000s   "@ [facility]"  . BACK SURGERY    . CARDIAC CATHETERIZATION N/A 07/09/2015   Procedure: Left Heart Cath and Coronary Angiography;  Surgeon: Belva Crome, MD;  Location: [facility];  Service: Cardiovascular;  Laterality: N/A;  . CATARACT EXTRACTION W/ INTRAOCULAR LENS  IMPLANT, BILATERAL Bilateral   . INGUINAL HERNIA REPAIR Left 2000  . LEFT LUNG REMOVED      DR Arlyce Dice LUNG CANCER  . LUMBAR MICRODISCECTOMY  1984   "where sciatic nerve goes thru"  . PNEUMONECTOMY Left 1995   lung cancer  . SKIN CANCER AREAS REMOVED FROM EAR AND CHEST      Family History  Problem Relation Age of Onset  . Stroke Mother   . Heart  failure Father   . Heart failure Unknown   . Stroke Unknown   . Cancer Unknown   . Emphysema Unknown   . Hypertension Unknown   . Diabetes Unknown   . Coronary artery disease Unknown    Social History:  reports that he quit smoking about 24 years ago. His smoking use included cigarettes. He has a 40.00 pack-year smoking history. He has quit using smokeless tobacco. His smokeless tobacco use included chew. He reports that he drinks alcohol. He reports that he does not use drugs.  Allergies:  Allergies  Allergen Reactions  . Contrast Media [Iodinated Diagnostic Agents] Hives and Itching  . Jardiance [Empagliflozin]     DIZZINESS, HEACAHES     No medications prior to admission.    No results found for this or any previous visit (from the past 48 hour(s)). No results found.  Review of Systems  All other systems reviewed and are negative.   There were no vitals taken for this visit. Physical Exam  Constitutional: He is oriented to person, place, and time. He appears well-developed and well-nourished. No distress.  HENT:  Head: Normocephalic and atraumatic.  Right Ear: External ear normal.  Left Ear: External ear normal.  Eyes: Pupils are equal, round, and reactive to light. No scleral icterus.  Neck: Normal  range of motion.  Cardiovascular: Normal heart sounds.  No murmur heard. Irregular rate and rhythm  Respiratory: Effort normal and breath sounds normal. No respiratory distress.  GI: Soft. Bowel sounds are normal. He exhibits no distension. There is no tenderness.  Right inguinal hernia which is reducible  Musculoskeletal: Normal range of motion. He exhibits no edema.  Neurological: He is alert and oriented to person, place, and time.  Skin: Skin is warm and dry. No erythema.  Psychiatric: His behavior is normal. Judgment normal.     Assessment/Plan Right inguinal hernia  I discussed the diagnosis with the patient again.  We discussed right inguinal hernia repair  with mesh.  We discussed procedure in detail.  We discussed the risk which includes but is not limited to bleeding, infection, injury to surrounding structures, nerve entrapment, chronic pain, hernia recurrence, cardiopulmonary issues, postoperative recovery, etc.  He understands and wishes to proceed with surgery which will be scheduled  [name] A, MD 11/11/2017, 6:00 AM

## 2017-11-11 NOTE — Progress Notes (Signed)
AssistedDr. Hodierne with right, ultrasound guided, transabdominal plane block. Side rails up, monitors on throughout procedure. See vital signs in flow sheet. Tolerated Procedure well. ° °

## 2017-11-11 NOTE — Anesthesia Postprocedure Evaluation (Signed)
Anesthesia Post Note  Patient: Zachary Bean  Procedure(s) Performed: RIGHT INGUINAL HERNIA REPAIR WITH MESH (Right Inguinal) INSERTION OF MESH (Right Inguinal)     Patient location during evaluation: PACU Anesthesia Type: Regional and General Level of consciousness: awake and alert Pain management: pain level controlled Vital Signs Assessment: post-procedure vital signs reviewed and stable Respiratory status: spontaneous breathing, nonlabored ventilation, respiratory function stable and patient connected to nasal cannula oxygen Cardiovascular status: blood pressure returned to baseline and stable Postop Assessment: no apparent nausea or vomiting Anesthetic complications: no    Last Vitals:  Vitals:   11/11/17 0955 11/11/17 1000  BP: 111/61 111/61  Pulse: 76 76  Resp: 16 16  Temp: 36.4 C 36.4 C  SpO2: 98% 98%    Last Pain:  Vitals:   11/11/17 1000  TempSrc: Oral  PainSc: 0-No pain                 Herminio Kniskern S

## 2017-11-11 NOTE — Op Note (Signed)
RIGHT INGUINAL HERNIA REPAIR WITH MESH, INSERTION OF MESH  Procedure Note  Mihran Lebarron 11/11/2017   Pre-op Diagnosis: RIGHT INGUINAL HERNIA     Post-op Diagnosis: same  Procedure(s): RIGHT INGUINAL HERNIA REPAIR WITH MESH INSERTION OF MESH  Surgeon(s): Coralie Keens, MD  Anesthesia: Choice  Staff:  Circulator: Anderson Malta, RN Scrub Person: Joellen Jersey, CST  Estimated Blood Loss: Minimal               Findings: The patient was found to have a direct right inguinal hernia which was repaired with a piece of large pro-grip Prolene mesh  Procedure: The patient was brought to the operating room and identified as the correct patient.  He already had a tap block performed on the right side by anesthesiology.  He was placed upon the operating room table and general anesthesia was induced.  His abdomen was then prepped and draped in the usual sterile fashion.  I anesthetized the skin in the right lower quadrant with Marcaine.  I then made a transverse incision with a scalpel.  I took this down through Scarpa's fascia with electrocautery.  The external oblique fascia was then identified and opened toward the internal and external rings.  The testicular cord and structures were controlled with a Penrose drain.  The patient had a large direct hernia defect without evidence of indirect hernia.  I indicated the floor of the new clinic inguinal canal reducing the hernia sac with 2-0 Vicryl sutures.  A large piece of Prolene pro-grip mesh was brought to the field.  I placed it as an onlay against the pubic tubercle and brought around the cord structures.  I then sutured in place with 2-0 Vicryl sutures.  Wide coverage of the inguinal floor and internal ring appeared to be achieved.  I then closed the external oblique fascia over the top of the mesh with running 2-0 Vicryl sutures.  I then closed Scarpa's fascia with interrupted 3-0 Vicryl sutures and closed the skin with a running 4-0  Monocryl.  Dermabond was then applied.  The patient tolerated the procedure well.  All counts were correct at the end of procedure.  The patient was then extubated in the operating room and taken in a stable condition to the recovery room.          Jaecob Lowden A   Date: 11/11/2017  Time: 8:54 AM

## 2017-11-11 NOTE — Anesthesia Procedure Notes (Signed)
Anesthesia Regional Block: TAP block   Pre-Anesthetic Checklist: ,, timeout performed, Correct Patient, Correct Site, Correct Laterality, Correct Procedure, Correct Position, site marked, Risks and benefits discussed,  Surgical consent,  Pre-op evaluation,  At surgeon's request and post-op pain management  Laterality: Right  Prep: chloraprep       Needles:  Injection technique: Single-shot  Needle Type: Echogenic Needle     Needle Length: 9cm  Needle Gauge: 21     Additional Needles:   Narrative:  Start time: 11/11/2017 7:57 AM End time: 11/11/2017 8:03 AM Injection made incrementally with aspirations every 5 mL.  Performed by: Personally  Anesthesiologist: Albertha Ghee, MD  Additional Notes: Pt tolerated the procedure well.

## 2017-11-11 NOTE — Plan of Care (Signed)
Plan of care 

## 2017-11-12 ENCOUNTER — Encounter (HOSPITAL_COMMUNITY): Payer: Self-pay | Admitting: Surgery

## 2017-11-12 DIAGNOSIS — K4090 Unilateral inguinal hernia, without obstruction or gangrene, not specified as recurrent: Secondary | ICD-10-CM | POA: Diagnosis not present

## 2017-11-12 MED ORDER — TRAMADOL HCL 50 MG PO TABS: 50.0000 mg | ORAL_TABLET | Freq: Four times a day (QID) | ORAL | 0 refills | Status: AC | PRN

## 2017-11-12 NOTE — Plan of Care (Signed)
Pt alert and oriented, complaints of soreness. Ambulating in hall, voiding, and tolerating diet well. Plan to d/c pt per MD order. RN will monitor.

## 2017-11-12 NOTE — Discharge Instructions (Signed)
[facility] _______[facility] ° °UMBILICAL OR INGUINAL HERNIA REPAIR: POST OP INSTRUCTIONS ° °Always review your discharge instruction sheet given to you by the facility where your surgery was performed. °IF YOU HAVE DISABILITY OR FAMILY LEAVE FORMS, YOU MUST BRING THEM TO THE OFFICE FOR PROCESSING.   °DO NOT GIVE THEM TO YOUR DOCTOR. ° °1. A  prescription for pain medication may be given to you upon discharge.  Take your pain medication as prescribed, if needed.  If narcotic pain medicine is not needed, then you may take acetaminophen (Tylenol) or ibuprofen (Advil) as needed. °2. Take your usually prescribed medications unless otherwise directed. °If you need a refill on your pain medication, please contact your pharmacy.  They will contact our office to request authorization. Prescriptions will not be filled after 5 pm or on week-ends. °3. You should follow a light diet the first 24 hours after arrival home, such as soup and crackers, etc.  Be sure to include lots of fluids daily.  Resume your normal diet the day after surgery. °4.Most patients will experience some swelling and bruising around the umbilicus or in the groin and scrotum.  Ice packs and reclining will help.  Swelling and bruising can take several days to resolve.  °6. It is common to experience some constipation if taking pain medication after surgery.  Increasing fluid intake and taking a stool softener (such as Colace) will usually help or prevent this problem from occurring.  A mild laxative (Milk of Magnesia or Miralax) should be taken according to package directions if there are no bowel movements after 48 hours. °7. Unless discharge instructions indicate otherwise, you may remove your bandages 24-48 hours after surgery, and you may shower at that time.  You may have steri-strips (small skin tapes) in place directly over the incision.  These strips should be left on the skin for 7-10 days.  If your surgeon used skin glue on the  incision, you may shower in 24 hours.  The glue will flake off over the next 2-3 weeks.  Any sutures or staples will be removed at the office during your follow-up visit. °8. ACTIVITIES:  You may resume regular (light) daily activities beginning the next day--such as daily self-care, walking, climbing stairs--gradually increasing activities as tolerated.  You may have sexual intercourse when it is comfortable.  Refrain from any heavy lifting or straining until approved by your doctor. ° °a.You may drive when you are no longer taking prescription pain medication, you can comfortably wear a seatbelt, and you can safely maneuver your car and apply brakes. °b.RETURN TO WORK:   °_____________________________________________ ° °9.You should see your doctor in the office for a follow-up appointment approximately 2-3 weeks after your surgery.  Make sure that you call for this appointment within a day or two after you arrive home to insure a convenient appointment time. °10.OTHER INSTRUCTIONS: _________________________ °   _____________________________________ ° °WHEN TO CALL YOUR DOCTOR: °1. Fever over 101.0 °2. Inability to urinate °3. Nausea and/or vomiting °4. Extreme swelling or bruising °5. Continued bleeding from incision. °6. Increased pain, redness, or drainage from the incision ° °The clinic staff is available to answer your questions during regular business hours.  Please don’t hesitate to call and ask to speak to one of the nurses for clinical concerns.  If you have a medical emergency, go to the nearest emergency room or call 911.  A surgeon from [facility] is always on call at the hospital ° ° °  [address], [address], [place], [place]  [address] ? ° [address] [address], [place], [place]   [address] °[phone] ? [phone] ? FAX [phone] °Web site: [email or web address] °

## 2017-11-12 NOTE — Discharge Summary (Signed)
Physician Discharge Summary  Patient ID: Zachary Bean MRN: [id number] DOB/AGE: [birth year] 77 y.o.  Admit date: 11/11/2017 Discharge date: 11/12/2017  Admission Diagnoses:  Discharge Diagnoses:  Active Problems:   S/P right inguinal hernia repair   Discharged Condition: good  Hospital Course: uneventful post op recovery.  Discharged home POD#1  Consults: None  Significant Diagnostic Studies:   Treatments: surgery: right inguinal hernia repair with mesh  Discharge Exam: Blood pressure 119/66, pulse 95, temperature 98.7 F (37.1 C), temperature source Oral, resp. rate 18, height 5\' 10"  (1.778 m), weight 78.9 kg (174 lb), SpO2 96 %. General appearance: alert, cooperative and no distress Resp: clear to auscultation bilaterally Incision/Wound:abdomen soft, incision clean  Disposition:    Allergies as of 11/12/2017      Reactions   Contrast Media [iodinated Diagnostic Agents] Hives, Itching   Jardiance [empagliflozin]    DIZZINESS, HEACAHES       Medication List    TAKE these medications   acetaminophen 500 MG tablet Commonly known as:  TYLENOL Take 500 mg by mouth every 6 (six) hours as needed for moderate pain or headache.   budesonide-formoterol 160-4.5 MCG/ACT inhaler Commonly known as:  SYMBICORT Inhale 2 puffs into the lungs 2 (two) times daily as needed (for shortness of breath).   glipiZIDE 5 MG tablet Commonly known as:  GLUCOTROL Take 2.5 mg by mouth 2 (two) times daily before a meal.   levothyroxine 125 MCG tablet Commonly known as:  SYNTHROID, LEVOTHROID Take 125 mcg by mouth daily.   losartan 50 MG tablet Commonly known as:  COZAAR Take 25 mg by mouth daily.   metoprolol tartrate 50 MG tablet Commonly known as:  LOPRESSOR Take 50 mg by mouth daily.   nitroGLYCERIN 0.4 MG SL tablet Commonly known as:  NITROSTAT Place 1 tablet (0.4 mg total) under the tongue every 5 (five) minutes as needed for chest pain.   OVER THE COUNTER MEDICATION Place  2 drops into both ears daily as needed (for ear aches). H[place]ands Ear Ache Drops   traMADol 50 MG tablet Commonly known as:  ULTRAM Take 1 tablet (50 mg total) by mouth every 6 (six) hours as needed for moderate pain or severe pain.      Follow-up Information    Coralie Keens, MD. Schedule an appointment as soon as possible for a visit in 3 week(s).   Specialty:  General Surgery Contact information: [address]  [place] [address] [phone]           Signed: Harl Bowie 11/12/2017, 6:44 AM

## 2017-11-12 NOTE — Progress Notes (Signed)
Patient ID: Zachary Bean, male   DOB: [birth year], 77 y.o.   MRN: [id number]   Doing well Incision clean Wants to go home  Plan: D/c

## 2017-11-12 NOTE — Progress Notes (Signed)
Discharge summary discussed with pt. He demonstrated understanding and questions were answered.  Pt escorted by wheelchair to main lobby in stable condition.

## 2017-11-24 DIAGNOSIS — L814 Other melanin hyperpigmentation: Secondary | ICD-10-CM | POA: Diagnosis not present

## 2017-11-24 DIAGNOSIS — D225 Melanocytic nevi of trunk: Secondary | ICD-10-CM | POA: Diagnosis not present

## 2017-11-24 DIAGNOSIS — L821 Other seborrheic keratosis: Secondary | ICD-10-CM | POA: Diagnosis not present

## 2017-11-24 DIAGNOSIS — L570 Actinic keratosis: Secondary | ICD-10-CM | POA: Diagnosis not present

## 2017-11-24 DIAGNOSIS — L820 Inflamed seborrheic keratosis: Secondary | ICD-10-CM | POA: Diagnosis not present

## 2017-11-24 DIAGNOSIS — Z85828 Personal history of other malignant neoplasm of skin: Secondary | ICD-10-CM | POA: Diagnosis not present

## 2017-12-07 DIAGNOSIS — I25119 Atherosclerotic heart disease of native coronary artery with unspecified angina pectoris: Secondary | ICD-10-CM | POA: Diagnosis not present

## 2017-12-07 DIAGNOSIS — I10 Essential (primary) hypertension: Secondary | ICD-10-CM | POA: Diagnosis not present

## 2017-12-07 DIAGNOSIS — I480 Paroxysmal atrial fibrillation: Secondary | ICD-10-CM | POA: Diagnosis not present

## 2017-12-07 DIAGNOSIS — J449 Chronic obstructive pulmonary disease, unspecified: Secondary | ICD-10-CM | POA: Diagnosis not present

## 2017-12-07 DIAGNOSIS — Z79899 Other long term (current) drug therapy: Secondary | ICD-10-CM | POA: Diagnosis not present

## 2017-12-07 DIAGNOSIS — N183 Chronic kidney disease, stage 3 (moderate): Secondary | ICD-10-CM | POA: Diagnosis not present

## 2017-12-07 DIAGNOSIS — E559 Vitamin D deficiency, unspecified: Secondary | ICD-10-CM | POA: Diagnosis not present

## 2017-12-07 DIAGNOSIS — E1165 Type 2 diabetes mellitus with hyperglycemia: Secondary | ICD-10-CM | POA: Diagnosis not present

## 2017-12-07 DIAGNOSIS — G43909 Migraine, unspecified, not intractable, without status migrainosus: Secondary | ICD-10-CM | POA: Diagnosis not present

## 2017-12-07 DIAGNOSIS — E039 Hypothyroidism, unspecified: Secondary | ICD-10-CM | POA: Diagnosis not present

## 2017-12-07 DIAGNOSIS — Z0000 Encounter for general adult medical examination without abnormal findings: Secondary | ICD-10-CM | POA: Diagnosis not present

## 2017-12-07 DIAGNOSIS — R351 Nocturia: Secondary | ICD-10-CM | POA: Diagnosis not present

## 2018-03-10 DIAGNOSIS — E11319 Type 2 diabetes mellitus with unspecified diabetic retinopathy without macular edema: Secondary | ICD-10-CM | POA: Diagnosis not present

## 2018-03-10 DIAGNOSIS — N182 Chronic kidney disease, stage 2 (mild): Secondary | ICD-10-CM | POA: Diagnosis not present

## 2018-03-10 DIAGNOSIS — Z79899 Other long term (current) drug therapy: Secondary | ICD-10-CM | POA: Diagnosis not present

## 2018-03-10 DIAGNOSIS — I129 Hypertensive chronic kidney disease with stage 1 through stage 4 chronic kidney disease, or unspecified chronic kidney disease: Secondary | ICD-10-CM | POA: Diagnosis not present

## 2018-03-10 DIAGNOSIS — E559 Vitamin D deficiency, unspecified: Secondary | ICD-10-CM | POA: Diagnosis not present

## 2018-03-10 DIAGNOSIS — I25119 Atherosclerotic heart disease of native coronary artery with unspecified angina pectoris: Secondary | ICD-10-CM | POA: Diagnosis not present

## 2018-06-17 ENCOUNTER — Telehealth: Payer: Self-pay | Admitting: Oncology

## 2018-06-17 ENCOUNTER — Ambulatory Visit (HOSPITAL_COMMUNITY)
Admission: RE | Admit: 2018-06-17 | Discharge: 2018-06-17 | Disposition: A | Discharge status: Home / Self Care | Payer: No Typology Code available for payment source | Source: Ambulatory Visit | Attending: Oncology | Admitting: Oncology

## 2018-06-17 ENCOUNTER — Inpatient Hospital Stay: Payer: No Typology Code available for payment source | Attending: Oncology | Admitting: Oncology

## 2018-06-17 VITALS — BP 126/66 | HR 84 | Temp 97.5°F | Resp 18 | Ht 69.0 in | Wt 180.8 lb

## 2018-06-17 DIAGNOSIS — I4891 Unspecified atrial fibrillation: Secondary | ICD-10-CM | POA: Insufficient documentation

## 2018-06-17 DIAGNOSIS — Z902 Acquired absence of lung [part of]: Secondary | ICD-10-CM | POA: Insufficient documentation

## 2018-06-17 DIAGNOSIS — C3492 Malignant neoplasm of unspecified part of left bronchus or lung: Secondary | ICD-10-CM | POA: Diagnosis not present

## 2018-06-17 DIAGNOSIS — Z79899 Other long term (current) drug therapy: Secondary | ICD-10-CM | POA: Diagnosis not present

## 2018-06-17 DIAGNOSIS — I10 Essential (primary) hypertension: Secondary | ICD-10-CM | POA: Insufficient documentation

## 2018-06-17 DIAGNOSIS — R0609 Other forms of dyspnea: Secondary | ICD-10-CM | POA: Diagnosis not present

## 2018-06-17 DIAGNOSIS — Z85118 Personal history of other malignant neoplasm of bronchus and lung: Secondary | ICD-10-CM | POA: Diagnosis not present

## 2018-06-17 NOTE — Progress Notes (Signed)
  [facility] OFFICE PROGRESS NOTE   Diagnosis: Non-small cell lung cancer  INTERVAL HISTORY:   Mr. Schweitzer returns as scheduled.  He generally feels well.  He has chronic exertional dyspnea.  He reports an episode of nausea last week.  He has intermittent "cramps "in the upper abdomen that resolved after a few minutes.  Good appetite.  Objective:  Vital signs in last 24 hours:  Blood pressure 126/66, pulse 84, temperature (!) 97.5 F (36.4 C), temperature source Oral, resp. rate 18, height 5\' 9"  (1.753 m), weight 180 lb 12.8 oz (82 kg), SpO2 97 %.    HEENT: Neck without mass Lymphatics: No cervical, supraclavicular, or axillary nodes Resp: Decreased breath sounds throughout the left chest, no respiratory distress Cardio: Regular rate and rhythm GI: No hepatosplenomegaly, no mass, nontender Vascular: No leg edema  Skin: Left thoracotomy site without evidence of recurrent tumor   Imaging:  Dg Chest 2 View  Result Date: 06/17/2018 CLINICAL DATA:  Lung carcinoma with previous pneumonectomy on the left EXAM: CHEST - 2 VIEW COMPARISON:  June 17, 2017 FINDINGS: There is volume loss on the left consistent with pneumonectomy. There is residual air-fluid level in the left apex region with opacification of the remainder of the left hemithorax. The right lung is clear. The heart size appears grossly normal. Pulmonary vascularity on the right appears normal. No adenopathy is evident in areas that can be assessed. No bone lesions. There is aortic atherosclerosis. IMPRESSION: Status post left pneumonectomy with volume loss toward the left. There is opacification of most of the left hemithorax, although there remains an air-fluid level in the left apex which appears essentially stable. Right lung is clear. Stable cardiac silhouette. There is aortic atherosclerosis. Aortic Atherosclerosis (ICD10-I70.0). Electronically Signed   By: Lowella Grip III M.D.   On: 06/17/2018 07:38     Medications: I have reviewed the patient's current medications.   Assessment/Plan:  1. Stage III non-small cell lung cancer diagnosed in September 1995. He remains in clinical remission. 2. History of a cough following an ACE inhibitor. 3. Admission to [facility] in February 2008 with atrial fibrillation and a pericardial effusion.   Disposition: Mr. Cho remains in clinical remission from lung cancer.  He would like to continue follow-up at the Cancer center.  He will return for an office visit and chest x-ray in 1 year. Mr. Bacallao continues internal medicine care with Dr. Stephanie Acre.  Betsy Coder, MD  06/17/2018  8:21 AM

## 2018-06-17 NOTE — Telephone Encounter (Signed)
Scheduled appt per 01/24 los.  Printed calendar and avs.

## 2019-01-12 DIAGNOSIS — R0602 Shortness of breath: Secondary | ICD-10-CM | POA: Diagnosis not present

## 2019-01-12 DIAGNOSIS — Z79899 Other long term (current) drug therapy: Secondary | ICD-10-CM | POA: Diagnosis not present

## 2019-01-12 DIAGNOSIS — I480 Paroxysmal atrial fibrillation: Secondary | ICD-10-CM | POA: Diagnosis not present

## 2019-01-12 DIAGNOSIS — E1121 Type 2 diabetes mellitus with diabetic nephropathy: Secondary | ICD-10-CM | POA: Diagnosis not present

## 2019-01-12 DIAGNOSIS — I272 Pulmonary hypertension, unspecified: Secondary | ICD-10-CM | POA: Diagnosis not present

## 2019-01-12 DIAGNOSIS — E039 Hypothyroidism, unspecified: Secondary | ICD-10-CM | POA: Diagnosis not present

## 2019-01-12 DIAGNOSIS — N183 Chronic kidney disease, stage 3 (moderate): Secondary | ICD-10-CM | POA: Diagnosis not present

## 2019-01-12 DIAGNOSIS — I25119 Atherosclerotic heart disease of native coronary artery with unspecified angina pectoris: Secondary | ICD-10-CM | POA: Diagnosis not present

## 2019-01-12 DIAGNOSIS — E559 Vitamin D deficiency, unspecified: Secondary | ICD-10-CM | POA: Diagnosis not present

## 2019-01-12 DIAGNOSIS — I129 Hypertensive chronic kidney disease with stage 1 through stage 4 chronic kidney disease, or unspecified chronic kidney disease: Secondary | ICD-10-CM | POA: Diagnosis not present

## 2019-01-12 DIAGNOSIS — I10 Essential (primary) hypertension: Secondary | ICD-10-CM | POA: Diagnosis not present

## 2019-01-12 DIAGNOSIS — Z0000 Encounter for general adult medical examination without abnormal findings: Secondary | ICD-10-CM | POA: Diagnosis not present

## 2019-01-16 ENCOUNTER — Emergency Department (HOSPITAL_COMMUNITY)
Admission: EM | Admit: 2019-01-16 | Discharge: 2019-01-16 | Disposition: A | Discharge status: Home / Self Care | Payer: PPO | Attending: Emergency Medicine | Admitting: Emergency Medicine

## 2019-01-16 ENCOUNTER — Emergency Department (HOSPITAL_COMMUNITY): Payer: PPO

## 2019-01-16 ENCOUNTER — Other Ambulatory Visit: Payer: Self-pay

## 2019-01-16 ENCOUNTER — Encounter (HOSPITAL_COMMUNITY): Payer: Self-pay

## 2019-01-16 DIAGNOSIS — R079 Chest pain, unspecified: Secondary | ICD-10-CM

## 2019-01-16 DIAGNOSIS — Z7982 Long term (current) use of aspirin: Secondary | ICD-10-CM | POA: Diagnosis not present

## 2019-01-16 DIAGNOSIS — N182 Chronic kidney disease, stage 2 (mild): Secondary | ICD-10-CM | POA: Diagnosis not present

## 2019-01-16 DIAGNOSIS — Z87891 Personal history of nicotine dependence: Secondary | ICD-10-CM | POA: Diagnosis not present

## 2019-01-16 DIAGNOSIS — R0789 Other chest pain: Secondary | ICD-10-CM | POA: Insufficient documentation

## 2019-01-16 DIAGNOSIS — E1122 Type 2 diabetes mellitus with diabetic chronic kidney disease: Secondary | ICD-10-CM | POA: Diagnosis not present

## 2019-01-16 DIAGNOSIS — E039 Hypothyroidism, unspecified: Secondary | ICD-10-CM | POA: Diagnosis not present

## 2019-01-16 DIAGNOSIS — I10 Essential (primary) hypertension: Secondary | ICD-10-CM | POA: Diagnosis not present

## 2019-01-16 DIAGNOSIS — Z79899 Other long term (current) drug therapy: Secondary | ICD-10-CM | POA: Insufficient documentation

## 2019-01-16 DIAGNOSIS — I959 Hypotension, unspecified: Secondary | ICD-10-CM | POA: Diagnosis not present

## 2019-01-16 DIAGNOSIS — R0689 Other abnormalities of breathing: Secondary | ICD-10-CM | POA: Diagnosis not present

## 2019-01-16 DIAGNOSIS — Z85118 Personal history of other malignant neoplasm of bronchus and lung: Secondary | ICD-10-CM | POA: Insufficient documentation

## 2019-01-16 DIAGNOSIS — I4891 Unspecified atrial fibrillation: Secondary | ICD-10-CM | POA: Insufficient documentation

## 2019-01-16 DIAGNOSIS — I129 Hypertensive chronic kidney disease with stage 1 through stage 4 chronic kidney disease, or unspecified chronic kidney disease: Secondary | ICD-10-CM | POA: Diagnosis not present

## 2019-01-16 LAB — BASIC METABOLIC PANEL
Anion gap: 10 (ref 5–15)
BUN: 7 mg/dL — ABNORMAL LOW (ref 8–23)
CO2: 23 mmol/L (ref 22–32)
Calcium: 9 mg/dL (ref 8.9–10.3)
Chloride: 107 mmol/L (ref 98–111)
Creatinine, Ser: 0.86 mg/dL (ref 0.61–1.24)
GFR calc Af Amer: >60 mL/min (ref >60)
GFR calc non Af Amer: >60 mL/min (ref >60)
Glucose, Bld: 111 mg/dL — ABNORMAL HIGH (ref 70–99)
Potassium: 3.9 mmol/L (ref 3.5–5.1)
Sodium: 140 mmol/L (ref 135–145)

## 2019-01-16 LAB — CBC WITH DIFFERENTIAL/PLATELET
Abs Immature Granulocytes: 0.06 10*3/uL (ref 0.00–0.07)
Basophils Absolute: 0.1 10*3/uL (ref 0.0–0.1)
Basophils Relative: 1 %
Eosinophils Absolute: 0.5 10*3/uL (ref 0.0–0.5)
Eosinophils Relative: 6 %
HCT: 41.3 % (ref 39.0–52.0)
Hemoglobin: 13.8 g/dL (ref 13.0–17.0)
Immature Granulocytes: 1 %
Lymphocytes Relative: 20 %
Lymphs Abs: 1.7 10*3/uL (ref 0.7–4.0)
MCH: 31.3 pg (ref 26.0–34.0)
MCHC: 33.4 g/dL (ref 30.0–36.0)
MCV: 93.7 fL (ref 80.0–100.0)
Monocytes Absolute: 0.6 10*3/uL (ref 0.1–1.0)
Monocytes Relative: 7 %
Neutro Abs: 5.4 10*3/uL (ref 1.7–7.7)
Neutrophils Relative %: 65 %
Platelets: 258 10*3/uL (ref 150–400)
RBC: 4.41 MIL/uL (ref 4.22–5.81)
RDW: 11.8 % (ref 11.5–15.5)
WBC: 8.3 10*3/uL (ref 4.0–10.5)
nRBC: 0 % (ref 0.0–0.2)

## 2019-01-16 LAB — TROPONIN I (HIGH SENSITIVITY)
Troponin I (High Sensitivity): 8 ng/L (ref <18)
Troponin I (High Sensitivity): 8 ng/L (ref <18)

## 2019-01-16 LAB — D-DIMER, QUANTITATIVE: D-Dimer, Quant: 1.11 ug/mL-FEU — ABNORMAL HIGH (ref 0.00–0.50)

## 2019-01-16 MED ORDER — ASPIRIN 81 MG PO CHEW
324.0000 mg | CHEWABLE_TABLET | Freq: Once | ORAL | Status: DC
  Filled 2019-01-16: qty 4

## 2019-01-16 MED ORDER — TECHNETIUM TO 99M ALBUMIN AGGREGATED
1.5400 | Freq: Once | INTRAVENOUS | Status: AC | PRN
  Administered 2019-01-16: 1.54 via INTRAVENOUS

## 2019-01-16 NOTE — ED Notes (Signed)
Spoke to [facility] and  They said maybe by 1 pm

## 2019-01-16 NOTE — ED Provider Notes (Signed)
[facility] EMERGENCY DEPARTMENT Provider Note   CSN: [id number] Arrival date & time: 01/16/19  6712     History   Chief Complaint Chief Complaint  Patient presents with  . Chest Pain    HPI Matt Delpizzo is a 78 y.o. male with past medical history of hypertension, diabetes, atrial fibrillation, pneumonectomy who presents with sharp chest pain that radiates to the jaw and left arm and began this morning at 3:30 AM.  Pain is non-positional, nonexertional, slightly improved with nitro (received at 4:30 AM, 7:30 AM and 8 AM), and worse with deep breathing.  Patient also endorses diaphoresis at onset of pain.  Patient took aspirin at beginning of episodes.  Patient states the pain last occurred about a week ago at which point he saw his primary care provider and an EKG and blood work was performed which were nonrevealing.  Patient reports at least 30-year history of smoking but last smoked over 25 years ago.  Patient endorses history of hypertension, diabetes and atrial fibrillation.     HPI  Past Medical History:  Diagnosis Date  . Acute idiopathic pericarditis 2008   s/p fluoroscopic pericardial window at [facility]  . Childhood asthma   . Chronic kidney disease    STAGE 2 CKD FOLLOWED BY PRIMARY DR Wynelle Link  . Chronic lower back pain   . Chronic sinusitis   . Duodenal ulcer, chronic   . GERD (gastroesophageal reflux disease)   . Headache    "at least once/week; sometimes more" (07/09/2015)  . History of kidney stones 1960'S  . Hyperlipidemia   . Hypothyroidism   . Left trigger finger   . Lung cancer ([place]) 1995   s/p left pneumonectomy in 1995; also treated with radiation  . Normal echocardiogram 08/2011  . Normal nuclear stress test 08/2011   EF was slightly down but normal by echo; no ischemia or scar noted.   Marland Kitchen NSVT (nonsustained ventricular tachycardia) ([place])   . PAF (paroxysmal atrial fibrillation) ([place]) 10 YRS AGO NOT CURRENT PROBLEM   s/p ablation at  [facility]  . Type II diabetes mellitus [facility])     Patient Active Problem List   Diagnosis Date Noted  . S/P right inguinal hernia repair 11/11/2017  . Chest pain with low risk for cardiac etiology 07/10/2015  . Type 2 diabetes mellitus with hyperglycemia ([place]) 07/09/2015  . History of pericarditis 07/09/2015  . Hypothyroidism 07/09/2015  . Essential hypertension 07/09/2015  . ARF (acute renal failure) ([place]) 07/09/2015  . Pain in the chest   . PAF (paroxysmal atrial fibrillation) ([facility])   . Acute idiopathic pericarditis   . Chest pain 09/21/2011  . NSVT (nonsustained ventricular tachycardia) ([place]) 09/21/2011    Past Surgical History:  Procedure Laterality Date  . ATRIAL FIBRILLATION ABLATION  early 2000s   "@ [facility]"  . BACK SURGERY    . CARDIAC CATHETERIZATION N/A 07/09/2015   Procedure: Left Heart Cath and Coronary Angiography;  Surgeon: Belva Crome, MD;  Location: [facility] CV LAB;  Service: Cardiovascular;  Laterality: N/A;  . CATARACT EXTRACTION W/ INTRAOCULAR LENS  IMPLANT, BILATERAL Bilateral   . INGUINAL HERNIA REPAIR Left 2000  . INGUINAL HERNIA REPAIR Right 11/11/2017   Procedure: RIGHT INGUINAL HERNIA REPAIR WITH MESH;  Surgeon: Coralie Keens, MD;  Location: [facility] ORS;  Service: General;  Laterality: Right;  . INSERTION OF MESH Right 11/11/2017   Procedure: INSERTION OF MESH;  Surgeon: Coralie Keens, MD;  Location: [facility] ORS;  Service: General;  Laterality:  Right;  Marland Kitchen LEFT LUNG REMOVED      DR Arlyce Dice LUNG CANCER  . LUMBAR MICRODISCECTOMY  1984   "where sciatic nerve goes thru"  . PNEUMONECTOMY Left 1995   lung cancer  . SKIN CANCER AREAS REMOVED FROM EAR AND CHEST          Home Medications    Prior to Admission medications   Medication Sig Start Date End Date Taking? Authorizing Provider  acetaminophen (TYLENOL) 500 MG tablet Take 500 mg by mouth every 6 (six) hours as needed for moderate pain or headache.   Yes [provider]  aspirin 81 MG chewable  tablet Chew 324 mg by mouth once.   Yes [provider]  budesonide-formoterol (SYMBICORT) 160-4.5 MCG/ACT inhaler Inhale 2 puffs into the lungs 2 (two) times daily as needed (for shortness of breath).   Yes [provider]  cholecalciferol (VITAMIN D3) 25 MCG (1000 UT) tablet Take 1,000 Units by mouth daily.   Yes [provider]  Cyanocobalamin (VITAMIN B 12 PO) Take 1 tablet by mouth daily.   Yes [provider]  esomeprazole (NEXIUM) 40 MG capsule Take 40 mg by mouth daily at 12 noon.   Yes [provider]  glimepiride (AMARYL) 2 MG tablet Take 2 mg by mouth daily. 06/07/18  Yes [provider]  levothyroxine (SYNTHROID) 100 MCG tablet Take 100 mcg by mouth daily before breakfast.   Yes [provider]  losartan (COZAAR) 50 MG tablet Take 25 mg by mouth daily.  06/01/11  Yes [provider]  metoprolol (LOPRESSOR) 50 MG tablet Take 50 mg by mouth daily. 06/12/13  Yes [provider]  ONE TOUCH ULTRA TEST test strip  04/11/18  Yes [provider]  OVER THE COUNTER MEDICATION Place 2 drops into both ears daily as needed (for ear aches). Hylands Ear Ache Drops   Yes [provider]  levothyroxine (SYNTHROID, LEVOTHROID) 125 MCG tablet Take 125 mcg by mouth daily. 04/12/14   [provider]  nitroGLYCERIN (NITROSTAT) 0.4 MG SL tablet Place 1 tablet (0.4 mg total) under the tongue every 5 (five) minutes as needed for chest pain. 09/06/11 11/01/17  Richardson Dopp T, PA-C  traMADol (ULTRAM) 50 MG tablet Take 1 tablet (50 mg total) by mouth every 6 (six) hours as needed for moderate pain or severe pain. Patient not taking: Reported on 01/16/2019 11/12/17   Coralie Keens, MD    Family History Family History  Problem Relation Age of Onset  . Stroke Mother   . Heart failure Father   . Heart failure Other   . Stroke Other   . Cancer Other   . Emphysema Other   . Hypertension Other   . Diabetes  Other   . Coronary artery disease Other     Social History Social History   Tobacco Use  . Smoking status: Former Smoker    Packs/day: 1.00    Years: 40.00    Pack years: 40.00    Types: Cigarettes    Quit date: 07/23/1993    Years since quitting: 25.5  . Smokeless tobacco: Former Systems developer    Types: Chew  . Tobacco comment: "chewed when I was real young"  Substance Use Topics  . Alcohol use: Yes    Comment: "drank a little alcohol; quit when I was 78 years old"  . Drug use: No     Allergies   Iodinated diagnostic agents, Doxycycline, and Empagliflozin   Review of Systems Review of  Systems  Constitutional: Negative for chills and fatigue.  HENT: Negative for congestion.   Respiratory: Negative for cough and shortness of breath.   Cardiovascular: Positive for chest pain.  Gastrointestinal: Negative for abdominal pain, nausea and vomiting.  Genitourinary: Negative for dysuria.  All other systems reviewed and are negative.    Physical Exam Updated Vital Signs BP 113/84 (BP Location: Left Arm)   Pulse 80   Temp 97.7 F (36.5 C) (Oral)   Resp 18   SpO2 97%   Physical Exam Constitutional:      Appearance: Normal appearance.  HENT:     Head: Normocephalic and atraumatic.     Right Ear: External ear normal.     Left Ear: External ear normal.  Neck:     Musculoskeletal: Neck supple.  Cardiovascular:     Rate and Rhythm: Normal rate and regular rhythm.     Heart sounds: Normal heart sounds. No murmur. No friction rub. No gallop.   Pulmonary:     Breath sounds: No wheezing, rhonchi or rales.     Comments: Absent breath sounds on left Chest:     Chest wall: Tenderness (tenderness to palpation at site of chest pain) present.  Abdominal:     General: Abdomen is flat. There is no distension.     Palpations: Abdomen is soft.     Tenderness: There is no abdominal tenderness. There is no guarding.  Musculoskeletal:        General: No swelling or tenderness.  Skin:     General: Skin is warm and dry.  Neurological:     Mental Status: He is alert.  Psychiatric:        Mood and Affect: Mood normal.        Behavior: Behavior normal.      ED Treatments / Results  Labs (all labs ordered are listed, but only abnormal results are displayed) Labs Reviewed  BASIC METABOLIC PANEL - Abnormal; Notable for the following components:      Result Value   Glucose, Bld 111 (*)    BUN 7 (*)    All other components within normal limits  D-DIMER, QUANTITATIVE (NOT AT [facility]) - Abnormal; Notable for the following components:   D-Dimer, Quant 1.11 (*)    All other components within normal limits  CBC WITH DIFFERENTIAL/PLATELET  TROPONIN I (HIGH SENSITIVITY)  TROPONIN I (HIGH SENSITIVITY)    EKG EKG Interpretation  Date/Time:  Monday January 16 2019 08:26:19 EDT Ventricular Rate:  90 PR Interval:    QRS Duration: 84 QT Interval:  340 QTC Calculation: 416 R Axis:   -6 Text Interpretation:  Sinus rhythm No significant change since last tracing Confirmed by Deno Etienne [phone]) on 01/16/2019 8:32:29 AM   Radiology Nm Pulmonary Perfusion  Result Date: 01/16/2019 CLINICAL DATA:  Sudden onset chest pain at 0330 hours today EXAM: NUCLEAR MEDICINE PERFUSION LUNG SCAN TECHNIQUE: Perfusion images were obtained in multiple projections after intravenous injection of radiopharmaceutical. Ventilation scans intentionally deferred if perfusion scan and chest x-ray adequate for interpretation during COVID 19 epidemic. RADIOPHARMACEUTICALS:  1.54 mCi Tc-87m MAA IV COMPARISON:  Chest radiograph 01/16/2019 FINDINGS: Normal perfusion RIGHT lung. Absent perfusion LEFT lung consistent with history of lung cancer and pneumonectomy. No perfusion defects identified. Chest radiograph demonstrates postsurgical changes of LEFT pneumonectomy with a clear RIGHT lung. IMPRESSION: Normal perfusion lung scan RIGHT lung. Prior LEFT pneumonectomy. Electronically Signed   By: Lavonia Dana M.D.   On:  01/16/2019 15:24   Dg Chest  Portable 1 View  Result Date: 01/16/2019 CLINICAL DATA:  Chest pain.  History of left pneumonectomy. EXAM: PORTABLE CHEST 1 VIEW COMPARISON:  Chest x-ray dated June 17, 2018. FINDINGS: The cardiomediastinal silhouette remains obscured with leftward mediastinal shift due to prior left pneumonectomy. Unchanged small air-fluid level in the apex of the left hemithorax. The right lung is clear. Normal pulmonary vascularity. No acute osseous abnormality. IMPRESSION: 1. No active disease. 2. Prior left pneumonectomy. Electronically Signed   By: Titus Dubin M.D.   On: 01/16/2019 09:20    Procedures Procedures (including critical care time)  Medications Ordered in ED Medications  aspirin chewable tablet 324 mg (324 mg Oral Not Given 01/16/19 0918)  technetium albumin aggregated (MAA) injection solution 4.88 millicurie (8.91 millicuries Intravenous Contrast Given 01/16/19 1405)     Initial Impression / Assessment and Plan / ED Course  I have reviewed the triage vital signs and the nursing notes.  Pertinent labs & imaging results that were available during my care of the patient were reviewed by me and considered in my medical decision making (see chart for details).       HEAR score of 4  Mr. Sangalang is a 78 year old male who presents with an approximately 6-hour history of sharp left-sided chest pain.  EKG without signs of acute ischemia and pain is not typical for ACS but given early morning onset of symptoms and diaphoresis will rule out ACS with serial troponins. Serial troponins are flat at 8 and 8. Given that pain is worse with deep breathing and patient has a history of cancer will rule out PE with D-dimer. D-dimer was elevated to 1.11. CTA unable to be ordered due to history of contrast allergy. VQ scan performed which was negative for PE. CBC and BMP without significant abnormality. Patient discharged with PCP followup.     Final Clinical Impressions(s)  / ED Diagnoses   Final diagnoses:  None    ED Discharge Orders    None       Jeanmarie Hubert, MD 01/16/19 [place], [place], DO 01/18/19 1505

## 2019-01-16 NOTE — ED Notes (Signed)
Patient verbalizes understanding of discharge instructions. Opportunity for questioning and answers were provided. Armband removed by staff, pt discharged from ED.  

## 2019-01-16 NOTE — Discharge Instructions (Addendum)
Today you were seen for chest pain. We did studies to rule out a heart attack and a pulmonary embolism. These studies were normal. It is important that you followup with your primary care provider so they can further workup the symptoms you are having.  Please return to the emergency room for severe chest pain, difficulty breathing, or any other concerning symptom.  Thank you for allowing [place] to be part of your medical care

## 2019-01-16 NOTE — ED Triage Notes (Signed)
Pt brought in by [facility] from home for sudden onset of CP around 0330 this am. Pt has significant cardiac hx, pt took 324mg  aspirin PTA. Pt also took x2 of his own nitro with minima relief. Pt given x1 nitro with EMS with no relief. Pt skin warm and dry, denies NVD, SOB. Pt A+Ox4 and in NAD on arrival. Per EMS EKG unremarkable.

## 2019-01-16 NOTE — ED Notes (Signed)
Per lab able to add on D-Dimer to previous lab work

## 2019-01-23 DIAGNOSIS — R258 Other abnormal involuntary movements: Secondary | ICD-10-CM | POA: Diagnosis not present

## 2019-01-23 DIAGNOSIS — I25119 Atherosclerotic heart disease of native coronary artery with unspecified angina pectoris: Secondary | ICD-10-CM | POA: Diagnosis not present

## 2019-01-23 DIAGNOSIS — R29898 Other symptoms and signs involving the musculoskeletal system: Secondary | ICD-10-CM | POA: Diagnosis not present

## 2019-01-27 NOTE — Progress Notes (Signed)
Cardiology Office Note   Date:  02/02/2019   ID:  Zachary Bean, DOB 09/21/1940, MRN [id number]  PCP:  Jonathon , MD  Cardiologist:    Martinique, MD   Chief Complaint  Patient presents with  . New Patient (Initial Visit)    Referred by Dr. Stephanie Acre for a cardiology evaluation. Patient c/o chest pain.  Meds reviewed verbally with patient.       History of Present Illness: Zachary Bean is a 78 y.o. male is seen at the request of Dr Stephanie Acre for evaluation of chest pain and dyspnea. He has a remote history of lung CA s/p left pneumonectomy. He has a history of paroxysmal Afib and idiopathic pericardial effusion in 2008. He underwent a pericardial window at [facility]. I saw him in 2013- he had a normal Myoview and Echo at that time. He was evaluated by Dr Ellyn Hack in 2017 with symptoms of chest pain. Myoview showed normal perfusion with EF 49%. Echo was normal. Because of ongoing chest pain Cardiac catheterization was obtained showing nonobstructive disease. CT in June 2017 showed no PE but findings c/w RLL PNA.   Recently he presented to the ED for evaluation of chest pain. He states he awoke from sleep with bad left parasternal chest pain radiating to left neck and shoulder. No relief with sl Ntg. In ED Ecg showed no acute changes. HS troponins were negative. D dimer was mildly elevated. Subsequent  V/Q scan on 01/16/19 was normal.   He has not had any recurrent chest pain. His major complaint is of increased DOE. Notes this more when walking up an incline. A year ago he went dancing 3x/week but doesn't have the breath to do so now. No edema. Notes energy level is less. Does not use his  Symbicort inhaler regularly.     Past Medical History:  Diagnosis Date  . Acute idiopathic pericarditis 2008   s/p fluoroscopic pericardial window at [facility]  . Childhood asthma   . Chronic kidney disease    STAGE 2 CKD FOLLOWED BY PRIMARY DR Wynelle Link  . Chronic lower back pain   . Chronic  sinusitis   . Duodenal ulcer, chronic   . GERD (gastroesophageal reflux disease)   . Headache    "at least once/week; sometimes more" (07/09/2015)  . History of kidney stones 1960'S  . Hyperlipidemia   . Hypothyroidism   . Left trigger finger   . Lung cancer ([place]) 1995   s/p left pneumonectomy in 1995; also treated with radiation  . Normal echocardiogram 08/2011  . Normal nuclear stress test 08/2011   EF was slightly down but normal by echo; no ischemia or scar noted.   Marland Kitchen NSVT (nonsustained ventricular tachycardia) ([place])   . PAF (paroxysmal atrial fibrillation) ([place]) 10 YRS AGO NOT CURRENT PROBLEM   s/p ablation at [facility]  . Type II diabetes mellitus ([place])     Past Surgical History:  Procedure Laterality Date  . ATRIAL FIBRILLATION ABLATION  early 2000s   "@ [facility]"  . BACK SURGERY    . CARDIAC CATHETERIZATION N/A 07/09/2015   Procedure: Left Heart Cath and Coronary Angiography;  Surgeon: Belva Crome, MD;  Location: [place] CV [facility];  Service: Cardiovascular;  Laterality: N/A;  . CATARACT EXTRACTION W/ INTRAOCULAR LENS  IMPLANT, BILATERAL Bilateral   . INGUINAL HERNIA REPAIR Left 2000  . INGUINAL HERNIA REPAIR Right 11/11/2017   Procedure: RIGHT INGUINAL HERNIA REPAIR WITH MESH;  Surgeon: Coralie Keens, MD;  [facility];  Service: General;  Laterality: Right;  . INSERTION OF MESH Right 11/11/2017   Procedure: INSERTION OF MESH;  Surgeon: Coralie Keens, MD;  Location: [facility];  Service: General;  Laterality: Right;  . LEFT LUNG REMOVED      DR Arlyce Dice LUNG CANCER  . LUMBAR MICRODISCECTOMY  1984   "where sciatic nerve goes thru"  . PNEUMONECTOMY Left 1995   lung cancer  . SKIN CANCER AREAS REMOVED FROM EAR AND CHEST       Current Outpatient Medications  Medication Sig Dispense Refill  . acetaminophen (TYLENOL) 500 MG tablet Take 500 mg by mouth every 6 (six) hours as needed for moderate pain or headache.    . budesonide-formoterol (SYMBICORT) 160-4.5 MCG/ACT inhaler  Inhale 2 puffs into the lungs 2 (two) times daily as needed (for shortness of breath).    . cholecalciferol (VITAMIN D3) 25 MCG (1000 UT) tablet Take 1,000 Units by mouth daily.    . Cyanocobalamin (VITAMIN B 12 PO) Take 1 tablet by mouth daily.    Marland Kitchen esomeprazole (NEXIUM) 40 MG capsule Take 40 mg by mouth daily at 12 noon.    Marland Kitchen glimepiride (AMARYL) 2 MG tablet Take 2 mg by mouth daily.    Marland Kitchen levothyroxine (SYNTHROID) 100 MCG tablet Take 100 mcg by mouth daily before breakfast.    . losartan (COZAAR) 50 MG tablet Take 25 mg by mouth daily.     . metoprolol (LOPRESSOR) 50 MG tablet Take 50 mg by mouth daily.    . ONE TOUCH ULTRA TEST test strip     . OVER THE COUNTER MEDICATION Place 2 drops into both ears daily as needed (for ear aches). Hylands Ear Ache Drops    . traMADol (ULTRAM) 50 MG tablet Take 1 tablet (50 mg total) by mouth every 6 (six) hours as needed for moderate pain or severe pain. 20 tablet 0  . levothyroxine (SYNTHROID, LEVOTHROID) 125 MCG tablet Take 125 mcg by mouth daily.    . nitroGLYCERIN (NITROSTAT) 0.4 MG SL tablet Place 1 tablet (0.4 mg total) under the tongue every 5 (five) minutes as needed for chest pain. 25 tablet 2   No current facility-administered medications for this visit.     Allergies:   Iodinated diagnostic agents, Doxycycline, and Empagliflozin    Social History:  The patient  reports that he quit smoking about 25 years ago. His smoking use included cigarettes. He has a 40.00 pack-year smoking history. He has quit using smokeless tobacco.  His smokeless tobacco use included chew. He reports current alcohol use. He reports that he does not use drugs.   Family History:  The patient's family history includes Cancer in an other family member; Coronary artery disease in an other family member; Diabetes in an other family member; Emphysema in an other family member; Heart failure in his father and another family member; Hypertension in an other family member; Stroke  in his mother and another family member.    ROS:  Please see the history of present illness.   Otherwise, review of systems are positive for none.   All other systems are reviewed and negative.    PHYSICAL EXAM: VS:  BP 134/68 (BP Location: Right Arm, Patient Position: Sitting, Cuff Size: Normal)   Pulse 75   Ht 5' 9.5" (1.765 m)   Wt 180 lb (81.6 kg)   BMI 26.20 kg/m  , BMI Body mass index is 26.2 kg/m. GEN: Well nourished, well developed, in no acute distress  HEENT: normal  Neck: no JVD, carotid bruits, or masses Cardiac: RRR; no murmurs, rubs, or gallops,no edema  Respiratory:  clear to auscultation on right, diminished BS on left, normal work of breathing GI: soft, nontender, nondistended, + BS MS: no deformity or atrophy  Skin: warm and dry, no rash Neuro:  Strength and sensation are intact Psych: euthymic mood, full affect   EKG:  EKG is ordered today. The ekg ordered today demonstrates NSR with LVH. No acute change. I have personally reviewed and interpreted this study.    Recent Labs: 01/16/2019: BUN 7; Creatinine, Ser 0.86; Hemoglobin 13.8; Platelets 258; Potassium 3.9; Sodium 140    Lipid Panel No results found for: CHOL, TRIG, HDL, CHOLHDL, VLDL, LDLCALC, LDLDIRECT    Wt Readings from Last 3 Encounters:  02/02/19 180 lb (81.6 kg)  06/17/18 180 lb 12.8 oz (82 kg)  11/11/17 174 lb (78.9 kg)      Other studies Reviewed: Additional studies/ records that were reviewed today include:  Labs dated 01/12/19: Cholesterol 201, triglycerides 214, HDL 47, LDL 111. A1c 7%. Creatinine 1.19. Otherwise CBC and CMET normal   ASSESSMENT AND PLAN:  1.  Chest pain. Patient has had extensive ischemic evaluation in the past with no significant CAD. Doubt this episode was cardiac given non acute Ecg and normal HS troponin. Fortunately pain has not recurred. No further evaluation planned. 2. DOE. I suspect this is more long term deterioration due to prior pneumonectomy and/or  COPD. I would like to start with full PFTs to assess. May consider pulmonary evaluation.  3. Remote history of pericarditis s/p window. No recurrence. 4. History of AFib related to #3 no recurrence.  5. Lung CA s/p remote pneumonectomy 6. HTN    Current medicines are reviewed at length with the patient today.  The patient does not have concerns regarding medicines.  The following changes have been made:  no change  Labs/ tests ordered today include:   Orders Placed This Encounter  Procedures  . EKG 12-Lead  . Pulmonary function test     Disposition:   FU TBD   Signed,  Martinique, MD  02/02/2019 4:28 PM    [facility] [address], [place], [place], [address] Phone [phone], Fax [phone]

## 2019-02-02 ENCOUNTER — Encounter: Payer: Self-pay | Admitting: Cardiology

## 2019-02-02 ENCOUNTER — Ambulatory Visit (INDEPENDENT_AMBULATORY_CARE_PROVIDER_SITE_OTHER): Payer: PPO | Admitting: Cardiology

## 2019-02-02 ENCOUNTER — Other Ambulatory Visit: Payer: Self-pay

## 2019-02-02 VITALS — BP 134/68 | HR 75 | Ht 69.5 in | Wt 180.0 lb

## 2019-02-02 DIAGNOSIS — R0609 Other forms of dyspnea: Secondary | ICD-10-CM | POA: Diagnosis not present

## 2019-02-02 DIAGNOSIS — R072 Precordial pain: Secondary | ICD-10-CM | POA: Diagnosis not present

## 2019-02-02 DIAGNOSIS — E7800 Pure hypercholesterolemia, unspecified: Secondary | ICD-10-CM

## 2019-02-02 NOTE — Patient Instructions (Signed)
Medication Instructions:  Continue same medications    Lab work: None ordered   Testing/Procedures: Schedule Full PFT's  Follow-Up: At [facility], you and your health needs are our priority.  As part of our continuing mission to provide you with exceptional heart care, we have created designated Provider Care Teams.  These Care Teams include your primary Cardiologist (physician) and Advanced Practice Providers (APPs -  Physician Assistants and Nurse Practitioners) who all work together to provide you with the care you need, when you need it. . Follow up appointment will be determined after test

## 2019-02-06 DIAGNOSIS — Z85828 Personal history of other malignant neoplasm of skin: Secondary | ICD-10-CM | POA: Diagnosis not present

## 2019-02-06 DIAGNOSIS — C44612 Basal cell carcinoma of skin of right upper limb, including shoulder: Secondary | ICD-10-CM | POA: Diagnosis not present

## 2019-02-06 DIAGNOSIS — D1801 Hemangioma of skin and subcutaneous tissue: Secondary | ICD-10-CM | POA: Diagnosis not present

## 2019-02-06 DIAGNOSIS — L821 Other seborrheic keratosis: Secondary | ICD-10-CM | POA: Diagnosis not present

## 2019-02-06 DIAGNOSIS — D225 Melanocytic nevi of trunk: Secondary | ICD-10-CM | POA: Diagnosis not present

## 2019-02-06 DIAGNOSIS — D485 Neoplasm of uncertain behavior of skin: Secondary | ICD-10-CM | POA: Diagnosis not present

## 2019-02-06 DIAGNOSIS — L570 Actinic keratosis: Secondary | ICD-10-CM | POA: Diagnosis not present

## 2019-02-06 NOTE — Progress Notes (Signed)
Zachary Bean was seen today in the movement disorders clinic for neurologic consultation at the request of Jonathon Jordan, MD.  The consultation is for the evaluation of tremor and weakness of the LUE/LLE.  Associates the L leg weakness with back issues.  The records that were made available to me were reviewed.  Tremor: Yes.     How long has it been going on? 6 + months  At rest or with activation?  Rest and activation (note it when playing guitar - had lymph node dissection and wonders if related).  Does state that is better since thyroid dose adjusted  Fam hx of tremor?  No.  Located where?  L hand  Affected by caffeine:  Doesn't drink any  Affected by alcohol:  Doesn't drink any  Affected by stress:  no  Affected by fatigue:  No., but admits to fatigue and "I don't have no energy"  Spills soup if on spoon:  No. because he is R handed and tremor on the L hand  Tremor inducing meds:  Yes.   symbicort but admits does not use it daily ("it makes my throat sore")  Other Specific Symptoms:  Voice: weaker Sleep: "I've never slept well" - has leg cramps at night but not in the day  Vivid Dreams:  No.  Acting out dreams:  No. Wet Pillows: No. Postural symptoms:  Yes.   - "when my thyroid was messed up, I was all over the place but it is better now"  Falls?  No. Bradykinesia symptoms: slow movements and difficulty getting out of a chair; also thinks that slower because he usually does dancing and has not been able to do that because of covid and because of breathing trouble Loss of smell:  No. Loss of taste:  No. Urinary Incontinence:  No. Difficulty Swallowing:  Yes.  , has had esophagus stretched through the years Handwriting, micrographia: No. Trouble with ADL's:  No.  Trouble buttoning clothing: No. Depression:  No. Memory changes:  No. Hallucinations:  No.  visual distortions: No. N/V:  No. Lightheaded:  Yes.  , when "my thyroid was off but better now"  Syncope: No.  Diplopia:  No. Dyskinesia:  No.  Neuroimaging of the brain has not previously been performed in the recent years.  They are not available for my review.    ALLERGIES:   Allergies  Allergen Reactions  . Iodinated Diagnostic Agents Hives, Itching and Other (See Comments)    Other Reaction: stinging  . Doxycycline Nausea And Vomiting  . Empagliflozin     DIZZINESS, HEACAHES  Other reaction(s): headache    CURRENT MEDICATIONS:  Current Outpatient Medications  Medication Instructions  . acetaminophen (TYLENOL) 500 mg, Oral, Every 6 hours PRN  . baclofen (LIORESAL) 10 mg, Oral, 3 times daily, PRN   . budesonide-formoterol (SYMBICORT) 160-4.5 MCG/ACT inhaler 2 puffs, Inhalation, 2 times daily PRN  . cholecalciferol (VITAMIN D3) 1,000 Units, Oral, Daily  . Cyanocobalamin (VITAMIN B 12 PO) 1 tablet, Oral, Daily  . esomeprazole (NEXIUM) 40 mg, Oral, Daily  . glimepiride (AMARYL) 2 mg, Oral, Daily  . levothyroxine (SYNTHROID) 125 mcg, Oral, Daily  . levothyroxine (SYNTHROID) 100 mcg, Oral, Daily before breakfast  . losartan (COZAAR) 25 mg, Daily  . lubiprostone (AMITIZA) 8 mcg, Oral, 2 times daily with meals, PRN   . metoprolol tartrate (LOPRESSOR) 50 mg, Daily  . nitroGLYCERIN (NITROSTAT) 0.4 mg, Sublingual, Every 5 min PRN  . ONE TOUCH ULTRA TEST test strip No dose, route,  or frequency recorded.  Marland Kitchen OVER THE COUNTER MEDICATION 2 drops, Both EARS, Daily PRN, Hylands Ear Ache Drops  . traMADol (ULTRAM) 50 mg, Oral, Every 6 hours PRN    PAST MEDICAL HISTORY:   Past Medical History:  Diagnosis Date  . Acute idiopathic pericarditis 2008   s/p fluoroscopic pericardial window at [facility]  . Childhood asthma   . Chronic kidney disease    STAGE 2 CKD FOLLOWED BY PRIMARY DR Wynelle Link  . Chronic lower back pain   . Chronic sinusitis   . Duodenal ulcer, chronic   . GERD (gastroesophageal reflux disease)   . Headache    "at least once/week; sometimes more" (07/09/2015)  . History of  kidney stones 1960'S  . Hyperlipidemia   . Hypothyroidism   . Left trigger finger   . Lung cancer ([place]) 1995   s/p left pneumonectomy in 1995; also treated with radiation  . Normal echocardiogram 08/2011  . Normal nuclear stress test 08/2011   EF was slightly down but normal by echo; no ischemia or scar noted.   Marland Kitchen NSVT (nonsustained ventricular tachycardia) ([place])   . PAF (paroxysmal atrial fibrillation) ([place]) 10 YRS AGO NOT CURRENT PROBLEM   s/p ablation at [facility]  . Type II diabetes mellitus ([place])     PAST SURGICAL HISTORY:   Past Surgical History:  Procedure Laterality Date  . ATRIAL FIBRILLATION ABLATION  early 2000s   "@ [facility]"  . BACK SURGERY    . CARDIAC CATHETERIZATION N/A 07/09/2015   Procedure: Left Heart Cath and Coronary Angiography;  Surgeon: Belva Crome, MD;  Location: [place] CV LAB;  Service: Cardiovascular;  Laterality: N/A;  . CATARACT EXTRACTION W/ INTRAOCULAR LENS  IMPLANT, BILATERAL Bilateral   . INGUINAL HERNIA REPAIR Left 2000  . INGUINAL HERNIA REPAIR Right 11/11/2017   Procedure: RIGHT INGUINAL HERNIA REPAIR WITH MESH;  Surgeon: Coralie Keens, MD;  [facility];  Service: General;  Laterality: Right;  . INSERTION OF MESH Right 11/11/2017   Procedure: INSERTION OF MESH;  Surgeon: Coralie Keens, MD;  Location: [facility];  Service: General;  Laterality: Right;  . LEFT LUNG REMOVED      DR Arlyce Dice LUNG CANCER  . LUMBAR MICRODISCECTOMY  1984   "where sciatic nerve goes thru"  . PNEUMONECTOMY Left 1995   lung cancer  . SKIN CANCER AREAS REMOVED FROM EAR AND CHEST      SOCIAL HISTORY:   Social History   Socioeconomic History  . Marital status: Married    Spouse name: Not on file  . Number of children: 2  . Years of education: Not on file  . Highest education level: 9th grade  Occupational History  . Occupation: retired  Scientific laboratory technician  . Financial resource strain: Not on file  . Food insecurity    Worry: Not on file    Inability: Not on file   . Transportation needs    Medical: Not on file    Non-medical: Not on file  Tobacco Use  . Smoking status: Former Smoker    Packs/day: 1.00    Years: 40.00    Pack years: 40.00    Types: Cigarettes    Quit date: 07/23/1993    Years since quitting: 25.5  . Smokeless tobacco: Former Systems developer    Types: Chew  . Tobacco comment: "chewed when I was real young"  Substance and Sexual Activity  . Alcohol use: Yes    Comment: "drank a little alcohol; quit when I was 78  years old"  . Drug use: No  . Sexual activity: Yes  Lifestyle  . Physical activity    Days per week: Not on file    Minutes per session: Not on file  . Stress: Not on file  Relationships  . Social Herbalist on phone: Not on file    Gets together: Not on file    Attends religious service: Not on file    Active member of club or organization: Not on file    Attends meetings of clubs or organizations: Not on file    Relationship status: Not on file  . Intimate partner violence    Fear of current or ex partner: Not on file    Emotionally abused: Not on file    Physically abused: Not on file    Forced sexual activity: Not on file  Other Topics Concern  . Not on file  Social History Narrative  . Not on file    FAMILY HISTORY:   Family Status  Relation Name Status  . Mother  Deceased at age 26  . Father  Deceased at age 64  . Other  (Not Specified)  . Other  (Not Specified)  . Other  (Not Specified)  . Other  (Not Specified)  . Other  (Not Specified)  . Other  (Not Specified)  . Other  (Not Specified)  . Sister  Deceased       mumps, measles  . Brother x7 Deceased  . Daughter  Alive  . Son  Alive    ROS:  Review of Systems  Constitutional: Positive for malaise/fatigue.  HENT: Negative.   Eyes: Negative.   Respiratory: Positive for shortness of breath.   Cardiovascular: Negative.   Gastrointestinal: Positive for constipation.  Genitourinary: Negative.   Musculoskeletal: Positive for back pain  (radiates to the L leg).  Skin: Negative.   Neurological: Positive for tingling (feet) and headaches (chronic - "I have always had headaches" - bilateral occiput that radiates forward - will get scintillating scotomas - cannot state frequency).  Endo/Heme/Allergies: Negative.     PHYSICAL EXAMINATION:    VITALS:   Vitals:   02/10/19 0912  BP: 129/73  Pulse: 75  SpO2: 98%  Weight: 177 lb 9.6 oz (80.6 kg)  Height: 5\' 11"  (1.803 m)    GEN:  The patient appears stated age and is in NAD. HEENT:  Normocephalic, atraumatic.  The mucous membranes are moist. The superficial temporal arteries are without ropiness or tenderness. CV:  RRR Lungs:  CTAB Neck/HEME:  There are no carotid bruits bilaterally.  Neurological examination:  Orientation: The patient is alert and oriented x3. Fund of knowledge is appropriate.  Recent and remote memory are intact.  Attention and concentration are normal.    Able to name objects and repeat phrases. Cranial nerves: There is good facial symmetry. Pupils are equal round and reactive to light bilaterally. Fundoscopic exam reveals clear margins bilaterally. Extraocular muscles are intact. The visual fields are full to confrontational testing. The speech is fluent and clear. Soft palate rises symmetrically and there is no tongue deviation. Hearing is intact to conversational tone. Sensation: Sensation is intact to light and pinprick throughout (facial, trunk, extremities). Vibration is decreased distally. There is no extinction with double simultaneous stimulation. There is no sensory dermatomal level identified. Motor: Strength is 5/5 in the bilateral upper and lower extremities.   Shoulder shrug is equal and symmetric.  There is no pronator drift. Deep tendon reflexes: Deep tendon  reflexes are 1/4 at the bilateral biceps, triceps, brachioradialis, absent at the bilateral patella and achilles. Plantar responses are downgoing bilaterally.  Movement examination:  Tone: There is normal tone in the upper and lower extremities. Abnormal movements: There is no rest tremor.  There was no postural tremor.  There is no intention tremor.  He had no trouble with Archimedes spirals. Coordination:  There is no decremation with RAM's, with any form of RAMS, including alternating supination and pronation of the forearm, hand opening and closing, finger taps, heel taps and toe taps. Gait and Station: The patient has no difficulty arising out of a deep-seated chair without the use of the hands. The patient's stride length is good with good arm swing.  He does have a slightly wide-based gait.      Chemistry      Component Value Date/Time   NA 140 01/16/2019 0842   K 3.9 01/16/2019 0842   CL 107 01/16/2019 0842   CO2 23 01/16/2019 0842   BUN 7 (L) 01/16/2019 0842   CREATININE 0.86 01/16/2019 0842      Component Value Date/Time   CALCIUM 9.0 01/16/2019 0842   ALKPHOS 61 10/04/2014 1023   AST 59 (H) 10/04/2014 1023   ALT 48 10/04/2014 1023   BILITOT 1.6 (H) 10/04/2014 1023     Patient's most recent hemoglobin A1c was on January 12, 2019 and was 7.0.  His TSH was low at 0.29 and his dose of Synthroid was decreased at the time (better after medication adjusted)  ASSESSMENT/PLAN:  1.  Tremor, by history  -Did not see any tremor on examination today, and the patient admits that this was resolved once his overtreated hypothyroidism was corrected.  Suspect that this was all related to thyroid.  Patient states that his balance and his tremor all got better once his Synthroid dose was adjusted.  I did not see any evidence of a neurodegenerative process like Parkinson's disease.  Reassurance was provided.  I did not see any essential tremor either. 2.  Diabetic neuropathy  -Patient did had evidence of diabetic neuropathy today on examination, which certainly could cause gait instability.  He states that he was very aware of this.  He does have paresthesias.  Reports that  this has been very longstanding.  He and I talked about safety.  He wants no further medication, which I agree with.  His balance actually got much better when his Synthroid was adjusted, and balance actually looks fairly good today. 3.  Follow-up on an as-needed basis.  Much greater than 50% of this visit was spent in counseling and coordinating care.  Total face to face time:  45 min  Cc:  Jonathon Jordan, MD

## 2019-02-10 ENCOUNTER — Other Ambulatory Visit: Payer: PPO

## 2019-02-10 ENCOUNTER — Encounter: Payer: Self-pay | Admitting: Neurology

## 2019-02-10 ENCOUNTER — Other Ambulatory Visit: Payer: Self-pay

## 2019-02-10 ENCOUNTER — Ambulatory Visit: Payer: PPO | Admitting: Neurology

## 2019-02-10 ENCOUNTER — Other Ambulatory Visit (HOSPITAL_COMMUNITY)
Admission: RE | Admit: 2019-02-10 | Discharge: 2019-02-10 | Disposition: A | Discharge status: Home / Self Care | Payer: PPO | Source: Ambulatory Visit | Attending: Cardiology | Admitting: Cardiology

## 2019-02-10 VITALS — BP 129/73 | HR 75 | Ht 71.0 in | Wt 177.6 lb

## 2019-02-10 DIAGNOSIS — E1142 Type 2 diabetes mellitus with diabetic polyneuropathy: Secondary | ICD-10-CM | POA: Diagnosis not present

## 2019-02-10 DIAGNOSIS — Z20828 Contact with and (suspected) exposure to other viral communicable diseases: Secondary | ICD-10-CM | POA: Insufficient documentation

## 2019-02-10 DIAGNOSIS — Z01812 Encounter for preprocedural laboratory examination: Secondary | ICD-10-CM | POA: Insufficient documentation

## 2019-02-10 DIAGNOSIS — R251 Tremor, unspecified: Secondary | ICD-10-CM | POA: Diagnosis not present

## 2019-02-10 DIAGNOSIS — E039 Hypothyroidism, unspecified: Secondary | ICD-10-CM

## 2019-02-11 LAB — NOVEL CORONAVIRUS, NAA (HOSP ORDER, SEND-OUT TO REF LAB; TAT 18-24 HRS): SARS-CoV-2, NAA: NOT DETECTED

## 2019-02-14 ENCOUNTER — Other Ambulatory Visit: Payer: Self-pay

## 2019-02-14 ENCOUNTER — Ambulatory Visit (HOSPITAL_COMMUNITY)
Admission: RE | Admit: 2019-02-14 | Discharge: 2019-02-14 | Disposition: A | Discharge status: Home / Self Care | Payer: PPO | Source: Ambulatory Visit | Attending: Cardiology | Admitting: Cardiology

## 2019-02-14 DIAGNOSIS — R0609 Other forms of dyspnea: Secondary | ICD-10-CM | POA: Insufficient documentation

## 2019-02-14 DIAGNOSIS — R072 Precordial pain: Secondary | ICD-10-CM | POA: Diagnosis not present

## 2019-02-14 DIAGNOSIS — E7800 Pure hypercholesterolemia, unspecified: Secondary | ICD-10-CM | POA: Insufficient documentation

## 2019-02-14 LAB — PULMONARY FUNCTION TEST
DL/VA % pred: 135 %
DL/VA: 5.34 ml/min/mmHg/L
DLCO unc % pred: 66 %
DLCO unc: 16.33 ml/min/mmHg
FEF 25-75 Post: 1.09 L/sec
FEF 25-75 Pre: 0.64 L/sec
FEF2575-%Change-Post: 70 %
FEF2575-%Pred-Post: 51 %
FEF2575-%Pred-Pre: 30 %
FEV1-%Change-Post: 14 %
FEV1-%Pred-Post: 43 %
FEV1-%Pred-Pre: 38 %
FEV1-Post: 1.29 L
FEV1-Pre: 1.13 L
FEV1FVC-%Change-Post: 0 %
FEV1FVC-%Pred-Pre: 93 %
FEV6-%Change-Post: 12 %
FEV6-%Pred-Post: 48 %
FEV6-%Pred-Pre: 43 %
FEV6-Post: 1.88 L
FEV6-Pre: 1.66 L
FEV6FVC-%Change-Post: -1 %
FEV6FVC-%Pred-Post: 105 %
FEV6FVC-%Pred-Pre: 106 %
FVC-%Change-Post: 14 %
FVC-%Pred-Post: 46 %
FVC-%Pred-Pre: 40 %
FVC-Post: 1.9 L
FVC-Pre: 1.67 L
Post FEV1/FVC ratio: 68 %
Post FEV6/FVC ratio: 99 %
Pre FEV1/FVC ratio: 68 %
Pre FEV6/FVC Ratio: 100 %
RV % pred: 115 %
RV: 2.95 L
TLC % pred: 67 %
TLC: 4.66 L

## 2019-02-14 MED ORDER — ALBUTEROL SULFATE (2.5 MG/3ML) 0.083% IN NEBU
2.5000 mg | INHALATION_SOLUTION | Freq: Once | RESPIRATORY_TRACT | Status: AC
  Administered 2019-02-14: 10:00:00 2.5 mg via RESPIRATORY_TRACT

## 2019-02-15 ENCOUNTER — Other Ambulatory Visit: Payer: Self-pay

## 2019-02-15 DIAGNOSIS — R942 Abnormal results of pulmonary function studies: Secondary | ICD-10-CM

## 2019-02-15 DIAGNOSIS — C44519 Basal cell carcinoma of skin of other part of trunk: Secondary | ICD-10-CM | POA: Diagnosis not present

## 2019-02-15 DIAGNOSIS — R0609 Other forms of dyspnea: Secondary | ICD-10-CM

## 2019-02-15 NOTE — Progress Notes (Signed)
Amb ref

## 2019-02-21 ENCOUNTER — Telehealth: Payer: Self-pay | Admitting: Internal Medicine

## 2019-02-21 ENCOUNTER — Other Ambulatory Visit: Payer: Self-pay

## 2019-02-21 ENCOUNTER — Ambulatory Visit: Payer: PPO | Admitting: Internal Medicine

## 2019-02-21 ENCOUNTER — Encounter: Payer: Self-pay | Admitting: Internal Medicine

## 2019-02-21 VITALS — BP 126/64 | HR 76 | Ht 70.0 in | Wt 180.6 lb

## 2019-02-21 DIAGNOSIS — R0609 Other forms of dyspnea: Secondary | ICD-10-CM

## 2019-02-21 DIAGNOSIS — Q211 Atrial septal defect: Secondary | ICD-10-CM | POA: Diagnosis not present

## 2019-02-21 DIAGNOSIS — Q2112 Patent foramen ovale: Secondary | ICD-10-CM

## 2019-02-21 NOTE — Progress Notes (Signed)
OV 02/21/2019  Subjective:  Patient ID: Zachary Bean, male , DOB: [birth year] , age 78 y.o. , MRN: [id number] , ADDRESS: [address] [place] [facility] [address]   02/21/2019 -   Chief Complaint  Patient presents with  . Consult    referred by cardiologist for DOE, abnormal PFT (in Epic).  hx lung ca    History is obtained from him and review of the chart. HPI Zachary Bean 78 y.o. -status post left pneumonectomy for remote history of lung cancer approximately over 20 years ago.  He did have a pericardial window in 2008.  He tells me that ever since his pneumonectomy he has had orthopnea and also dyspnea with the right lateral decubitus position.  Therefore he sleeps on his left side which is the site of the pneumonectomy.  However in the last few to several months has had worsening dyspnea whereby he is now contemplating having to get multiple pillows to sleep.  He is also noticing increased dyspnea on exertion relieved by rest.  No proximal nocturnal dyspnea.  No dyspnea while standing.  No edema.  No chest pain.  No wheezing.  Cardiology has referred him here.  Last CT chest was in 2017.  Walking desaturation test 185 feet x 3 laps on room air: He did not desaturate.  I checked his pulse ox supine and also right lateral decubitus position and left lateral decubitus position and standing.  And none of this he desaturated.  His pulse ox stayed between 97 and 98% and heart rate was flat at 78-79/min.  He did get dyspneic.  Tests  CT angiogram chest June 2017: Personally visualized shows left-sided pneumonectomy with with fluid filling rotation of the heart to the left.  Clear lung fields on the right..  No report of pulmonary embolism per report.  Echocardiogram February 2017: Shows elevated right ventricular systolic pressure to 31 mmHg but normal left ventricular ejection fraction   Cardiac cath February 2017: On my personal review it appears that he might have nonobstructive  coronary artery disease.  VQ scan January 16, 2019: Shows prior left pneumonectomy and normal perfusion in the right lung  Pulmonary function test September 2020:- Described below.  Shows restriction with low DLCO and can be consistent with his left pneumonectomy.  Results for [name], [name] (MRN [id number]) as of 02/21/2019 09:06  Ref. Range 02/14/2019 09:09  FVC-Pre Latest Units: L 1.67  FVC-%Pred-Pre Latest Units: % 40    Results for [name], [name] (MRN [id number]) as of 02/21/2019 09:06  Ref. Range 02/14/2019 09:09  TLC Latest Units: L 4.66  TLC % pred Latest Units: % 67   Results for GEN, [name] (MRN [id number]) as of 02/21/2019 09:06  Ref. Range 02/14/2019 09:09  DLCO unc Latest Units: ml/min/mmHg 16.33  DLCO unc % pred Latest Units: % 66      ROS - per HPI     has a past medical history of Acute idiopathic pericarditis (2008), Childhood asthma, Chronic kidney disease, Chronic lower back pain, Chronic sinusitis, Duodenal ulcer, chronic, GERD (gastroesophageal reflux disease), Headache, History of kidney stones (1960'S), Hyperlipidemia, Hypothyroidism, Left trigger finger, Lung cancer ([place]) (1995), Normal echocardiogram (08/2011), Normal nuclear stress test (08/2011), NSVT (nonsustained ventricular tachycardia) ([place]), PAF (paroxysmal atrial fibrillation) ([place]) (10 YRS AGO NOT CURRENT PROBLEM), and Type II diabetes mellitus ([place]).   reports that he quit smoking about 25 years ago. His smoking use included cigarettes. He has a 40.00 pack-year smoking  history. He has quit using smokeless tobacco.  His smokeless tobacco use included chew.  Past Surgical History:  Procedure Laterality Date  . ATRIAL FIBRILLATION ABLATION  early 2000s   "@ [facility]"  . BACK SURGERY    . CARDIAC CATHETERIZATION N/A 07/09/2015   Procedure: Left Heart Cath and Coronary Angiography;  Surgeon: Belva Crome, MD;  Location: [facility];  Service: Cardiovascular;  Laterality: N/A;  . CATARACT  EXTRACTION W/ INTRAOCULAR LENS  IMPLANT, BILATERAL Bilateral   . INGUINAL HERNIA REPAIR Left 2000  . INGUINAL HERNIA REPAIR Right 11/11/2017   Procedure: RIGHT INGUINAL HERNIA REPAIR WITH MESH;  Surgeon: Coralie Keens, MD;  Location: [facility];  Service: General;  Laterality: Right;  . INSERTION OF MESH Right 11/11/2017   Procedure: INSERTION OF MESH;  Surgeon: Coralie Keens, MD;  Location: [facility];  Service: General;  Laterality: Right;  . LEFT LUNG REMOVED      DR Arlyce Dice LUNG CANCER  . LUMBAR MICRODISCECTOMY  1984   "where sciatic nerve goes thru"  . PNEUMONECTOMY Left 1995   lung cancer  . SKIN CANCER AREAS REMOVED FROM EAR AND CHEST      Allergies  Allergen Reactions  . Iodinated Diagnostic Agents Hives, Itching and Other (See Comments)    Other Reaction: stinging  . Doxycycline Nausea And Vomiting  . Empagliflozin     DIZZINESS, HEACAHES  Other reaction(s): headache "[place]"    Immunization History  Administered Date(s) Administered  . Influenza Split 03/20/2013, 03/08/2017, 02/22/2018  . Influenza,inj,Quad PF,6+ Mos 03/11/2017  . Influenza-Unspecified 04/06/2014, 03/26/2015, 03/16/2016  . Pneumococcal Conjugate-13 08/02/2014  . Pneumococcal Polysaccharide-23 05/26/2007, 10/21/2016    Family History  Problem Relation Age of Onset  . Stroke Mother   . Heart failure Father   . Heart failure Other   . Stroke Other   . Cancer Other   . Emphysema Other   . Hypertension Other   . Diabetes Other   . Coronary artery disease Other   . Cancer - Colon Brother   . Stroke Brother   . Healthy Daughter   . Healthy Son      Current Outpatient Medications:  .  acetaminophen (TYLENOL) 500 MG tablet, Take 500 mg by mouth every 6 (six) hours as needed for moderate pain or headache., Disp: , Rfl:  .  baclofen (LIORESAL) 10 MG tablet, Take 10 mg by mouth 3 (three) times daily. PRN, Disp: , Rfl:  .  budesonide-formoterol (SYMBICORT) 160-4.5 MCG/ACT inhaler, Inhale 2 puffs  into the lungs 2 (two) times daily as needed (for shortness of breath)., Disp: , Rfl:  .  cholecalciferol (VITAMIN D3) 25 MCG (1000 UT) tablet, Take 1,000 Units by mouth daily., Disp: , Rfl:  .  Cyanocobalamin (VITAMIN B 12 PO), Take 1 tablet by mouth daily., Disp: , Rfl:  .  esomeprazole (NEXIUM) 40 MG capsule, Take 40 mg by mouth daily at 12 noon., Disp: , Rfl:  .  glimepiride (AMARYL) 2 MG tablet, Take 2 mg by mouth daily., Disp: , Rfl:  .  levothyroxine (SYNTHROID) 100 MCG tablet, Take 100 mcg by mouth daily before breakfast., Disp: , Rfl:  .  losartan (COZAAR) 50 MG tablet, Take 25 mg by mouth daily. , Disp: , Rfl:  .  lubiprostone (AMITIZA) 8 MCG capsule, Take 8 mcg by mouth 2 (two) times daily with a meal. PRN, Disp: , Rfl:  .  metoprolol (LOPRESSOR) 50 MG tablet, Take 50 mg by mouth daily., Disp: , Rfl:  .  nitroGLYCERIN (NITROSTAT) 0.4 MG SL tablet, Place 1 tablet (0.4 mg total) under the tongue every 5 (five) minutes as needed for chest pain., Disp: 25 tablet, Rfl: 2 .  ONE TOUCH ULTRA TEST test strip, , Disp: , Rfl:  .  OVER THE COUNTER MEDICATION, Place 2 drops into both ears daily as needed (for ear aches). [place] Ear Ache Drops, Disp: , Rfl:  .  traMADol (ULTRAM) 50 MG tablet, Take 1 tablet (50 mg total) by mouth every 6 (six) hours as needed for moderate pain or severe pain., Disp: 20 tablet, Rfl: 0 Results for ALEJANDRO, GAMEL "ED" (MRN [id number]) as of 02/21/2019 09:06  Ref. Range 01/16/2019 08:42  Hemoglobin Latest Ref Range: 13.0 - 17.0 g/dL 13.8      Results for RAMAJ, FRANGOS "ED" (MRN [id number]) as of 02/21/2019 09:06  Ref. Range 01/16/2019 08:42  Creatinine Latest Ref Range: 0.61 - 1.24 mg/dL 0.86   Objective:   Vitals:   02/21/19 0856  BP: 126/64  Pulse: 76  SpO2: 99%  Weight: 180 lb 9.6 oz (81.9 kg)  Height: 5\' 10"  (1.778 m)    Estimated body mass index is 25.91 kg/m as calculated from the following:   Height as of this encounter: 5\' 10"  (1.778 m).   Weight as  of this encounter: 180 lb 9.6 oz (81.9 kg).  @WEIGHTCHANGE @  [employer]   02/21/19 0856  Weight: 180 lb 9.6 oz (81.9 kg)     Physical Exam  General Appearance:    Alert, cooperative, no distress, appears stated age - yes , Deconditioned looking - no , OBESE  - no, Sitting on Wheelchair -  no  Head:    Normocephalic, without obvious abnormality, atraumatic  Eyes:    PERRL, conjunctiva/corneas clear,  Ears:    Normal TM's and external ear canals, both ears  Nose:   Nares normal, septum midline, mucosa normal, no drainage    or sinus tenderness. OXYGEN ON  - no . Patient is @ ra   Throat:  MASK  Neck:   Supple, symmetrical, trachea midline, no adenopathy;    thyroid:  no enlargement/tenderness/nodules; no carotid   bruit or JVD  Back:     Symmetric, no curvature, ROM normal, no CVA tenderness  Lungs:     Distress - no , Wheeze no, Barrell Chest - no, Purse lip breathing - no, Crackles - no   Chest Wall:    No tenderness or deformity . INCREASED VEINS LEFT CHEST    Heart:    Regular rate and rhythm, S1 and S2 normal, no rub   or gallop, Murmur - no  Breast Exam:    NOT DONE  Abdomen:     Soft, non-tender, bowel sounds active all four quadrants,    no masses, no organomegaly. Visceral obesity - no  Genitalia:   NOT DONE  Rectal:   NOT DONE  Extremities:   Extremities - normal, Has Cane - no, Clubbing - no, Edema - no  Pulses:   2+ and symmetric all extremities  Skin:   Stigmata of Connective Tissue Disease - no  Lymph nodes:   Cervical, supraclavicular, and axillary nodes normal  Psychiatric:  Neurologic:   Pleasant - yes, Anxious - no, Flat affect - no  CAm-ICU - neg, Alert and Oriented x 3 - yes, Moves all 4s - yes, Speech - normal, Cognition - intact           Assessment:       ICD-10-CM  1. Other form of dyspnea  R06.09 CT CHEST HIGH RESOLUTION    Pulse oximetry, overnight  2. PFO (patent foramen ovale)  Q21.1 ECHOCARDIOGRAM COMPLETE BUBBLE STUDY       Plan:      Patient Instructions     ICD-10-CM   1. Other form of dyspnea  R06.09     Remains unexplained  Need to rule out shunt and lung problems  Plan  - do ECHO Bubble study (will inform Dr Martinique) but we will arrange - do HRCT supine and prone - do ONO on Room Air  Followup  - next few to several weeks but after above     SIGNATURE    Dr. Brand Males, M.D., F.C.C.P,  Pulmonary and Critical Care Medicine Staff Physician, [place] Director - Interstitial Lung Disease  Program  Pulmonary [place] at [place], [place], [address]  Pager: [phone], If no answer or between  15:00h - 7:00h: call [phone] Telephone: [phone]  9:36 AM 02/21/2019

## 2019-02-21 NOTE — Telephone Encounter (Signed)
Thanks [name]. Will forward to our Echo lab.  Zachary Bean

## 2019-02-21 NOTE — Telephone Encounter (Signed)
Malachy Mood this needs to be forwarded to our Echo lab. Dr Chase Caller wants a bubble study on Mr Zachary Bean with specific positioning noted.  peter

## 2019-02-21 NOTE — Telephone Encounter (Signed)
Hi Peter  Thanks for referring [employer] .  Given his previous pneumonectomy and rotation of his heart to the left side I was worried about a PFO particularly manifesting when he is supine or in the lateral decubitus position.  I checked for variations in pulse ox with these positions but did not find that to be the case.  In any event I have ordered a bubble study could you please facilitate with special instructions to try to elicit bubble in the supine or right lateral decubitus position where he is more dyspneic  Thank you     SIGNATURE    Dr. Brand Males, M.D., F.C.C.P,  Pulmonary and Critical Care Medicine Staff Physician, [place] Director - Interstitial Lung Disease  Program  Pulmonary [place] at [place], [place], [address]  Pager: [phone], If no answer or between  15:00h - 7:00h: call [phone] Telephone: [phone]  9:36 AM 02/21/2019

## 2019-02-21 NOTE — Patient Instructions (Signed)
ICD-10-CM   1. Other form of dyspnea  R06.09     Remains unexplained  Need to rule out shunt and lung problems  Plan  - do ECHO Bubble study (will inform Dr Martinique) but we will arrange - do HRCT supine and prone - do ONO on Room Air  Followup  - next few to several weeks but after above

## 2019-02-22 ENCOUNTER — Other Ambulatory Visit (HOSPITAL_COMMUNITY): Payer: PPO

## 2019-02-22 NOTE — Telephone Encounter (Signed)
Spoke to patient bubble study scheduled 10/1 at 9:20 am.

## 2019-02-23 ENCOUNTER — Ambulatory Visit (HOSPITAL_COMMUNITY): Payer: PPO | Attending: Internal Medicine

## 2019-02-23 ENCOUNTER — Telehealth: Payer: Self-pay | Admitting: Internal Medicine

## 2019-02-23 ENCOUNTER — Other Ambulatory Visit: Payer: Self-pay

## 2019-02-23 DIAGNOSIS — Q2112 Patent foramen ovale: Secondary | ICD-10-CM

## 2019-02-23 DIAGNOSIS — Q211 Atrial septal defect: Secondary | ICD-10-CM | POA: Insufficient documentation

## 2019-02-23 NOTE — Telephone Encounter (Signed)
Please let patient Zachary Bean know that echo does not show hole in the heart due to his prior pneumonectomy. Will await results of HRCT    SIGNATURE    Dr. Brand Males, M.D., F.C.C.P,  Pulmonary and Critical Care Medicine Staff Physician, [place] Director - Interstitial Lung Disease  Program  Pulmonary [place] at [place], [place], [address]  Pager: [phone], If no answer or between  15:00h - 7:00h: call [phone] Telephone: [phone]  1:45 PM 02/23/2019

## 2019-02-24 NOTE — Telephone Encounter (Signed)
Called and spoke to patient. Relayed results per Dr. Chase Caller. Patient verbalized understanding and thanked staff for the call. Nothing further needed at this time.

## 2019-03-06 ENCOUNTER — Telehealth: Payer: Self-pay | Admitting: Internal Medicine

## 2019-03-06 NOTE — Telephone Encounter (Signed)
Reviewed Zachary Bean -> overnight oxygen study February 23, 2019 time spent less than 88% this point 7 minutes.  Lowest pulse ox was 85%.  He does not qualify for overnight oxygen

## 2019-03-07 NOTE — Telephone Encounter (Signed)
lmtcb X1 for pt to relay results/recs.

## 2019-03-07 NOTE — Telephone Encounter (Signed)
Pt called back, aware of results/recs.  Nothing further needed at this time- will close encounter.

## 2019-03-09 ENCOUNTER — Telehealth: Payer: Self-pay | Admitting: Internal Medicine

## 2019-03-09 ENCOUNTER — Ambulatory Visit (INDEPENDENT_AMBULATORY_CARE_PROVIDER_SITE_OTHER)
Admission: RE | Admit: 2019-03-09 | Discharge: 2019-03-09 | Disposition: A | Discharge status: Home / Self Care | Payer: PPO | Source: Ambulatory Visit | Attending: Internal Medicine | Admitting: Internal Medicine

## 2019-03-09 ENCOUNTER — Other Ambulatory Visit: Payer: Self-pay

## 2019-03-09 DIAGNOSIS — R0600 Dyspnea, unspecified: Secondary | ICD-10-CM | POA: Diagnosis not present

## 2019-03-09 DIAGNOSIS — R918 Other nonspecific abnormal finding of lung field: Secondary | ICD-10-CM

## 2019-03-09 DIAGNOSIS — R0609 Other forms of dyspnea: Secondary | ICD-10-CM | POA: Diagnosis not present

## 2019-03-09 NOTE — Telephone Encounter (Signed)
Called [facility]. Staff just wanted to make sure the impression was sent over to one of our MDs. Let them know that the impression was sent to Dr. Chase Caller.

## 2019-03-09 NOTE — Telephone Encounter (Signed)
CT Chest dated 03/09/19 Impression reads:  IMPRESSION: 1. No findings to suggest interstitial lung disease. 2. New gas within the chronic fluid collection in the of Accu aided left pleural space. This is of uncertain etiology and significance, but could be seen in the setting of infection, or could indicate leakage from the left bronchial stump. Further clinical evaluation is recommended. 3. Aortic atherosclerosis, in addition to left main and 3 vessel coronary artery disease. Assessment for potential risk factor modification, dietary therapy or pharmacologic therapy may be warranted, if clinically indicated. 4. Cholelithiasis without evidence of acute cholecystitis at this time.  These results will be called to the ordering clinician or representative by the Radiologist Assistant, and communication documented in the PACS or zVision Dashboard.  Aortic Atherosclerosis (ICD10-I70.0).   Electronically Signed   By: Vinnie Langton M.D.   On: 03/09/2019 11:07  Forwarding to Dr Chase Caller

## 2019-03-09 NOTE — Telephone Encounter (Signed)
Stat call report on this pt they can be called @ [phone].Zachary Bean

## 2019-03-10 NOTE — Telephone Encounter (Signed)
Spoke with pt. He is aware of results. Referral has been placed for cardiothoracic surgery. Nothing further was needed.

## 2019-03-10 NOTE — Telephone Encounter (Signed)
Let patient Zachary Bean know that on the CT scan of the chest there is no pulmonary fibrosis but there is a new gas collection that has developed sometime in the last 3 years compared to the previous scan and this is in the left-sided pleural fluid.  Therefore we recommend a referral to thoracic surgery.  I would like thoracic surgery to see the patient next week.  Patient should know if he feels sick with fever he should go to the ER   Ct Chest High Resolution  Addendum Date: 03/09/2019   ADDENDUM REPORT: 03/09/2019 11:51 ADDENDUM: There was an uncorrected voice recognition error in the original dictation. Specifically, impression #2 should read as follows: New gas within the chronic fluid collection in the EVACUATED left pleural space. This is of uncertain etiology and significance, but could be seen in the setting of infection, or could indicate leakage from the left bronchial stump. Further clinical evaluation is recommended. Electronically Signed   By: Vinnie Langton M.D.   On: 03/09/2019 11:51   Result Date: 03/09/2019 CLINICAL DATA:  78 year old male with history of dyspnea. Evaluate for interstitial lung disease. EXAM: CT CHEST WITHOUT CONTRAST TECHNIQUE: Multidetector CT imaging of the chest was performed following the standard protocol without intravenous contrast. High resolution imaging of the lungs, as well as inspiratory and expiratory imaging, was performed. COMPARISON:  Chest CT 11/15/2015. FINDINGS: Cardiovascular: Heart size is normal. There is no significant pericardial fluid, thickening or pericardial calcification. There is aortic atherosclerosis, as well as atherosclerosis of the great vessels of the mediastinum and the coronary arteries, including calcified atherosclerotic plaque in the left main, left anterior descending, left circumflex and right coronary arteries. Mediastinum/Nodes: No pathologically enlarged mediastinal or hilar lymph nodes. Please note that accurate  exclusion of hilar adenopathy is limited on noncontrast CT scans. Esophagus is unremarkable in appearance. No axillary lymphadenopathy. Lungs/Pleura: Status post left pneumonectomy. There is now some gas within the non dependent portion of the fluid collection in the evacuated left pleural space. High-resolution images demonstrate no significant regions of ground-glass attenuation, septal thickening, subpleural reticulation, parenchymal banding, traction bronchiectasis or frank honeycombing. Inspiratory and expiratory imaging is unremarkable. Upper Abdomen: Aortic atherosclerosis. 7 mm calcified gallstone lying dependently in the gallbladder. No findings to suggest an acute cholecystitis at this time. Musculoskeletal: There are no aggressive appearing lytic or blastic lesions noted in the visualized portions of the skeleton. IMPRESSION: 1. No findings to suggest interstitial lung disease. 2. New gas within the chronic fluid collection in the of Accu aided left pleural space. This is of uncertain etiology and significance, but could be seen in the setting of infection, or could indicate leakage from the left bronchial stump. Further clinical evaluation is recommended. 3. Aortic atherosclerosis, in addition to left main and 3 vessel coronary artery disease. Assessment for potential risk factor modification, dietary therapy or pharmacologic therapy may be warranted, if clinically indicated. 4. Cholelithiasis without evidence of acute cholecystitis at this time. These results will be called to the ordering clinician or representative by the Radiologist Assistant, and communication documented in the PACS or zVision Dashboard. Aortic Atherosclerosis (ICD10-I70.0). Electronically Signed: By: Vinnie Langton M.D. On: 03/09/2019 11:07

## 2019-03-10 NOTE — Telephone Encounter (Signed)
MR please advise. Thanks! 

## 2019-03-14 ENCOUNTER — Telehealth: Payer: Self-pay | Admitting: Internal Medicine

## 2019-03-14 DIAGNOSIS — E039 Hypothyroidism, unspecified: Secondary | ICD-10-CM | POA: Diagnosis not present

## 2019-03-14 DIAGNOSIS — J9611 Chronic respiratory failure with hypoxia: Secondary | ICD-10-CM

## 2019-03-14 NOTE — Telephone Encounter (Signed)
Signed ono rsult in side of my desk available for scanning. ALso, orderd 2L [place] at night test

## 2019-03-17 ENCOUNTER — Encounter: Payer: PPO | Admitting: Thoracic Surgery (Cardiothoracic Vascular Surgery)

## 2019-03-20 ENCOUNTER — Encounter: Payer: Self-pay | Admitting: Thoracic Surgery (Cardiothoracic Vascular Surgery)

## 2019-03-20 ENCOUNTER — Other Ambulatory Visit: Payer: Self-pay

## 2019-03-20 ENCOUNTER — Institutional Professional Consult (permissible substitution): Payer: PPO | Admitting: Thoracic Surgery (Cardiothoracic Vascular Surgery)

## 2019-03-20 VITALS — BP 134/64 | HR 88 | Temp 97.8°F | Resp 20 | Ht 70.0 in | Wt 175.0 lb

## 2019-03-20 DIAGNOSIS — J860 Pyothorax with fistula: Secondary | ICD-10-CM | POA: Diagnosis not present

## 2019-03-20 DIAGNOSIS — Z902 Acquired absence of lung [part of]: Secondary | ICD-10-CM | POA: Diagnosis not present

## 2019-03-20 NOTE — Progress Notes (Signed)
[address]       [place] [address]             [phone]                    Zachary Bean Medical Record #[id number] Date of Birth: 01-22-1941  Referring: Brand Males, MD Primary Care: Jonathon Jordan, MD Primary Cardiologist: No primary care provider on file.  Chief Complaint:    Chief Complaint  Patient presents with  . Lung Lesion    Surgical eval, Chest CT 03/09/19, PFT's 02/14/19,     History of Present Illness:    Zachary Bean 78 y.o. male s/p left pneumonectomy in 1995 for lung cancer presents for surgical evaluation of new finding of gas within his left pleural space.  He states that over the last year he has had some increased exertional dyspnea and lethargy.  He also has developed a chronic cough, and can now only lay on his left side.  His last CT scan in 2017 showed a completely filled left pleural space.  Zubrod Score: At the time of surgery this patient's most appropriate activity status/level should be described as: []     0    Normal activity, no symptoms [x]     1    Restricted in physical strenuous activity but ambulatory, able to do out light work []     2    Ambulatory and capable of self care, unable to do work activities, up and about               >50 % of waking hours                              []     3    Only limited self care, in bed greater than 50% of waking hours []     4    Completely disabled, no self care, confined to bed or chair []     5    Moribund   Past Medical History:  Diagnosis Date  . Acute idiopathic pericarditis 2008   s/p fluoroscopic pericardial window at [facility]  . Childhood asthma   . Chronic kidney disease    STAGE 2 CKD FOLLOWED BY PRIMARY DR Wynelle Link  . Chronic lower back pain   . Chronic sinusitis   . Duodenal ulcer, chronic   . GERD (gastroesophageal reflux disease)   . Headache    "at least once/week; sometimes more" (07/09/2015)  . History of kidney stones 1960'S  .  Hyperlipidemia   . Hypothyroidism   . Left trigger finger   . Lung cancer ([place]) 1995   s/p left pneumonectomy in 1995; also treated with radiation  . Normal echocardiogram 08/2011  . Normal nuclear stress test 08/2011   EF was slightly down but normal by echo; no ischemia or scar noted.   Marland Kitchen NSVT (nonsustained ventricular tachycardia) ([place])   . PAF (paroxysmal atrial fibrillation) ([place]) 10 YRS AGO NOT CURRENT PROBLEM   s/p [facility]  . Type II diabetes mellitus ([place])     Past Surgical History:  Procedure Laterality Date  . ATRIAL FIBRILLATION ABLATION  early 2000s   "@ [facility]"  . BACK SURGERY    . CARDIAC CATHETERIZATION N/A 07/09/2015   Procedure: Left Heart Cath and Coronary Angiography;  Surgeon: Belva Crome, MD;  Location: [facility];  Service: Cardiovascular;  Laterality: N/A;  . CATARACT EXTRACTION W/ INTRAOCULAR LENS  IMPLANT, BILATERAL Bilateral   . INGUINAL HERNIA REPAIR Left 2000  . INGUINAL HERNIA REPAIR Right 11/11/2017   Procedure: RIGHT INGUINAL HERNIA REPAIR WITH MESH;  Surgeon: Coralie Keens, MD;  Location: [facility];  Service: General;  Laterality: Right;  . INSERTION OF MESH Right 11/11/2017   Procedure: INSERTION OF MESH;  Surgeon: Coralie Keens, MD;  Location: [facility];  Service: General;  Laterality: Right;  . LEFT LUNG REMOVED      DR Arlyce Dice LUNG CANCER  . LUMBAR MICRODISCECTOMY  1984   "where sciatic nerve goes thru"  . PNEUMONECTOMY Left 1995   lung cancer  . SKIN CANCER AREAS REMOVED FROM EAR AND CHEST      Family History  Problem Relation Age of Onset  . Stroke Mother   . Heart failure Father   . Heart failure Other   . Stroke Other   . Cancer Other   . Emphysema Other   . Hypertension Other   . Diabetes Other   . Coronary artery disease Other   . Cancer - Colon Brother   . Stroke Brother   . Healthy Daughter   . Healthy Son      Social History   Tobacco Use  Smoking Status Former Smoker  . Packs/day: 1.00  . Years:  40.00  . Pack years: 40.00  . Types: Cigarettes  . Quit date: 07/23/1993  . Years since quitting: 25.6  Smokeless Tobacco Former Systems developer  . Types: Chew  Tobacco Comment   "chewed when I was real young"    Social History   Substance and Sexual Activity  Alcohol Use Not Currently     Allergies  Allergen Reactions  . Iodinated Diagnostic Agents Hives, Itching and Other (See Comments)    Other Reaction: stinging  . Doxycycline Nausea And Vomiting  . Empagliflozin     DIZZINESS, HEACAHES  Other reaction(s): headache "[place]"    Current Outpatient Medications  Medication Sig Dispense Refill  . acetaminophen (TYLENOL) 500 MG tablet Take 500 mg by mouth every 6 (six) hours as needed for moderate pain or headache.    . baclofen (LIORESAL) 10 MG tablet Take 10 mg by mouth 3 (three) times daily. PRN    . budesonide-formoterol (SYMBICORT) 160-4.5 MCG/ACT inhaler Inhale 2 puffs into the lungs 2 (two) times daily as needed (for shortness of breath).    . cholecalciferol (VITAMIN D3) 25 MCG (1000 UT) tablet Take 1,000 Units by mouth daily.    . Cyanocobalamin (VITAMIN B 12 PO) Take 1 tablet by mouth daily.    Marland Kitchen esomeprazole (NEXIUM) 40 MG capsule Take 40 mg by mouth daily at 12 noon.    Marland Kitchen glimepiride (AMARYL) 2 MG tablet Take 2 mg by mouth daily.    Marland Kitchen levothyroxine (SYNTHROID) 100 MCG tablet Take 100 mcg by mouth daily before breakfast.    . losartan (COZAAR) 50 MG tablet Take 25 mg by mouth daily.     Marland Kitchen lubiprostone (AMITIZA) 8 MCG capsule Take 8 mcg by mouth 2 (two) times daily with a meal. PRN    . metoprolol (LOPRESSOR) 50 MG tablet Take 50 mg by mouth daily.    . nitroGLYCERIN (NITROSTAT) 0.4 MG SL tablet Place 1 tablet (0.4 mg total) under the tongue every 5 (five) minutes as needed for chest pain. 25 tablet 2  . ONE TOUCH ULTRA TEST test strip     . OVER THE COUNTER MEDICATION Place 2 drops  into both ears daily as needed (for ear aches). [place] Ear Ache Drops    . traMADol (ULTRAM)  50 MG tablet Take 1 tablet (50 mg total) by mouth every 6 (six) hours as needed for moderate pain or severe pain. 20 tablet 0   No current facility-administered medications for this visit.     Review of Systems  Constitutional: Positive for malaise/fatigue. Negative for chills and fever.  HENT: Positive for sore throat.   Respiratory: Positive for cough and shortness of breath.   Gastrointestinal:       Dysphagia   Genitourinary: Positive for frequency.  Musculoskeletal: Positive for myalgias.  Neurological: Positive for tingling, sensory change and headaches.  Endo/Heme/Allergies: Bruises/bleeds easily.     PHYSICAL EXAMINATION: BP 134/64   Pulse 88   Temp 97.8 F (36.6 C) (Skin)   Resp 20   Ht 5\' 10"  (1.778 m)   Wt 175 lb (79.4 kg)   SpO2 96% Comment: RA  BMI 25.11 kg/m  Physical Exam  Diagnostic Studies & Laboratory data:     Recent Radiology Findings:   Ct Chest High Resolution  Addendum Date: 03/09/2019   ADDENDUM REPORT: 03/09/2019 11:51 ADDENDUM: There was an uncorrected voice recognition error in the original dictation. Specifically, impression #2 should read as follows: New gas within the chronic fluid collection in the EVACUATED left pleural space. This is of uncertain etiology and significance, but could be seen in the setting of infection, or could indicate leakage from the left bronchial stump. Further clinical evaluation is recommended. Electronically Signed   By: Vinnie Langton M.D.   On: 03/09/2019 11:51   Result Date: 03/09/2019 CLINICAL DATA:  78 year old male with history of dyspnea. Evaluate for interstitial lung disease. EXAM: CT CHEST WITHOUT CONTRAST TECHNIQUE: Multidetector CT imaging of the chest was performed following the standard protocol without intravenous contrast. High resolution imaging of the lungs, as well as inspiratory and expiratory imaging, was performed. COMPARISON:  Chest CT 11/15/2015. FINDINGS: Cardiovascular: Heart size is normal.  There is no significant pericardial fluid, thickening or pericardial calcification. There is aortic atherosclerosis, as well as atherosclerosis of the great vessels of the mediastinum and the coronary arteries, including calcified atherosclerotic plaque in the left main, left anterior descending, left circumflex and right coronary arteries. Mediastinum/Nodes: No pathologically enlarged mediastinal or hilar lymph nodes. Please note that accurate exclusion of hilar adenopathy is limited on noncontrast CT scans. Esophagus is unremarkable in appearance. No axillary lymphadenopathy. Lungs/Pleura: Status post left pneumonectomy. There is now some gas within the non dependent portion of the fluid collection in the evacuated left pleural space. High-resolution images demonstrate no significant regions of ground-glass attenuation, septal thickening, subpleural reticulation, parenchymal banding, traction bronchiectasis or frank honeycombing. Inspiratory and expiratory imaging is unremarkable. Upper Abdomen: Aortic atherosclerosis. 7 mm calcified gallstone lying dependently in the gallbladder. No findings to suggest an acute cholecystitis at this time. Musculoskeletal: There are no aggressive appearing lytic or blastic lesions noted in the visualized portions of the skeleton. IMPRESSION: 1. No findings to suggest interstitial lung disease. 2. New gas within the chronic fluid collection in the of Accu aided left pleural space. This is of uncertain etiology and significance, but could be seen in the setting of infection, or could indicate leakage from the left bronchial stump. Further clinical evaluation is recommended. 3. Aortic atherosclerosis, in addition to left main and 3 vessel coronary artery disease. Assessment for potential risk factor modification, dietary therapy or pharmacologic therapy may be warranted, if clinically indicated.  4. Cholelithiasis without evidence of acute cholecystitis at this time. These results  will be called to the ordering clinician or representative by the Radiologist Assistant, and communication documented in the PACS or zVision Dashboard. Aortic Atherosclerosis (ICD10-I70.0). Electronically Signed: By: Vinnie Langton M.D. On: 03/09/2019 11:07   2020     2017    I have independently reviewed the above radiology studies  and reviewed the findings with the patient.   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  He is minimally symptomatic.  I explained to both he and his wife, that they would be best served with evaluation at an academic center given the complexity of this finding.  A referral has been made with [facility].    I  spent 30 minutes with  the patient face to face and greater then 50% of the time was spent in counseling and coordination of care.    Lajuana Matte 03/20/2019 2:56 PM

## 2019-04-03 NOTE — Telephone Encounter (Signed)
ONO has already been picked up from MR's office. Checked to see if there had been an order placed for the O2 and no order had been placed. Called and spoke with pt to see if he had been made aware of the results and pt said he had not heard of the results. I relayed the results of the ONO to pt and stated to him that we were going to get him started on 2L O2 at bedtime. Pt verbalized understanding. Order placed. Nothing further needed.

## 2019-04-03 NOTE — Addendum Note (Signed)
Addended by: Lorretta Harp on: 04/03/2019 04:06 PM   Modules accepted: Orders

## 2019-04-04 ENCOUNTER — Telehealth: Payer: Self-pay | Admitting: Internal Medicine

## 2019-04-04 NOTE — Telephone Encounter (Signed)
Called [facility] and spoke with Bethanne Ginger, states that they received an order for pt to receive nocturnal O2 yesterday, but based on the ONO that was performed by [facility] the pt did not qualify for O2.  MR please advise.  Thanks!

## 2019-04-05 DIAGNOSIS — J860 Pyothorax with fistula: Secondary | ICD-10-CM | POA: Diagnosis not present

## 2019-04-05 DIAGNOSIS — Z01818 Encounter for other preprocedural examination: Secondary | ICD-10-CM | POA: Diagnosis not present

## 2019-04-05 NOTE — Telephone Encounter (Signed)
Sorry I might have created this confusion. A month ago I sent a note saying based pn  ONO he does NOT qualify and then I sent a message sometime later tht I had signed for ONO. His desats are mild and he does not qualify. I am happy to review the result again 04/05/2019 .

## 2019-04-05 NOTE — Telephone Encounter (Signed)
Spoke with Zachary Bean at [place]. Advised her that she could disregard the oxygen order. Nothing further was needed.

## 2019-04-11 DIAGNOSIS — E1165 Type 2 diabetes mellitus with hyperglycemia: Secondary | ICD-10-CM | POA: Diagnosis not present

## 2019-04-11 DIAGNOSIS — E8809 Other disorders of plasma-protein metabolism, not elsewhere classified: Secondary | ICD-10-CM | POA: Diagnosis not present

## 2019-04-11 DIAGNOSIS — R601 Generalized edema: Secondary | ICD-10-CM | POA: Diagnosis not present

## 2019-04-11 DIAGNOSIS — N481 Balanitis: Secondary | ICD-10-CM | POA: Diagnosis not present

## 2019-04-11 DIAGNOSIS — R339 Retention of urine, unspecified: Secondary | ICD-10-CM | POA: Diagnosis not present

## 2019-04-11 DIAGNOSIS — Z85118 Personal history of other malignant neoplasm of bronchus and lung: Secondary | ICD-10-CM | POA: Diagnosis not present

## 2019-04-11 DIAGNOSIS — I4891 Unspecified atrial fibrillation: Secondary | ICD-10-CM | POA: Diagnosis not present

## 2019-04-11 DIAGNOSIS — J869 Pyothorax without fistula: Secondary | ICD-10-CM | POA: Diagnosis not present

## 2019-04-11 DIAGNOSIS — E1122 Type 2 diabetes mellitus with diabetic chronic kidney disease: Secondary | ICD-10-CM | POA: Diagnosis not present

## 2019-04-11 DIAGNOSIS — I340 Nonrheumatic mitral (valve) insufficiency: Secondary | ICD-10-CM | POA: Diagnosis not present

## 2019-04-11 DIAGNOSIS — E8770 Fluid overload, unspecified: Secondary | ICD-10-CM | POA: Diagnosis not present

## 2019-04-11 DIAGNOSIS — R570 Cardiogenic shock: Secondary | ICD-10-CM | POA: Diagnosis not present

## 2019-04-11 DIAGNOSIS — E11649 Type 2 diabetes mellitus with hypoglycemia without coma: Secondary | ICD-10-CM | POA: Diagnosis not present

## 2019-04-11 DIAGNOSIS — J860 Pyothorax with fistula: Secondary | ICD-10-CM | POA: Diagnosis not present

## 2019-04-11 DIAGNOSIS — I129 Hypertensive chronic kidney disease with stage 1 through stage 4 chronic kidney disease, or unspecified chronic kidney disease: Secondary | ICD-10-CM | POA: Diagnosis not present

## 2019-04-11 DIAGNOSIS — I10 Essential (primary) hypertension: Secondary | ICD-10-CM | POA: Diagnosis not present

## 2019-04-11 DIAGNOSIS — N179 Acute kidney failure, unspecified: Secondary | ICD-10-CM | POA: Diagnosis not present

## 2019-04-11 DIAGNOSIS — E860 Dehydration: Secondary | ICD-10-CM | POA: Diagnosis not present

## 2019-04-11 DIAGNOSIS — N471 Phimosis: Secondary | ICD-10-CM | POA: Diagnosis not present

## 2019-04-11 DIAGNOSIS — K8020 Calculus of gallbladder without cholecystitis without obstruction: Secondary | ICD-10-CM | POA: Diagnosis not present

## 2019-04-11 DIAGNOSIS — B370 Candidal stomatitis: Secondary | ICD-10-CM | POA: Diagnosis not present

## 2019-04-11 DIAGNOSIS — J948 Other specified pleural conditions: Secondary | ICD-10-CM | POA: Diagnosis not present

## 2019-04-11 DIAGNOSIS — I519 Heart disease, unspecified: Secondary | ICD-10-CM | POA: Diagnosis not present

## 2019-04-11 DIAGNOSIS — I513 Intracardiac thrombosis, not elsewhere classified: Secondary | ICD-10-CM | POA: Diagnosis not present

## 2019-04-11 DIAGNOSIS — Z5332 Thoracoscopic surgical procedure converted to open procedure: Secondary | ICD-10-CM | POA: Diagnosis not present

## 2019-04-11 DIAGNOSIS — B9561 Methicillin susceptible Staphylococcus aureus infection as the cause of diseases classified elsewhere: Secondary | ICD-10-CM | POA: Diagnosis not present

## 2019-04-11 DIAGNOSIS — R9389 Abnormal findings on diagnostic imaging of other specified body structures: Secondary | ICD-10-CM | POA: Diagnosis not present

## 2019-04-11 DIAGNOSIS — R918 Other nonspecific abnormal finding of lung field: Secondary | ICD-10-CM | POA: Diagnosis not present

## 2019-04-11 DIAGNOSIS — J449 Chronic obstructive pulmonary disease, unspecified: Secondary | ICD-10-CM | POA: Diagnosis not present

## 2019-04-11 DIAGNOSIS — E119 Type 2 diabetes mellitus without complications: Secondary | ICD-10-CM | POA: Diagnosis not present

## 2019-04-11 DIAGNOSIS — N183 Chronic kidney disease, stage 3 unspecified: Secondary | ICD-10-CM | POA: Diagnosis not present

## 2019-04-11 DIAGNOSIS — K5900 Constipation, unspecified: Secondary | ICD-10-CM | POA: Diagnosis not present

## 2019-04-11 DIAGNOSIS — I472 Ventricular tachycardia: Secondary | ICD-10-CM | POA: Diagnosis not present

## 2019-04-11 DIAGNOSIS — D72829 Elevated white blood cell count, unspecified: Secondary | ICD-10-CM | POA: Diagnosis not present

## 2019-04-11 DIAGNOSIS — I480 Paroxysmal atrial fibrillation: Secondary | ICD-10-CM | POA: Diagnosis not present

## 2019-04-11 DIAGNOSIS — M79602 Pain in left arm: Secondary | ICD-10-CM | POA: Diagnosis not present

## 2019-04-11 DIAGNOSIS — I351 Nonrheumatic aortic (valve) insufficiency: Secondary | ICD-10-CM | POA: Diagnosis not present

## 2019-04-11 DIAGNOSIS — D631 Anemia in chronic kidney disease: Secondary | ICD-10-CM | POA: Diagnosis not present

## 2019-04-11 DIAGNOSIS — I361 Nonrheumatic tricuspid (valve) insufficiency: Secondary | ICD-10-CM | POA: Diagnosis not present

## 2019-04-11 DIAGNOSIS — E871 Hypo-osmolality and hyponatremia: Secondary | ICD-10-CM | POA: Diagnosis not present

## 2019-04-11 DIAGNOSIS — I4892 Unspecified atrial flutter: Secondary | ICD-10-CM | POA: Diagnosis not present

## 2019-04-11 DIAGNOSIS — Z8679 Personal history of other diseases of the circulatory system: Secondary | ICD-10-CM | POA: Diagnosis not present

## 2019-04-11 DIAGNOSIS — I4819 Other persistent atrial fibrillation: Secondary | ICD-10-CM | POA: Diagnosis not present

## 2019-04-11 DIAGNOSIS — E876 Hypokalemia: Secondary | ICD-10-CM | POA: Diagnosis not present

## 2019-04-11 DIAGNOSIS — J90 Pleural effusion, not elsewhere classified: Secondary | ICD-10-CM | POA: Diagnosis not present

## 2019-04-11 DIAGNOSIS — R05 Cough: Secondary | ICD-10-CM | POA: Diagnosis not present

## 2019-04-11 DIAGNOSIS — E039 Hypothyroidism, unspecified: Secondary | ICD-10-CM | POA: Diagnosis not present

## 2019-04-11 DIAGNOSIS — Z902 Acquired absence of lung [part of]: Secondary | ICD-10-CM | POA: Diagnosis not present

## 2019-04-11 DIAGNOSIS — Z9889 Other specified postprocedural states: Secondary | ICD-10-CM | POA: Diagnosis not present

## 2019-04-11 DIAGNOSIS — Z20828 Contact with and (suspected) exposure to other viral communicable diseases: Secondary | ICD-10-CM | POA: Diagnosis not present

## 2019-05-22 DIAGNOSIS — E1122 Type 2 diabetes mellitus with diabetic chronic kidney disease: Secondary | ICD-10-CM | POA: Diagnosis not present

## 2019-05-22 DIAGNOSIS — Z85828 Personal history of other malignant neoplasm of skin: Secondary | ICD-10-CM | POA: Diagnosis not present

## 2019-05-22 DIAGNOSIS — I129 Hypertensive chronic kidney disease with stage 1 through stage 4 chronic kidney disease, or unspecified chronic kidney disease: Secondary | ICD-10-CM | POA: Diagnosis not present

## 2019-05-22 DIAGNOSIS — C3492 Malignant neoplasm of unspecified part of left bronchus or lung: Secondary | ICD-10-CM | POA: Diagnosis not present

## 2019-05-22 DIAGNOSIS — N189 Chronic kidney disease, unspecified: Secondary | ICD-10-CM | POA: Diagnosis not present

## 2019-05-22 DIAGNOSIS — Z48813 Encounter for surgical aftercare following surgery on the respiratory system: Secondary | ICD-10-CM | POA: Diagnosis not present

## 2019-05-22 DIAGNOSIS — M1990 Unspecified osteoarthritis, unspecified site: Secondary | ICD-10-CM | POA: Diagnosis not present

## 2019-05-22 DIAGNOSIS — I480 Paroxysmal atrial fibrillation: Secondary | ICD-10-CM | POA: Diagnosis not present

## 2019-05-22 DIAGNOSIS — Z7901 Long term (current) use of anticoagulants: Secondary | ICD-10-CM | POA: Diagnosis not present

## 2019-05-22 DIAGNOSIS — Z794 Long term (current) use of insulin: Secondary | ICD-10-CM | POA: Diagnosis not present

## 2019-05-22 DIAGNOSIS — Z87891 Personal history of nicotine dependence: Secondary | ICD-10-CM | POA: Diagnosis not present

## 2019-05-22 DIAGNOSIS — L89152 Pressure ulcer of sacral region, stage 2: Secondary | ICD-10-CM | POA: Diagnosis not present

## 2019-05-22 DIAGNOSIS — J860 Pyothorax with fistula: Secondary | ICD-10-CM | POA: Diagnosis not present

## 2019-05-22 DIAGNOSIS — Z9181 History of falling: Secondary | ICD-10-CM | POA: Diagnosis not present

## 2019-05-22 DIAGNOSIS — E1140 Type 2 diabetes mellitus with diabetic neuropathy, unspecified: Secondary | ICD-10-CM | POA: Diagnosis not present

## 2019-05-22 DIAGNOSIS — Z85118 Personal history of other malignant neoplasm of bronchus and lung: Secondary | ICD-10-CM | POA: Diagnosis not present

## 2019-05-22 DIAGNOSIS — E039 Hypothyroidism, unspecified: Secondary | ICD-10-CM | POA: Diagnosis not present

## 2019-05-26 ENCOUNTER — Inpatient Hospital Stay (HOSPITAL_COMMUNITY)
Admission: EM | Admit: 2019-05-26 | Discharge: 2019-06-26 | DRG: 208 | Disposition: E | Payer: PPO | Attending: Critical Care Medicine | Admitting: Critical Care Medicine

## 2019-05-26 ENCOUNTER — Emergency Department (HOSPITAL_COMMUNITY): Payer: PPO

## 2019-05-26 ENCOUNTER — Encounter (HOSPITAL_COMMUNITY): Payer: Self-pay | Admitting: Emergency Medicine

## 2019-05-26 ENCOUNTER — Other Ambulatory Visit: Payer: Self-pay

## 2019-05-26 DIAGNOSIS — Z0189 Encounter for other specified special examinations: Secondary | ICD-10-CM

## 2019-05-26 DIAGNOSIS — E1140 Type 2 diabetes mellitus with diabetic neuropathy, unspecified: Secondary | ICD-10-CM | POA: Diagnosis not present

## 2019-05-26 DIAGNOSIS — J189 Pneumonia, unspecified organism: Secondary | ICD-10-CM | POA: Diagnosis present

## 2019-05-26 DIAGNOSIS — E860 Dehydration: Secondary | ICD-10-CM | POA: Diagnosis not present

## 2019-05-26 DIAGNOSIS — J860 Pyothorax with fistula: Secondary | ICD-10-CM | POA: Diagnosis not present

## 2019-05-26 DIAGNOSIS — R001 Bradycardia, unspecified: Secondary | ICD-10-CM | POA: Diagnosis not present

## 2019-05-26 DIAGNOSIS — Z515 Encounter for palliative care: Secondary | ICD-10-CM | POA: Diagnosis not present

## 2019-05-26 DIAGNOSIS — R34 Anuria and oliguria: Secondary | ICD-10-CM | POA: Diagnosis not present

## 2019-05-26 DIAGNOSIS — R0602 Shortness of breath: Secondary | ICD-10-CM | POA: Diagnosis not present

## 2019-05-26 DIAGNOSIS — K219 Gastro-esophageal reflux disease without esophagitis: Secondary | ICD-10-CM | POA: Diagnosis not present

## 2019-05-26 DIAGNOSIS — Z923 Personal history of irradiation: Secondary | ICD-10-CM

## 2019-05-26 DIAGNOSIS — G9341 Metabolic encephalopathy: Secondary | ICD-10-CM | POA: Diagnosis not present

## 2019-05-26 DIAGNOSIS — I129 Hypertensive chronic kidney disease with stage 1 through stage 4 chronic kidney disease, or unspecified chronic kidney disease: Secondary | ICD-10-CM | POA: Diagnosis present

## 2019-05-26 DIAGNOSIS — I480 Paroxysmal atrial fibrillation: Secondary | ICD-10-CM | POA: Diagnosis not present

## 2019-05-26 DIAGNOSIS — I361 Nonrheumatic tricuspid (valve) insufficiency: Secondary | ICD-10-CM | POA: Diagnosis not present

## 2019-05-26 DIAGNOSIS — R570 Cardiogenic shock: Secondary | ICD-10-CM | POA: Diagnosis not present

## 2019-05-26 DIAGNOSIS — I513 Intracardiac thrombosis, not elsewhere classified: Secondary | ICD-10-CM | POA: Diagnosis present

## 2019-05-26 DIAGNOSIS — D62 Acute posthemorrhagic anemia: Secondary | ICD-10-CM | POA: Diagnosis present

## 2019-05-26 DIAGNOSIS — J9811 Atelectasis: Secondary | ICD-10-CM | POA: Diagnosis not present

## 2019-05-26 DIAGNOSIS — Z91041 Radiographic dye allergy status: Secondary | ICD-10-CM

## 2019-05-26 DIAGNOSIS — K760 Fatty (change of) liver, not elsewhere classified: Secondary | ICD-10-CM | POA: Diagnosis present

## 2019-05-26 DIAGNOSIS — I4891 Unspecified atrial fibrillation: Secondary | ICD-10-CM | POA: Diagnosis not present

## 2019-05-26 DIAGNOSIS — I2510 Atherosclerotic heart disease of native coronary artery without angina pectoris: Secondary | ICD-10-CM | POA: Diagnosis present

## 2019-05-26 DIAGNOSIS — Z66 Do not resuscitate: Secondary | ICD-10-CM | POA: Diagnosis not present

## 2019-05-26 DIAGNOSIS — Z87891 Personal history of nicotine dependence: Secondary | ICD-10-CM

## 2019-05-26 DIAGNOSIS — I4811 Longstanding persistent atrial fibrillation: Secondary | ICD-10-CM | POA: Diagnosis not present

## 2019-05-26 DIAGNOSIS — Z9289 Personal history of other medical treatment: Secondary | ICD-10-CM

## 2019-05-26 DIAGNOSIS — Z9181 History of falling: Secondary | ICD-10-CM | POA: Diagnosis not present

## 2019-05-26 DIAGNOSIS — R6521 Severe sepsis with septic shock: Secondary | ICD-10-CM | POA: Diagnosis not present

## 2019-05-26 DIAGNOSIS — N170 Acute kidney failure with tubular necrosis: Secondary | ICD-10-CM | POA: Diagnosis not present

## 2019-05-26 DIAGNOSIS — R579 Shock, unspecified: Secondary | ICD-10-CM | POA: Diagnosis not present

## 2019-05-26 DIAGNOSIS — R000 Tachycardia, unspecified: Secondary | ICD-10-CM | POA: Diagnosis not present

## 2019-05-26 DIAGNOSIS — C3492 Malignant neoplasm of unspecified part of left bronchus or lung: Secondary | ICD-10-CM | POA: Diagnosis not present

## 2019-05-26 DIAGNOSIS — Z85118 Personal history of other malignant neoplasm of bronchus and lung: Secondary | ICD-10-CM | POA: Diagnosis not present

## 2019-05-26 DIAGNOSIS — Z85828 Personal history of other malignant neoplasm of skin: Secondary | ICD-10-CM | POA: Diagnosis not present

## 2019-05-26 DIAGNOSIS — N183 Chronic kidney disease, stage 3 unspecified: Secondary | ICD-10-CM | POA: Diagnosis not present

## 2019-05-26 DIAGNOSIS — B965 Pseudomonas (aeruginosa) (mallei) (pseudomallei) as the cause of diseases classified elsewhere: Secondary | ICD-10-CM | POA: Diagnosis present

## 2019-05-26 DIAGNOSIS — I469 Cardiac arrest, cause unspecified: Secondary | ICD-10-CM | POA: Diagnosis not present

## 2019-05-26 DIAGNOSIS — R110 Nausea: Secondary | ICD-10-CM | POA: Diagnosis not present

## 2019-05-26 DIAGNOSIS — E039 Hypothyroidism, unspecified: Secondary | ICD-10-CM | POA: Diagnosis not present

## 2019-05-26 DIAGNOSIS — Z4659 Encounter for fitting and adjustment of other gastrointestinal appliance and device: Secondary | ICD-10-CM

## 2019-05-26 DIAGNOSIS — Z7901 Long term (current) use of anticoagulants: Secondary | ICD-10-CM | POA: Diagnosis not present

## 2019-05-26 DIAGNOSIS — Z978 Presence of other specified devices: Secondary | ICD-10-CM | POA: Diagnosis not present

## 2019-05-26 DIAGNOSIS — K7200 Acute and subacute hepatic failure without coma: Secondary | ICD-10-CM | POA: Diagnosis not present

## 2019-05-26 DIAGNOSIS — Z20822 Contact with and (suspected) exposure to covid-19: Secondary | ICD-10-CM | POA: Diagnosis not present

## 2019-05-26 DIAGNOSIS — Z902 Acquired absence of lung [part of]: Secondary | ICD-10-CM | POA: Diagnosis not present

## 2019-05-26 DIAGNOSIS — E872 Acidosis: Secondary | ICD-10-CM | POA: Diagnosis not present

## 2019-05-26 DIAGNOSIS — Z79899 Other long term (current) drug therapy: Secondary | ICD-10-CM

## 2019-05-26 DIAGNOSIS — M1990 Unspecified osteoarthritis, unspecified site: Secondary | ICD-10-CM | POA: Diagnosis not present

## 2019-05-26 DIAGNOSIS — Z79890 Hormone replacement therapy (postmenopausal): Secondary | ICD-10-CM

## 2019-05-26 DIAGNOSIS — Z4682 Encounter for fitting and adjustment of non-vascular catheter: Secondary | ICD-10-CM | POA: Diagnosis not present

## 2019-05-26 DIAGNOSIS — Z8249 Family history of ischemic heart disease and other diseases of the circulatory system: Secondary | ICD-10-CM

## 2019-05-26 DIAGNOSIS — E440 Moderate protein-calorie malnutrition: Secondary | ICD-10-CM | POA: Diagnosis not present

## 2019-05-26 DIAGNOSIS — Z6825 Body mass index (BMI) 25.0-25.9, adult: Secondary | ICD-10-CM

## 2019-05-26 DIAGNOSIS — Z794 Long term (current) use of insulin: Secondary | ICD-10-CM | POA: Diagnosis not present

## 2019-05-26 DIAGNOSIS — T50995A Adverse effect of other drugs, medicaments and biological substances, initial encounter: Secondary | ICD-10-CM | POA: Diagnosis not present

## 2019-05-26 DIAGNOSIS — E785 Hyperlipidemia, unspecified: Secondary | ICD-10-CM | POA: Diagnosis not present

## 2019-05-26 DIAGNOSIS — N189 Chronic kidney disease, unspecified: Secondary | ICD-10-CM | POA: Diagnosis not present

## 2019-05-26 DIAGNOSIS — Z48813 Encounter for surgical aftercare following surgery on the respiratory system: Secondary | ICD-10-CM | POA: Diagnosis not present

## 2019-05-26 DIAGNOSIS — R079 Chest pain, unspecified: Secondary | ICD-10-CM | POA: Diagnosis not present

## 2019-05-26 DIAGNOSIS — E1122 Type 2 diabetes mellitus with diabetic chronic kidney disease: Secondary | ICD-10-CM | POA: Diagnosis present

## 2019-05-26 DIAGNOSIS — J9601 Acute respiratory failure with hypoxia: Secondary | ICD-10-CM | POA: Diagnosis not present

## 2019-05-26 DIAGNOSIS — J869 Pyothorax without fistula: Secondary | ICD-10-CM | POA: Diagnosis not present

## 2019-05-26 DIAGNOSIS — J329 Chronic sinusitis, unspecified: Secondary | ICD-10-CM | POA: Diagnosis present

## 2019-05-26 DIAGNOSIS — L89152 Pressure ulcer of sacral region, stage 2: Secondary | ICD-10-CM | POA: Diagnosis not present

## 2019-05-26 DIAGNOSIS — I340 Nonrheumatic mitral (valve) insufficiency: Secondary | ICD-10-CM | POA: Diagnosis not present

## 2019-05-26 DIAGNOSIS — Z7951 Long term (current) use of inhaled steroids: Secondary | ICD-10-CM

## 2019-05-26 DIAGNOSIS — A419 Sepsis, unspecified organism: Secondary | ICD-10-CM | POA: Diagnosis not present

## 2019-05-26 DIAGNOSIS — I700 Atherosclerosis of aorta: Secondary | ICD-10-CM | POA: Diagnosis present

## 2019-05-26 DIAGNOSIS — I462 Cardiac arrest due to underlying cardiac condition: Secondary | ICD-10-CM | POA: Diagnosis not present

## 2019-05-26 DIAGNOSIS — Z881 Allergy status to other antibiotic agents status: Secondary | ICD-10-CM

## 2019-05-26 DIAGNOSIS — R0789 Other chest pain: Secondary | ICD-10-CM | POA: Diagnosis not present

## 2019-05-26 DIAGNOSIS — E875 Hyperkalemia: Secondary | ICD-10-CM | POA: Diagnosis not present

## 2019-05-26 DIAGNOSIS — Z79891 Long term (current) use of opiate analgesic: Secondary | ICD-10-CM

## 2019-05-26 DIAGNOSIS — Z452 Encounter for adjustment and management of vascular access device: Secondary | ICD-10-CM | POA: Diagnosis not present

## 2019-05-26 DIAGNOSIS — R918 Other nonspecific abnormal finding of lung field: Secondary | ICD-10-CM | POA: Diagnosis not present

## 2019-05-26 DIAGNOSIS — I4819 Other persistent atrial fibrillation: Secondary | ICD-10-CM | POA: Diagnosis not present

## 2019-05-26 LAB — HEPATIC FUNCTION PANEL
ALT: 18 U/L (ref 0–44)
AST: 21 U/L (ref 15–41)
Albumin: 1.9 g/dL — ABNORMAL LOW (ref 3.5–5.0)
Alkaline Phosphatase: 122 U/L (ref 38–126)
Bilirubin, Direct: <0.1 mg/dL (ref 0.0–0.2)
Total Bilirubin: 0.6 mg/dL (ref 0.3–1.2)
Total Protein: 6.8 g/dL (ref 6.5–8.1)

## 2019-05-26 LAB — BASIC METABOLIC PANEL
Anion gap: 10 (ref 5–15)
BUN: 17 mg/dL (ref 8–23)
CO2: 27 mmol/L (ref 22–32)
Calcium: 8.4 mg/dL — ABNORMAL LOW (ref 8.9–10.3)
Chloride: 92 mmol/L — ABNORMAL LOW (ref 98–111)
Creatinine, Ser: 1.31 mg/dL — ABNORMAL HIGH (ref 0.61–1.24)
GFR calc Af Amer: >60 mL/min (ref >60)
GFR calc non Af Amer: 53 mL/min — ABNORMAL LOW (ref >60)
Glucose, Bld: 177 mg/dL — ABNORMAL HIGH (ref 70–99)
Potassium: 4.7 mmol/L (ref 3.5–5.1)
Sodium: 129 mmol/L — ABNORMAL LOW (ref 135–145)

## 2019-05-26 LAB — GLUCOSE, CAPILLARY
Glucose-Capillary: 112 mg/dL — ABNORMAL HIGH (ref 70–99)
Glucose-Capillary: 79 mg/dL (ref 70–99)

## 2019-05-26 LAB — BRAIN NATRIURETIC PEPTIDE: B Natriuretic Peptide: 175.6 pg/mL — ABNORMAL HIGH (ref 0.0–100.0)

## 2019-05-26 LAB — MRSA PCR SCREENING: MRSA by PCR: NEGATIVE

## 2019-05-26 LAB — CBC
HCT: 32.2 % — ABNORMAL LOW (ref 39.0–52.0)
Hemoglobin: 10 g/dL — ABNORMAL LOW (ref 13.0–17.0)
MCH: 28.7 pg (ref 26.0–34.0)
MCHC: 31.1 g/dL (ref 30.0–36.0)
MCV: 92.5 fL (ref 80.0–100.0)
Platelets: 352 10*3/uL (ref 150–400)
RBC: 3.48 MIL/uL — ABNORMAL LOW (ref 4.22–5.81)
RDW: 14.6 % (ref 11.5–15.5)
WBC: 7.4 10*3/uL (ref 4.0–10.5)
nRBC: 0 % (ref 0.0–0.2)

## 2019-05-26 LAB — LACTIC ACID, PLASMA
Lactic Acid, Venous: 2.7 mmol/L (ref 0.5–1.9)
Lactic Acid, Venous: 1.9 mmol/L (ref 0.5–1.9)

## 2019-05-26 LAB — SARS CORONAVIRUS 2 (TAT 6-24 HRS): SARS Coronavirus 2: NEGATIVE

## 2019-05-26 LAB — TROPONIN I (HIGH SENSITIVITY)
Troponin I (High Sensitivity): 12 ng/L (ref <18)
Troponin I (High Sensitivity): 14 ng/L (ref <18)

## 2019-05-26 MED ORDER — MOMETASONE FURO-FORMOTEROL FUM 200-5 MCG/ACT IN AERO
2.0000 | INHALATION_SPRAY | Freq: Two times a day (BID) | RESPIRATORY_TRACT | Status: DC
  Administered 2019-05-27: 2 via RESPIRATORY_TRACT
  Filled 2019-05-26 (×2): qty 8.8

## 2019-05-26 MED ORDER — VANCOMYCIN HCL 1750 MG/350ML IV SOLN
1750.0000 mg | Freq: Once | INTRAVENOUS | Status: AC
  Administered 2019-05-26: 1750 mg via INTRAVENOUS
  Filled 2019-05-26: qty 350

## 2019-05-26 MED ORDER — METRONIDAZOLE IN NACL 5-0.79 MG/ML-% IV SOLN
500.0000 mg | Freq: Once | INTRAVENOUS | Status: AC
  Administered 2019-05-26: 500 mg via INTRAVENOUS
  Filled 2019-05-26: qty 100

## 2019-05-26 MED ORDER — INSULIN ASPART 100 UNIT/ML ~~LOC~~ SOLN
0.0000 [IU] | Freq: Three times a day (TID) | SUBCUTANEOUS | Status: DC
  Administered 2019-05-27: 1 [IU] via SUBCUTANEOUS
  Administered 2019-05-29: 2 [IU] via SUBCUTANEOUS
  Administered 2019-05-29: 1 [IU] via SUBCUTANEOUS

## 2019-05-26 MED ORDER — VANCOMYCIN HCL 1250 MG/250ML IV SOLN
1250.0000 mg | INTRAVENOUS | Status: DC
  Filled 2019-05-26 (×2): qty 250

## 2019-05-26 MED ORDER — TRAMADOL HCL 50 MG PO TABS
50.0000 mg | ORAL_TABLET | Freq: Four times a day (QID) | ORAL | Status: DC | PRN
  Administered 2019-05-26: 50 mg via ORAL
  Filled 2019-05-26: qty 1

## 2019-05-26 MED ORDER — DILTIAZEM HCL 60 MG PO TABS
120.0000 mg | ORAL_TABLET | Freq: Three times a day (TID) | ORAL | Status: DC
  Administered 2019-05-26: 120 mg via ORAL
  Filled 2019-05-26 (×4): qty 2

## 2019-05-26 MED ORDER — ACETAMINOPHEN 500 MG PO TABS: 500.0000 mg | ORAL_TABLET | Freq: Four times a day (QID) | ORAL | Status: DC | PRN

## 2019-05-26 MED ORDER — NITROGLYCERIN 0.4 MG SL SUBL: 0.4000 mg | SUBLINGUAL_TABLET | SUBLINGUAL | Status: DC | PRN

## 2019-05-26 MED ORDER — PANTOPRAZOLE SODIUM 40 MG PO TBEC: 40.0000 mg | DELAYED_RELEASE_TABLET | Freq: Every day | ORAL | Status: DC

## 2019-05-26 MED ORDER — SODIUM CHLORIDE 0.9 % IV SOLN
2.0000 g | Freq: Two times a day (BID) | INTRAVENOUS | Status: DC
  Administered 2019-05-26 – 2019-05-27 (×3): 2 g via INTRAVENOUS
  Filled 2019-05-26 (×5): qty 2

## 2019-05-26 MED ORDER — LUBIPROSTONE 8 MCG PO CAPS
8.0000 ug | ORAL_CAPSULE | Freq: Two times a day (BID) | ORAL | Status: DC | PRN
  Filled 2019-05-26: qty 1

## 2019-05-26 MED ORDER — APIXABAN 5 MG PO TABS
5.0000 mg | ORAL_TABLET | Freq: Two times a day (BID) | ORAL | Status: DC
  Administered 2019-05-26 – 2019-05-27 (×2): 5 mg via ORAL
  Filled 2019-05-26 (×2): qty 1

## 2019-05-26 MED ORDER — AMIODARONE HCL 200 MG PO TABS: 200.0000 mg | ORAL_TABLET | Freq: Every day | ORAL | Status: DC

## 2019-05-26 MED ORDER — SODIUM CHLORIDE 0.9 % IV SOLN
INTRAVENOUS | Status: DC
  Administered 2019-05-26: 999 mL via INTRAVENOUS

## 2019-05-26 MED ORDER — BACLOFEN 10 MG PO TABS
10.0000 mg | ORAL_TABLET | Freq: Three times a day (TID) | ORAL | Status: DC
  Administered 2019-05-26 – 2019-05-27 (×2): 10 mg via ORAL
  Filled 2019-05-26 (×5): qty 1

## 2019-05-26 MED ORDER — INSULIN GLARGINE 100 UNIT/ML ~~LOC~~ SOLN
12.0000 [IU] | Freq: Every day | SUBCUTANEOUS | Status: DC
  Administered 2019-05-26 – 2019-05-28 (×3): 12 [IU] via SUBCUTANEOUS
  Filled 2019-05-26 (×4): qty 0.12

## 2019-05-26 MED ORDER — SODIUM CHLORIDE 0.9% FLUSH: 3.0000 mL | Freq: Once | INTRAVENOUS | Status: DC

## 2019-05-26 MED ORDER — LEVOTHYROXINE SODIUM 100 MCG PO TABS
100.0000 ug | ORAL_TABLET | Freq: Every day | ORAL | Status: DC
  Administered 2019-05-28 – 2019-05-29 (×2): 100 ug via ORAL
  Filled 2019-05-26 (×2): qty 1

## 2019-05-26 MED ORDER — METOPROLOL TARTRATE 50 MG PO TABS
100.0000 mg | ORAL_TABLET | Freq: Three times a day (TID) | ORAL | Status: DC
  Administered 2019-05-26: 100 mg via ORAL
  Filled 2019-05-26: qty 2

## 2019-05-26 MED ORDER — ACETAMINOPHEN 325 MG PO TABS
650.0000 mg | ORAL_TABLET | Freq: Once | ORAL | Status: AC
  Administered 2019-05-26: 650 mg via ORAL
  Filled 2019-05-26: qty 2

## 2019-05-26 NOTE — H&P (Signed)
History and Physical    Zachary Bean DGU:[id number] DOB: Oct 03, 1940 DOA: 06/01/2019  PCP: Jonathon Jordan, MD   Patient coming from: Home I have personally briefly reviewed patient's old medical records in [place]  Chief Complaint: Palpitations and short of breath  HPI: Zachary Bean is a 79 y.o. male with medical history significant of lung cancer status post left pneumonectomy and radiation therapy in 1995, pericardial effusion status post pericardial window in 2008, IDDM, hypothyroidism, paroxysmal A. Fib, who was recently just discharged from [facility] after managing a complicated left bronchopleural fistula, for which patient underwent thoracoscopy and bronchoscopy, and patient was discharged on Zyvox for positive culture of MSSA from the fistula/pleural/skin.  During that admission, he developed uncontrolled a flutter, initially there was a plan for cardioversion since patient A. flutter was very difficult to control with multiple rate control medications, however echocardiogram showed LA thrombosis and plan was aborted. Patient's heart rate eventually was controlled with multiple heart rate control medications.  Patient was then sent home after a left pleural window done and packing for active drainage.  He was told to follow-up with [facility] CT surgeon on January 14.  And instruction for wound packing on Monday Wednesday Friday for dressing change.  However patient went home and started to feel frequent palpitations, and he became increasingly short of breath for the last 2 days with minimum activity, and any cough no chest pains.  And he found he has increasing drainage from the chest wall opening, but denied any fever chills, no chest pain no abdominal pain no diarrhea, he has 3 days of Zyvox left. ED Course: Poorly controlled A. Fib.  ED cultured the fluid from the chest wall opening, discussed with pharmacy and recommend change antibiotics coverage to empyema.  Review of Systems:  As per HPI otherwise 10 point review of systems negative.    Past Medical History:  Diagnosis Date  . Acute idiopathic pericarditis 2008   s/p fluoroscopic pericardial window at [facility]  . Childhood asthma   . Chronic kidney disease    STAGE 2 CKD FOLLOWED BY PRIMARY DR Wynelle Link  . Chronic lower back pain   . Chronic sinusitis   . Duodenal ulcer, chronic   . GERD (gastroesophageal reflux disease)   . Headache    "at least once/week; sometimes more" (07/09/2015)  . History of kidney stones 1960'S  . Hyperlipidemia   . Hypothyroidism   . Left trigger finger   . Lung cancer ([place]) 1995   s/p left pneumonectomy in 1995; also treated with radiation  . Normal echocardiogram 08/2011  . Normal nuclear stress test 08/2011   EF was slightly down but normal by echo; no ischemia or scar noted.   Marland Kitchen NSVT (nonsustained ventricular tachycardia) ([place])   . PAF (paroxysmal atrial fibrillation) ([place]) 10 YRS AGO NOT CURRENT PROBLEM   s/p ablation at [facility]  . Type II diabetes mellitus ([place])     Past Surgical History:  Procedure Laterality Date  . ATRIAL FIBRILLATION ABLATION  early 2000s   "@ [facility]"  . BACK SURGERY    . CARDIAC CATHETERIZATION N/A 07/09/2015   Procedure: Left Heart Cath and Coronary Angiography;  Surgeon: Belva Crome, MD;  Location: [place] [facility];  Service: Cardiovascular;  Laterality: N/A;  . CATARACT EXTRACTION W/ INTRAOCULAR LENS  IMPLANT, BILATERAL Bilateral   . INGUINAL HERNIA REPAIR Left 2000  . INGUINAL HERNIA REPAIR Right 11/11/2017   Procedure: RIGHT INGUINAL HERNIA REPAIR WITH MESH;  Surgeon:  Coralie Keens, MD;  Location: [facility];  Service: General;  Laterality: Right;  . INSERTION OF MESH Right 11/11/2017   Procedure: INSERTION OF MESH;  Surgeon: Coralie Keens, MD;  Location: [facility];  Service: General;  Laterality: Right;  . LEFT LUNG REMOVED      DR Arlyce Dice LUNG CANCER  . LUMBAR MICRODISCECTOMY  1984   "where sciatic nerve goes thru"  . PNEUMONECTOMY Left  1995   lung cancer  . SKIN CANCER AREAS REMOVED FROM EAR AND CHEST       reports that he quit smoking about 25 years ago. His smoking use included cigarettes. He has a 40.00 pack-year smoking history. He has quit using smokeless tobacco.  His smokeless tobacco use included chew. He reports previous alcohol use. He reports that he does not use drugs.  Allergies  Allergen Reactions  . Iodinated Diagnostic Agents Hives, Itching and Other (See Comments)    Other Reaction: stinging  . Doxycycline Nausea And Vomiting  . Empagliflozin     DIZZINESS, HEACAHES  Other reaction(s): headache "Jardiance"    Family History  Problem Relation Age of Onset  . Stroke Mother   . Heart failure Father   . Heart failure Other   . Stroke Other   . Cancer Other   . Emphysema Other   . Hypertension Other   . Diabetes Other   . Coronary artery disease Other   . Cancer - Colon Brother   . Stroke Brother   . Healthy Daughter   . Healthy Son       Prior to Admission medications   Medication Sig Start Date End Date Taking? Authorizing Provider  acetaminophen (TYLENOL) 500 MG tablet Take 500 mg by mouth every 6 (six) hours as needed for moderate pain or headache.    [provider]  baclofen (LIORESAL) 10 MG tablet Take 10 mg by mouth 3 (three) times daily. PRN    [provider]  budesonide-formoterol (SYMBICORT) 160-4.5 MCG/ACT inhaler Inhale 2 puffs into the lungs 2 (two) times daily as needed (for shortness of breath).    [provider]  cholecalciferol (VITAMIN D3) 25 MCG (1000 UT) tablet Take 1,000 Units by mouth daily.    [provider]  Cyanocobalamin (VITAMIN B 12 PO) Take 1 tablet by mouth daily.    [provider]  esomeprazole (NEXIUM) 40 MG capsule Take 40 mg by mouth daily at 12 noon.    [provider]  glimepiride (AMARYL) 2 MG tablet Take 2 mg by mouth daily. 06/07/18   [provider]  levothyroxine (SYNTHROID) 100 MCG  tablet Take 100 mcg by mouth daily before breakfast.    [provider]  losartan (COZAAR) 50 MG tablet Take 25 mg by mouth daily.  06/01/11   [provider]  lubiprostone (AMITIZA) 8 MCG capsule Take 8 mcg by mouth 2 (two) times daily with a meal. PRN    [provider]  metoprolol (LOPRESSOR) 50 MG tablet Take 50 mg by mouth daily. 06/12/13   [provider]  nitroGLYCERIN (NITROSTAT) 0.4 MG SL tablet Place 1 tablet (0.4 mg total) under the tongue every 5 (five) minutes as needed for chest pain. 09/06/11 03/20/19  Richardson Dopp T, PA-C  ONE TOUCH ULTRA TEST test strip  04/11/18   [provider]  OVER THE COUNTER MEDICATION Place 2 drops into both ears daily as needed (for ear aches). [place] Ear Ache Drops    [provider]  traMADol Veatrice Bourbon)  50 MG tablet Take 1 tablet (50 mg total) by mouth every 6 (six) hours as needed for moderate pain or severe pain. 11/12/17   Coralie Keens, MD    Physical Exam: Vitals:   06/04/2019 1600 06/04/2019 1630 05/30/2019 1730 06/08/2019 1830  BP: 136/71 122/72 113/65 105/72  Pulse: (!) 135 (!) 103 (!) 118 (!) 123  Resp: (!) 22 (!) 25 (!) 29 (!) 27  Temp:      TempSrc:      SpO2: 95% 97% 97% 95%  Weight:      Height:        Constitutional: NAD, calm, comfortable Vitals:   06/20/2019 1600 06/23/2019 1630 06/15/2019 1730 06/17/2019 1830  BP: 136/71 122/72 113/65 105/72  Pulse: (!) 135 (!) 103 (!) 118 (!) 123  Resp: (!) 22 (!) 25 (!) 29 (!) 27  Temp:      TempSrc:      SpO2: 95% 97% 97% 95%  Weight:      Height:       Eyes: PERRL, lids and conjunctivae normal ENMT: Mucous membranes are moist. Posterior pharynx clear of any exudate or lesions.Normal dentition.  Neck: normal, supple, no masses, no thyromegaly Respiratory: Shallow breathing breathing clear to auscultation bilaterally, no wheezing, no crackles. Normal respiratory effort. No accessory muscle use.  Cardiovascular: Irregular heartbeat tachycardia.  No extremity edema. 2+ pedal pulses. No carotid bruits.  Abdomen: no tenderness, no masses palpated. No hepatosplenomegaly. Bowel sounds positive.  Musculoskeletal: no clubbing / cyanosis. No joint deformity upper and lower extremities. Good ROM, no contractures. Normal muscle tone.  An opening on the left posterior chest wall, with straw-colored thin drainage, no significant for smelling, no rash around opening. Skin: no rashes, lesions, ulcers. No induration Neurologic: CN 2-12 grossly intact. Sensation intact, DTR normal. Strength 5/5 in all 4.  Psychiatric: Normal judgment and insight. Alert and oriented x 3. Normal mood.       Labs on Admission: I have personally reviewed following labs and imaging studies  CBC: Recent Labs  Lab 05/30/2019 1106  WBC 7.4  HGB 10.0*  HCT 32.2*  MCV 92.5  PLT 315   Basic Metabolic Panel: Recent Labs  Lab 06/19/2019 1106  NA 129*  K 4.7  CL 92*  CO2 27  GLUCOSE 177*  BUN 17  CREATININE 1.31*  CALCIUM 8.4*   GFR: Estimated Creatinine Clearance: 49.5 mL/min (A) (by C-G formula based on SCr of 1.31 mg/dL (H)). Liver Function Tests: Recent Labs  Lab 06/04/2019 1106  AST 21  ALT 18  ALKPHOS 122  BILITOT 0.6  PROT 6.8  ALBUMIN 1.9*   No results for input(s): LIPASE, AMYLASE in the last 168 hours. No results for input(s): AMMONIA in the last 168 hours. Coagulation Profile: No results for input(s): INR, PROTIME in the last 168 hours. Cardiac Enzymes: No results for input(s): CKTOTAL, CKMB, CKMBINDEX, TROPONINI in the last 168 hours. BNP (last 3 results) No results for input(s): PROBNP in the last 8760 hours. HbA1C: No results for input(s): HGBA1C in the last 72 hours. CBG: No results for input(s): GLUCAP in the last 168 hours. Lipid Profile: No results for input(s): CHOL, HDL, LDLCALC, TRIG, CHOLHDL, LDLDIRECT in the last 72 hours. Thyroid Function Tests: No results for input(s): TSH, T4TOTAL, FREET4, T3FREE, THYROIDAB in the last  72 hours. Anemia Panel: No results for input(s): VITAMINB12, FOLATE, FERRITIN, TIBC, IRON, RETICCTPCT in the last 72 hours. Urine analysis:    Component Value Date/Time   COLORURINE YELLOW 10/04/2014  [place] 10/04/2014 1329   LABSPEC 1.014 10/04/2014 1329   PHURINE 6.0 10/04/2014 1329   GLUCOSEU 250 (A) 10/04/2014 1329   HGBUR MODERATE (A) 10/04/2014 1329   BILIRUBINUR NEGATIVE 10/04/2014 1329   KETONESUR 15 (A) 10/04/2014 1329   PROTEINUR 100 (A) 10/04/2014 1329   UROBILINOGEN 1.0 10/04/2014 1329   NITRITE NEGATIVE 10/04/2014 1329   LEUKOCYTESUR NEGATIVE 10/04/2014 1329    Radiological Exams on Admission: DG Chest 2 View  Result Date: 06/19/2019 CLINICAL DATA:  Chest pain. Status post lobectomy. EXAM: CHEST - 2 VIEW COMPARISON:  January 16, 2019. FINDINGS: Stable cardiomediastinal silhouette. No pneumothorax is noted. Right lung is clear. Postsurgical changes are noted in the left lung base. Left basilar opacity is noted concerning for pneumonia or atelectasis with associated effusion. Bony thorax is unremarkable. IMPRESSION: Postsurgical changes are noted in left lung base. Left basilar opacity is noted concerning for pneumonia or atelectasis with associated effusion. Electronically Signed   By: Marijo Conception M.D.   On: 06/16/2019 11:48   CT Chest Wo Contrast  Result Date: 06/23/2019 CLINICAL DATA:  Left pneumonectomy 25 years ago secondary to cancer. Exploratory surgery 5 weeks ago at outside hospital secondary to fistula between the bronchus and esophagus. Substernal chest pain. Shortness of breath. Increased drainage. EXAM: CT CHEST WITHOUT CONTRAST TECHNIQUE: Multidetector CT imaging of the chest was performed following the standard protocol without IV contrast. COMPARISON:  Chest radiograph earlier today.  03/09/2019 chest CT. FINDINGS: Cardiovascular: Aortic and branch vessel atherosclerosis. Tortuous thoracic aorta. Normal heart size, without pericardial effusion.  Multivessel coronary artery atherosclerosis. Mediastinum/Nodes: No supraclavicular adenopathy. No mediastinal or definite hilar adenopathy, given limitations of unenhanced CT. Normal appearance of the esophagus, without communication to the bronchus or pneumonectomy bed identified. Lungs/Pleura: Small right pleural fluid is new since the prior CT. Calcified right upper lobe granuloma. Posterior right upper lobe mild airspace and ground-glass opacity is new, including on 64/6. Subpleural right lower lobe 4 mm pulmonary nodule on 90/6 is new since the prior, most likely a subpleural lymph node. Mild centrilobular emphysema. Left pneumonectomy. Interval evacuation of the fluid within the pneumonectomy bed. There is gas and presumed packing material within the operative bed. Presumed postsurgical communication to the skin surface superiorly on 37/3 and more inferiorly and laterally on 47/3. Upper Abdomen: Mild hepatic steatosis. Normal imaged portions of the spleen, stomach, pancreas, gallbladder, adrenal glands, left kidney. Musculoskeletal: Postsurgical left rib defects. Moderate mid and lower thoracic spondylosis. IMPRESSION: 1. Since 03/09/2019, drainage of the left pneumonectomy bed fluid, with placement of presumed surgical packing material. Postsurgical communication of the operative bed to the posterolateral left chest wall. 2. New small right pleural effusion with inferior right upper lobe airspace disease, suspicious for mild infection or aspiration. 3. Aortic atherosclerosis (ICD10-I70.0), coronary artery atherosclerosis and emphysema (ICD10-J43.9). 4. Hepatic steatosis. Electronically Signed   By: Abigail Miyamoto M.D.   On: 06/07/2019 14:51    EKG: Independently reviewed.   Assessment/Plan Active Problems:   A-fib (HCC)  Uncontrolled A. Fib, discussed with on-call cardiology attending, does not recommend additional new rate control medications, will titrate up metoprolol first, just does not recommend  rate load amiodarone.  And his Cardizem dosage is maximum.  Bronchopleural fistula infection, discussed with CT surgeon Dr. Zenia Resides, recommend patient go back to [facility] for further management.  Patient however declined offer and wished to stay at [facility] for now. CT surgeon also suggest without significant signs of sepsis likely the infection being  and will be chronic, as a result, is more important for wound care.  CT surgeon suggested that wound wound care team to follow exactly the same regimen from [facility] discharge instruction.  IDDM, insulin Lantus plus sliding scale.  AKI on CKD stage III, probably from dehydration we will give slow hydration tonight recheck BMP in the morning.      DVT prophylaxis: On Eliquis Code Status: Full code Family Communication: None at bedside  disposition Plan: PCU Consults called: Cardiology and CT surgeon Admission status: PCU   Lequita Halt MD [facility] Pager [phone]  If 7PM-7AM, please contact night-coverage [email or web address] Password [facility]  06/17/2019, 6:47 PM

## 2019-05-26 NOTE — ED Notes (Signed)
Attempted report x1. 

## 2019-05-26 NOTE — Progress Notes (Signed)
Pharmacy Antibiotic Note  Zachary Bean is a 79 y.o. male admitted on 06/08/2019 with chest pain.  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  Patient is having foul smelling output from recent lobectomy.  Pharmacy has been consulted for vancomycin and cefepime dosing for PNA.  Noted documentation of patient being on Zyvox and to complete treatment course soon.  SCr 1.31, CrCL 50 ml/min, afebrile, WBC WNL.  Plan: Vanc 1750mg  IV x 1, then 1250mg  IV Q24H for AUC 478 using SCr 1.31 Cefepime 2gm IV Q12H Monitor renal fxn, clinical progress, vanc AUC as indicated F/U CT  Height: 5\' 10"  (177.8 cm) Weight: 176 lb (79.8 kg) IBW/kg (Calculated) : 73  Temp (24hrs), Avg:98 F (36.7 C), Min:98 F (36.7 C), Max:98 F (36.7 C)  Recent Labs  Lab 06/21/2019 1106  WBC 7.4  CREATININE 1.31*    Estimated Creatinine Clearance: 49.5 mL/min (A) (by C-G formula based on SCr of 1.31 mg/dL (H)).    Allergies  Allergen Reactions  . Iodinated Diagnostic Agents Hives, Itching and Other (See Comments)    Other Reaction: stinging  . Doxycycline Nausea And Vomiting  . Empagliflozin     DIZZINESS, HEACAHES  Other reaction(s): headache "Jardiance"    Zyvox PTA for several months, to complete course in a few days Augmentin PTA 12/11 >> ?12/24 per [facility] note 11/19  Vanc 1/1 >> Cefepime 1/1 >> Flagyl x1 1/1  1/1 covid -  1/1 left lung fluid cx -  1/1 BCx -   [name] D. Mina Marble, PharmD, BCPS, [place] 06/08/2019, 2:01 PM

## 2019-05-26 NOTE — ED Notes (Signed)
ED Provider at bedside. 

## 2019-05-26 NOTE — Significant Event (Addendum)
Rapid Response Event Note  Overview: Called d/t hypotension and bradycardia. Pt here with Afib/SOB and increased drainage from chest wall opening. Pt arrived to [facility] @ [facility] in afib-120s-130s, SBP-120s, and SOB. Pt given 120mg  cardizem, 100mg  lopressor, and 50mg  ultram @ 2100 d/t increased HR/Afib. Around 2330, pt HR began to brady into 30s-50s(still Afib), SBP dropped to 70s, and pt became more SOB.  Time Called: 2339 Arrival Time: 2343 Event Type: Respiratory, Cardiac  Initial Focused Assessment: Pt sitting up in bed, alert and oriented, denies pain or SOB. Pt looks like he is having difficulty breathing. He is in the tripod position and is pursed lip breathing, RR-24, SpO2-94% on 4LNC. Pt unable to speak to me in complete sentences. Lungs with diminished to no breath sounds in L lung and scattered crackles in R lung. HR-30s-50s Afib with SBP-78/55. Skin warm and dry.  During assessment, pt begin to c/o nausea.   Interventions: PCXR EKG 500cc bolus 10mg  reglan IV x 1 Trop ABG Plan of Care (if not transferred): Pt appears symptomatic to decreased HR. He is nauseous and more SOB than usual, his BP is low. Give 500cc bolus while monitoring respiratory status closely. 10mg  reglan for nausea. Await PCXR/ABG results and notify NP of abnormalities. Call RRT if pt declines further or if further assistance needed.  0104-[place] notified HR dipping into 20s, pt has had no improvement. NP to bedside, cards paged.  Event Summary:   at   [facility], NP @0005     at          [place], Carren Rang

## 2019-05-26 NOTE — ED Triage Notes (Signed)
Pt arrives via [facility] with c/c of cp that began at 4am substernal: pt has had 1 sl nitro & 324 mg asa. Pt also has sob with exertion.    Pt had a recent lobectomy with drain still in place.    Hx of afib chf.

## 2019-05-26 NOTE — ED Provider Notes (Signed)
[facility] EMERGENCY DEPARTMENT Provider Note   CSN: [id number] Arrival date & time: 05/30/2019  1059     History Chief Complaint  Patient presents with  . Chest Pain    Zachary Bean is a 79 y.o. male presenting for evaluation of chest pain and shortness of breath.  Patient states around 4:00 this morning he had acute onset chest pain.  Chest pain was severe, radiates to his back and his left arm.  It has been constant since, but improved with nitro x2.  He continues to have mild discomfort.  He also reports worsening shortness of breath, though he has had issues with shortness of breath for the past year.  Patient has a complicated pulmonary history, had lung cancer which was treated 1995.  Over the past year, worsening shortness of breath, eventually resulting in a left thoracotomy and pleural window at [facility] on 04/13/2019.  This was complicated by new onset A. Fib. Patient denies fevers, chills, cough, nausea, vomiting, abdominal pain, urinary symptoms, abnormal bowel movements.  He does report worsening leg swelling, has a history of heart failure.  Is not sure if he is taking Lasix or other fluid pills, but states he is taking all his medications as prescribed.  He is taking Eliquis as prescribed.  He has had his home meds today.  He does not know if there is been change in the output from his left lung, states it is changed by his home health nurse and they have not commented on it.  He is still taking linezolid, has a few days before completing course.  HPI     Past Medical History:  Diagnosis Date  . Acute idiopathic pericarditis 2008   s/p fluoroscopic pericardial window at [facility]  . Childhood asthma   . Chronic kidney disease    STAGE 2 CKD FOLLOWED BY PRIMARY DR Wynelle Link  . Chronic lower back pain   . Chronic sinusitis   . Duodenal ulcer, chronic   . GERD (gastroesophageal reflux disease)   . Headache    "at least once/week; sometimes more"  (07/09/2015)  . History of kidney stones 1960'S  . Hyperlipidemia   . Hypothyroidism   . Left trigger finger   . Lung cancer ([place]) 1995   s/p left pneumonectomy in 1995; also treated with radiation  . Normal echocardiogram 08/2011  . Normal nuclear stress test 08/2011   EF was slightly down but normal by echo; no ischemia or scar noted.   Marland Kitchen NSVT (nonsustained ventricular tachycardia) ([place])   . PAF (paroxysmal atrial fibrillation) ([place]) 10 YRS AGO NOT CURRENT PROBLEM   s/p ablation at [facility]  . Type II diabetes mellitus [facility])     Patient Active Problem List   Diagnosis Date Noted  . A-fib ([place]) 05/29/2019  . S/P right inguinal hernia repair 11/11/2017  . Chest pain with low risk for cardiac etiology 07/10/2015  . Type 2 diabetes mellitus with hyperglycemia ([place]) 07/09/2015  . History of pericarditis 07/09/2015  . Hypothyroidism 07/09/2015  . Essential hypertension 07/09/2015  . ARF (acute renal failure) ([place]) 07/09/2015  . Pain in the chest   . PAF (paroxysmal atrial fibrillation) ([facility])   . Acute idiopathic pericarditis   . Chest pain 09/21/2011  . NSVT (nonsustained ventricular tachycardia) ([place]) 09/21/2011    Past Surgical History:  Procedure Laterality Date  . ATRIAL FIBRILLATION ABLATION  early 2000s   "@ [facility]"  . BACK SURGERY    . CARDIAC CATHETERIZATION N/A 07/09/2015  Procedure: Left Heart Cath and Coronary Angiography;  Surgeon: Belva Crome, MD;  Location: [facility] CV LAB;  Service: Cardiovascular;  Laterality: N/A;  . CATARACT EXTRACTION W/ INTRAOCULAR LENS  IMPLANT, BILATERAL Bilateral   . INGUINAL HERNIA REPAIR Left 2000  . INGUINAL HERNIA REPAIR Right 11/11/2017   Procedure: RIGHT INGUINAL HERNIA REPAIR WITH MESH;  Surgeon: Coralie Keens, MD;  Location: [facility] OR[facility];  Service: General;  Laterality: Right;  . INSERTION OF MESH Right 11/11/2017   Procedure: INSERTION OF MESH;  Surgeon: Coralie Keens, MD;  Location: [facility] OR[facility];  Service: General;  Laterality: Right;    . LEFT LUNG REMOVED      DR Arlyce Dice LUNG CANCER  . LUMBAR MICRODISCECTOMY  1984   "where sciatic nerve goes thru"  . PNEUMONECTOMY Left 1995   lung cancer  . SKIN CANCER AREAS REMOVED FROM EAR AND CHEST         Family History  Problem Relation Age of Onset  . Stroke Mother   . Heart failure Father   . Heart failure Other   . Stroke Other   . Cancer Other   . Emphysema Other   . Hypertension Other   . Diabetes Other   . Coronary artery disease Other   . Cancer - Colon Brother   . Stroke Brother   . Healthy Daughter   . Healthy Son     Social History   Tobacco Use  . Smoking status: Former Smoker    Packs/day: 1.00    Years: 40.00    Pack years: 40.00    Types: Cigarettes    Quit date: 07/23/1993    Years since quitting: 25.8  . Smokeless tobacco: Former Systems developer    Types: Chew  . Tobacco comment: "chewed when I was real young"  Substance Use Topics  . Alcohol use: Not Currently  . Drug use: No    Home Medications Prior to Admission medications   Medication Sig Start Date End Date Taking? Authorizing Provider  acetaminophen (TYLENOL) 500 MG tablet Take 500 mg by mouth every 6 (six) hours as needed for moderate pain or headache.    [provider]  baclofen (LIORESAL) 10 MG tablet Take 10 mg by mouth 3 (three) times daily. PRN    [provider]  budesonide-formoterol (SYMBICORT) 160-4.5 MCG/ACT inhaler Inhale 2 puffs into the lungs 2 (two) times daily as needed (for shortness of breath).    [provider]  cholecalciferol (VITAMIN D3) 25 MCG (1000 UT) tablet Take 1,000 Units by mouth daily.    [provider]  Cyanocobalamin (VITAMIN B 12 PO) Take 1 tablet by mouth daily.    [provider]  esomeprazole (NEXIUM) 40 MG capsule Take 40 mg by mouth daily at 12 noon.    [provider]  glimepiride (AMARYL) 2 MG tablet Take 2 mg by mouth daily. 06/07/18   [provider]  levothyroxine (SYNTHROID) 100 MCG  tablet Take 100 mcg by mouth daily before breakfast.    [provider]  losartan (COZAAR) 50 MG tablet Take 25 mg by mouth daily.  06/01/11   [provider]  lubiprostone (AMITIZA) 8 MCG capsule Take 8 mcg by mouth 2 (two) times daily with a meal. PRN    [provider]  metoprolol (LOPRESSOR) 50 MG tablet Take 50 mg by mouth daily. 06/12/13   [provider]  nitroGLYCERIN (NITROSTAT) 0.4 MG SL tablet Place 1 tablet (0.4 mg total) under the tongue every 5 (  five) minutes as needed for chest pain. 09/06/11 03/20/19  Richardson Dopp T, PA-C  ONE TOUCH ULTRA TEST test strip  04/11/18   [provider]  OVER THE COUNTER MEDICATION Place 2 drops into both ears daily as needed (for ear aches). [place] Ear Ache Drops    [provider]  traMADol (ULTRAM) 50 MG tablet Take 1 tablet (50 mg total) by mouth every 6 (six) hours as needed for moderate pain or severe pain. 11/12/17   Coralie Keens, MD    Allergies    Iodinated diagnostic agents, Doxycycline, and Empagliflozin  Review of Systems   Review of Systems  Respiratory: Positive for shortness of breath.   Cardiovascular: Positive for chest pain and leg swelling.  All other systems reviewed and are negative.   Physical Exam Updated Vital Signs BP 105/72   Pulse (!) 123   Temp 98 F (36.7 C) (Oral)   Resp (!) 27   Ht 5\' 10"  (1.778 m)   Wt 79.8 kg   SpO2 95%   BMI 25.25 kg/m   Physical Exam Vitals and nursing note reviewed.  Constitutional:      General: He is not in acute distress.    Appearance: He is well-developed.     Comments: Appears chronically ill  HENT:     Head: Normocephalic and atraumatic.  Eyes:     Extraocular Movements: Extraocular movements intact.     Conjunctiva/sclera: Conjunctivae normal.     Pupils: Pupils are equal, round, and reactive to light.  Cardiovascular:     Rate and Rhythm: Tachycardia present. Rhythm irregular.     Pulses: Normal pulses.      Comments: Tachycardic between 105 and 115.  Irregular. Pulmonary:     Effort: Pulmonary effort is normal.     Breath sounds: Examination of the left-upper field reveals decreased breath sounds. Examination of the left-middle field reveals decreased breath sounds. Examination of the left-lower field reveals decreased breath sounds. Decreased breath sounds present.       Comments: Speaking in short, 3 word sentences.  Decreased lung sounds on the left lobe. Pleural window is open, draining green purulent fluid. Abdominal:     General: Bowel sounds are normal. There is no distension.     Palpations: Abdomen is soft.     Tenderness: There is no abdominal tenderness.  Musculoskeletal:        General: Normal range of motion.     Cervical back: Normal range of motion and neck supple.     Right lower leg: Edema present.     Left lower leg: Edema present.     Comments: 2-3+ pitting edema bilaterally extending to the knees.  Pedal pulses intact.  Skin:    General: Skin is warm and dry.     Capillary Refill: Capillary refill takes less than 2 seconds.  Neurological:     Mental Status: He is alert and oriented to person, place, and time.          ED Results / Procedures / Treatments   Labs (all labs ordered are listed, but only abnormal results are displayed) Labs Reviewed  BASIC METABOLIC PANEL - Abnormal; Notable for the following components:      Result Value   Sodium 129 (*)    Chloride 92 (*)    Glucose, Bld 177 (*)    Creatinine, Ser 1.31 (*)    Calcium 8.4 (*)    GFR calc non Af Amer 53 (*)    All  other components within normal limits  CBC - Abnormal; Notable for the following components:   RBC 3.48 (*)    Hemoglobin 10.0 (*)    HCT 32.2 (*)    All other components within normal limits  BRAIN NATRIURETIC PEPTIDE - Abnormal; Notable for the following components:   B Natriuretic Peptide 175.6 (*)    All other components within normal limits  HEPATIC FUNCTION PANEL -  Abnormal; Notable for the following components:   Albumin 1.9 (*)    All other components within normal limits  LACTIC ACID, PLASMA - Abnormal; Notable for the following components:   Lactic Acid, Venous 2.7 (*)    All other components within normal limits  AEROBIC/ANAEROBIC CULTURE (SURGICAL/DEEP WOUND)  SARS CORONAVIRUS 2 (TAT 6-24 HRS)  CULTURE, BLOOD (ROUTINE X 2)  CULTURE, BLOOD (ROUTINE X 2)  LACTIC ACID, PLASMA  BASIC METABOLIC PANEL  CBC  TROPONIN I (HIGH SENSITIVITY)  TROPONIN I (HIGH SENSITIVITY)    EKG EKG Interpretation  Date/Time:  Friday May 26 2019 11:17:59 EST Ventricular Rate:  108 PR Interval:    QRS Duration: 76 QT Interval:  306 QTC Calculation: 410 R Axis:   19 Text Interpretation: Atrial fibrillation with rapid ventricular response Minimal voltage criteria for LVH, may be normal variant ( R in aVL ) Possible Inferior infarct , age undetermined Abnormal ECG When compared to priorl new afib with RVR.  more wandering baseline. no STEMI Confirmed by Antony Blackbird [phone]) on 06/14/2019 12:02:47 PM   Radiology DG Chest 2 View  Result Date: 06/16/2019 CLINICAL DATA:  Chest pain. Status post lobectomy. EXAM: CHEST - 2 VIEW COMPARISON:  January 16, 2019. FINDINGS: Stable cardiomediastinal silhouette. No pneumothorax is noted. Right lung is clear. Postsurgical changes are noted in the left lung base. Left basilar opacity is noted concerning for pneumonia or atelectasis with associated effusion. Bony thorax is unremarkable. IMPRESSION: Postsurgical changes are noted in left lung base. Left basilar opacity is noted concerning for pneumonia or atelectasis with associated effusion. Electronically Signed   By: Marijo Conception M.D.   On: 06/22/2019 11:48   CT Chest Wo Contrast  Result Date: 06/04/2019 CLINICAL DATA:  Left pneumonectomy 25 years ago secondary to cancer. Exploratory surgery 5 weeks ago at outside hospital secondary to fistula between the bronchus and  esophagus. Substernal chest pain. Shortness of breath. Increased drainage. EXAM: CT CHEST WITHOUT CONTRAST TECHNIQUE: Multidetector CT imaging of the chest was performed following the standard protocol without IV contrast. COMPARISON:  Chest radiograph earlier today.  03/09/2019 chest CT. FINDINGS: Cardiovascular: Aortic and branch vessel atherosclerosis. Tortuous thoracic aorta. Normal heart size, without pericardial effusion. Multivessel coronary artery atherosclerosis. Mediastinum/Nodes: No supraclavicular adenopathy. No mediastinal or definite hilar adenopathy, given limitations of unenhanced CT. Normal appearance of the esophagus, without communication to the bronchus or pneumonectomy bed identified. Lungs/Pleura: Small right pleural fluid is new since the prior CT. Calcified right upper lobe granuloma. Posterior right upper lobe mild airspace and ground-glass opacity is new, including on 64/6. Subpleural right lower lobe 4 mm pulmonary nodule on 90/6 is new since the prior, most likely a subpleural lymph node. Mild centrilobular emphysema. Left pneumonectomy. Interval evacuation of the fluid within the pneumonectomy bed. There is gas and presumed packing material within the operative bed. Presumed postsurgical communication to the skin surface superiorly on 37/3 and more inferiorly and laterally on 47/3. Upper Abdomen: Mild hepatic steatosis. Normal imaged portions of the spleen, stomach, pancreas, gallbladder, adrenal glands, left kidney. Musculoskeletal: Postsurgical left  rib defects. Moderate mid and lower thoracic spondylosis. IMPRESSION: 1. Since 03/09/2019, drainage of the left pneumonectomy bed fluid, with placement of presumed surgical packing material. Postsurgical communication of the operative bed to the posterolateral left chest wall. 2. New small right pleural effusion with inferior right upper lobe airspace disease, suspicious for mild infection or aspiration. 3. Aortic atherosclerosis  (ICD10-I70.0), coronary artery atherosclerosis and emphysema (ICD10-J43.9). 4. Hepatic steatosis. Electronically Signed   By: Abigail Miyamoto M.D.   On: 06/20/2019 14:51    Procedures Procedures (including critical care time)  Medications Ordered in ED Medications  sodium chloride flush (NS) 0.9 % injection 3 mL (3 mLs Intravenous Not Given 06/01/2019 1240)  vancomycin (VANCOREADY) IVPB 1250 mg/250 mL (has no administration in time range)  ceFEPIme (MAXIPIME) 2 g in sodium chloride 0.9 % 100 mL IVPB (0 g Intravenous Stopped 06/02/2019 1737)  metoprolol tartrate (LOPRESSOR) tablet 100 mg (has no administration in time range)  amiodarone (PACERONE) tablet 200 mg (has no administration in time range)  diltiazem (CARDIZEM) tablet 120 mg (has no administration in time range)  traMADol (ULTRAM) tablet 50 mg (has no administration in time range)  levothyroxine (SYNTHROID) tablet 100 mcg (has no administration in time range)  pantoprazole (PROTONIX) EC tablet 40 mg (has no administration in time range)  lubiprostone (AMITIZA) capsule 8 mcg (has no administration in time range)  baclofen (LIORESAL) tablet 10 mg (has no administration in time range)  mometasone-formoterol (DULERA) 200-5 MCG/ACT inhaler 2 puff (has no administration in time range)  nitroGLYCERIN (NITROSTAT) SL tablet 0.4 mg (has no administration in time range)  0.9 %  sodium chloride infusion (has no administration in time range)  acetaminophen (TYLENOL) tablet 500 mg (has no administration in time range)  apixaban (ELIQUIS) tablet 5 mg (has no administration in time range)  insulin glargine (LANTUS) injection 12 Units (has no administration in time range)  insulin aspart (novoLOG) injection 0-9 Units (has no administration in time range)  metroNIDAZOLE (FLAGYL) IVPB 500 mg (0 mg Intravenous Stopped 05/28/2019 1738)  vancomycin (VANCOREADY) IVPB 1750 mg/350 mL (1,750 mg Intravenous New Bag/Given 06/23/2019 1738)  acetaminophen (TYLENOL) tablet 650 mg  (650 mg Oral Given 06/24/2019 1616)    ED Course  I have reviewed the triage vital signs and the nursing notes.  Pertinent labs & imaging results that were available during my care of the patient were reviewed by me and considered in my medical decision making (see chart for details).    MDM Rules/Calculators/A&P                      Patient presenting for evaluation of chest pain and shortness of breath.  Physical exam shows chronically ill male.  He is in A. fib with mild tachycardia up to 115-120.  Diminished lung sounds in the left side, and significant recent pulmonary history including thoracotomy with pleural window a month ago.  He has purulent foul-smelling green drainage from his thorax, concern for infection.  Lower suspicion for ACS in the setting of concerning pulmonary findings, however will obtain EKG and troponin.  X-ray and labs ordered for further evaluation.  Additionally patient with bilateral peripheral edema, consider CHF exacerbation and fluid overload.  X-ray viewed and interpreted by me, there is hazy opacity at the left lung.  This is difficult to define due to his recent surgery, so will obtain CT chest for further evaluation.  EKG shows A. fib with RVR.  Labs show slight anemia at 10,  likely due to chronic disease.  Sodium potassium slightly low, likely dehydrated.  Will obtain BNP evaluation of heart failure.  Initial troponin normal at 12.  Will add lactic and blood cultures due to concern for pneumonia.  Will hold on treatment for A. fib as heart rate is only slightly elevated, blood pressure stable, and this is likely being driven by underlying infection.  CT shows concern for aspiration/pneumonia.  Initial lactic mildly elevated at 2.7.  BNP mildly elevated 175.  As patient has a developing pneumonia while on linezolid, he will likely need to be admitted for IV antibiotics.  Will obtain cultures of the fluid coming from his chest prior to antibiotic initiation per  pharmacy recommendation.  Antibiotics per pharmacy.  Discussed with patient, who is agreeable to plan.  He would like to stay at [facility] if possible as opposed to being transferred to [facility] where the most recent procedure was done.  Will call for admission.  Discussed with Dr. Roosevelt Locks from triad hospitalist service, patient to be admitted.  Final Clinical Impression(s) / ED Diagnoses Final diagnoses:  Community acquired pneumonia of left lung, unspecified part of lung    Rx / DC Orders ED Discharge Orders    None       Franchot Heidelberg, PA-C 06/20/2019 2013    Tegeler, Gwenyth Allegra, MD 06/24/2019 2215

## 2019-05-26 NOTE — ED Notes (Signed)
Patient transported to CT 

## 2019-05-27 ENCOUNTER — Inpatient Hospital Stay (HOSPITAL_COMMUNITY): Payer: PPO

## 2019-05-27 ENCOUNTER — Inpatient Hospital Stay (HOSPITAL_COMMUNITY): Payer: PPO | Admitting: Certified Registered"

## 2019-05-27 DIAGNOSIS — I480 Paroxysmal atrial fibrillation: Secondary | ICD-10-CM

## 2019-05-27 DIAGNOSIS — A419 Sepsis, unspecified organism: Secondary | ICD-10-CM

## 2019-05-27 DIAGNOSIS — J189 Pneumonia, unspecified organism: Secondary | ICD-10-CM

## 2019-05-27 DIAGNOSIS — J860 Pyothorax with fistula: Secondary | ICD-10-CM

## 2019-05-27 DIAGNOSIS — J869 Pyothorax without fistula: Secondary | ICD-10-CM

## 2019-05-27 DIAGNOSIS — R0602 Shortness of breath: Secondary | ICD-10-CM

## 2019-05-27 DIAGNOSIS — Z978 Presence of other specified devices: Secondary | ICD-10-CM

## 2019-05-27 DIAGNOSIS — J9601 Acute respiratory failure with hypoxia: Secondary | ICD-10-CM

## 2019-05-27 DIAGNOSIS — R570 Cardiogenic shock: Secondary | ICD-10-CM

## 2019-05-27 DIAGNOSIS — I4819 Other persistent atrial fibrillation: Secondary | ICD-10-CM

## 2019-05-27 DIAGNOSIS — I469 Cardiac arrest, cause unspecified: Secondary | ICD-10-CM

## 2019-05-27 DIAGNOSIS — R001 Bradycardia, unspecified: Secondary | ICD-10-CM

## 2019-05-27 DIAGNOSIS — R6521 Severe sepsis with septic shock: Secondary | ICD-10-CM

## 2019-05-27 DIAGNOSIS — E872 Acidosis: Secondary | ICD-10-CM

## 2019-05-27 LAB — BLOOD GAS, ARTERIAL
Acid-base deficit: 7.3 mmol/L — ABNORMAL HIGH (ref 0.0–2.0)
Bicarbonate: 17.2 mmol/L — ABNORMAL LOW (ref 20.0–28.0)
FIO2: 21
O2 Saturation: 99.1 %
Patient temperature: 36.2
pCO2 arterial: 30.7 mmHg — ABNORMAL LOW (ref 32.0–48.0)
pH, Arterial: 7.362 (ref 7.350–7.450)
pO2, Arterial: 138 mmHg — ABNORMAL HIGH (ref 83.0–108.0)

## 2019-05-27 LAB — POCT I-STAT 7, (LYTES, BLD GAS, ICA,H+H)
Acid-base deficit: 7 mmol/L — ABNORMAL HIGH (ref 0.0–2.0)
Bicarbonate: 20.6 mmol/L (ref 20.0–28.0)
Calcium, Ion: 1.21 mmol/L (ref 1.15–1.40)
HCT: 29 % — ABNORMAL LOW (ref 39.0–52.0)
Hemoglobin: 9.9 g/dL — ABNORMAL LOW (ref 13.0–17.0)
O2 Saturation: 100 %
Patient temperature: 97.7
Potassium: 5.1 mmol/L (ref 3.5–5.1)
Sodium: 129 mmol/L — ABNORMAL LOW (ref 135–145)
TCO2: 22 mmol/L (ref 22–32)
pCO2 arterial: 47.9 mmHg (ref 32.0–48.0)
pH, Arterial: 7.24 — ABNORMAL LOW (ref 7.350–7.450)
pO2, Arterial: 419 mmHg — ABNORMAL HIGH (ref 83.0–108.0)

## 2019-05-27 LAB — GLUCOSE, CAPILLARY
Glucose-Capillary: 104 mg/dL — ABNORMAL HIGH (ref 70–99)
Glucose-Capillary: 115 mg/dL — ABNORMAL HIGH (ref 70–99)
Glucose-Capillary: 129 mg/dL — ABNORMAL HIGH (ref 70–99)
Glucose-Capillary: 148 mg/dL — ABNORMAL HIGH (ref 70–99)
Glucose-Capillary: 54 mg/dL — ABNORMAL LOW (ref 70–99)
Glucose-Capillary: 75 mg/dL (ref 70–99)
Glucose-Capillary: 86 mg/dL (ref 70–99)
Glucose-Capillary: 87 mg/dL (ref 70–99)

## 2019-05-27 LAB — TROPONIN I (HIGH SENSITIVITY)
Troponin I (High Sensitivity): 17 ng/L (ref <18)
Troponin I (High Sensitivity): 47 ng/L — ABNORMAL HIGH (ref <18)

## 2019-05-27 LAB — LACTIC ACID, PLASMA
Lactic Acid, Venous: 8.1 mmol/L (ref 0.5–1.9)
Lactic Acid, Venous: 8.2 mmol/L (ref 0.5–1.9)
Lactic Acid, Venous: 6.2 mmol/L (ref 0.5–1.9)
Lactic Acid, Venous: 4.8 mmol/L (ref 0.5–1.9)

## 2019-05-27 LAB — COMPREHENSIVE METABOLIC PANEL
ALT: 1576 U/L — ABNORMAL HIGH (ref 0–44)
AST: 3578 U/L — ABNORMAL HIGH (ref 15–41)
Albumin: 1.7 g/dL — ABNORMAL LOW (ref 3.5–5.0)
Alkaline Phosphatase: 189 U/L — ABNORMAL HIGH (ref 38–126)
Anion gap: 17 — ABNORMAL HIGH (ref 5–15)
BUN: 28 mg/dL — ABNORMAL HIGH (ref 8–23)
CO2: 18 mmol/L — ABNORMAL LOW (ref 22–32)
Calcium: 7.9 mg/dL — ABNORMAL LOW (ref 8.9–10.3)
Chloride: 97 mmol/L — ABNORMAL LOW (ref 98–111)
Creatinine, Ser: 2.27 mg/dL — ABNORMAL HIGH (ref 0.61–1.24)
GFR calc Af Amer: 31 mL/min — ABNORMAL LOW (ref >60)
GFR calc non Af Amer: 27 mL/min — ABNORMAL LOW (ref >60)
Glucose, Bld: 152 mg/dL — ABNORMAL HIGH (ref 70–99)
Potassium: 5.5 mmol/L — ABNORMAL HIGH (ref 3.5–5.1)
Sodium: 132 mmol/L — ABNORMAL LOW (ref 135–145)
Total Bilirubin: 1 mg/dL (ref 0.3–1.2)
Total Protein: 5.9 g/dL — ABNORMAL LOW (ref 6.5–8.1)

## 2019-05-27 LAB — MAGNESIUM
Magnesium: 2.2 mg/dL (ref 1.7–2.4)
Magnesium: 1.9 mg/dL (ref 1.7–2.4)

## 2019-05-27 LAB — BASIC METABOLIC PANEL
Anion gap: 16 — ABNORMAL HIGH (ref 5–15)
BUN: 19 mg/dL (ref 8–23)
CO2: 18 mmol/L — ABNORMAL LOW (ref 22–32)
Calcium: 7.7 mg/dL — ABNORMAL LOW (ref 8.9–10.3)
Chloride: 93 mmol/L — ABNORMAL LOW (ref 98–111)
Creatinine, Ser: 1.52 mg/dL — ABNORMAL HIGH (ref 0.61–1.24)
GFR calc Af Amer: 51 mL/min — ABNORMAL LOW (ref >60)
GFR calc non Af Amer: 44 mL/min — ABNORMAL LOW (ref >60)
Glucose, Bld: 209 mg/dL — ABNORMAL HIGH (ref 70–99)
Potassium: 5.7 mmol/L — ABNORMAL HIGH (ref 3.5–5.1)
Sodium: 127 mmol/L — ABNORMAL LOW (ref 135–145)

## 2019-05-27 LAB — CBC
HCT: 31.1 % — ABNORMAL LOW (ref 39.0–52.0)
Hemoglobin: 9.3 g/dL — ABNORMAL LOW (ref 13.0–17.0)
MCH: 28.7 pg (ref 26.0–34.0)
MCHC: 29.9 g/dL — ABNORMAL LOW (ref 30.0–36.0)
MCV: 96 fL (ref 80.0–100.0)
Platelets: 348 10*3/uL (ref 150–400)
RBC: 3.24 MIL/uL — ABNORMAL LOW (ref 4.22–5.81)
RDW: 14.6 % (ref 11.5–15.5)
WBC: 9.8 10*3/uL (ref 4.0–10.5)
nRBC: 0.4 % — ABNORMAL HIGH (ref 0.0–0.2)

## 2019-05-27 LAB — PHOSPHORUS: Phosphorus: 4.8 mg/dL — ABNORMAL HIGH (ref 2.5–4.6)

## 2019-05-27 LAB — BRAIN NATRIURETIC PEPTIDE: B Natriuretic Peptide: 335.1 pg/mL — ABNORMAL HIGH (ref 0.0–100.0)

## 2019-05-27 LAB — TSH: TSH: 4.465 u[IU]/mL (ref 0.350–4.500)

## 2019-05-27 MED ORDER — GLUCAGON HCL RDNA (DIAGNOSTIC) 1 MG IJ SOLR
1.0000 mg | Freq: Once | INTRAMUSCULAR | Status: AC
  Administered 2019-05-27: 1 mg via INTRAVENOUS

## 2019-05-27 MED ORDER — NOREPINEPHRINE 4 MG/250ML-% IV SOLN
0.0000 ug/min | INTRAVENOUS | Status: DC
  Filled 2019-05-27: qty 250

## 2019-05-27 MED ORDER — CHLORHEXIDINE GLUCONATE CLOTH 2 % EX PADS
6.0000 | MEDICATED_PAD | Freq: Every day | CUTANEOUS | Status: DC
  Administered 2019-05-27 – 2019-05-30 (×3): 6 via TOPICAL

## 2019-05-27 MED ORDER — FENTANYL BOLUS VIA INFUSION
25.0000 ug | INTRAVENOUS | Status: DC | PRN
  Filled 2019-05-27: qty 25

## 2019-05-27 MED ORDER — DOPAMINE-DEXTROSE 3.2-5 MG/ML-% IV SOLN
0.0000 ug/kg/min | INTRAVENOUS | Status: DC
  Filled 2019-05-27: qty 250

## 2019-05-27 MED ORDER — AMIODARONE HCL IN DEXTROSE 360-4.14 MG/200ML-% IV SOLN
30.0000 mg/h | INTRAVENOUS | Status: DC
  Filled 2019-05-27: qty 200

## 2019-05-27 MED ORDER — FENTANYL 2500MCG IN NS 250ML (10MCG/ML) PREMIX INFUSION
25.0000 ug/h | INTRAVENOUS | Status: DC
  Administered 2019-05-27: 25 ug/h via INTRAVENOUS
  Administered 2019-05-28: 100 ug/h via INTRAVENOUS
  Administered 2019-05-29: 150 ug/h via INTRAVENOUS
  Filled 2019-05-27 (×3): qty 250

## 2019-05-27 MED ORDER — ORAL CARE MOUTH RINSE
15.0000 mL | OROMUCOSAL | Status: DC
  Administered 2019-05-27 – 2019-05-30 (×32): 15 mL via OROMUCOSAL

## 2019-05-27 MED ORDER — AMIODARONE LOAD VIA INFUSION
150.0000 mg | Freq: Once | INTRAVENOUS | Status: AC
  Administered 2019-05-28: 150 mg via INTRAVENOUS
  Filled 2019-05-27: qty 83.34

## 2019-05-27 MED ORDER — SODIUM CHLORIDE 0.9 % IV BOLUS
500.0000 mL | Freq: Once | INTRAVENOUS | Status: AC
  Administered 2019-05-27: 500 mL via INTRAVENOUS

## 2019-05-27 MED ORDER — SODIUM CHLORIDE 0.9 % IV BOLUS: 500.0000 mL | Freq: Once | INTRAVENOUS | Status: DC

## 2019-05-27 MED ORDER — AMIODARONE HCL IN DEXTROSE 360-4.14 MG/200ML-% IV SOLN: 60.0000 mg/h | INTRAVENOUS | Status: DC

## 2019-05-27 MED ORDER — SODIUM CHLORIDE 0.9 % IV BOLUS
1000.0000 mL | Freq: Once | INTRAVENOUS | Status: AC
  Administered 2019-05-27: 1000 mL via INTRAVENOUS

## 2019-05-27 MED ORDER — DEXTROSE 50 % IV SOLN
INTRAVENOUS | Status: AC
  Filled 2019-05-27: qty 50

## 2019-05-27 MED ORDER — CHLORHEXIDINE GLUCONATE CLOTH 2 % EX PADS
6.0000 | MEDICATED_PAD | Freq: Every day | CUTANEOUS | Status: DC
  Administered 2019-05-27 – 2019-05-30 (×4): 6 via TOPICAL

## 2019-05-27 MED ORDER — METOCLOPRAMIDE HCL 5 MG/ML IJ SOLN
10.0000 mg | Freq: Once | INTRAMUSCULAR | Status: AC
  Administered 2019-05-27: 10 mg via INTRAVENOUS
  Filled 2019-05-27: qty 2

## 2019-05-27 MED ORDER — FENTANYL CITRATE (PF) 100 MCG/2ML IJ SOLN
INTRAMUSCULAR | Status: AC
  Administered 2019-05-27: 25 ug via INTRAVENOUS
  Filled 2019-05-27: qty 2

## 2019-05-27 MED ORDER — EPINEPHRINE HCL 5 MG/250ML IV SOLN IN NS
0.5000 ug/min | INTRAVENOUS | Status: DC
Start: 2019-05-27 — End: 2019-05-27
  Administered 2019-05-27: 9 ug/min via INTRAVENOUS
  Administered 2019-05-27: 7 ug/min via INTRAVENOUS
  Filled 2019-05-27 (×2): qty 250

## 2019-05-27 MED ORDER — SODIUM BICARBONATE 8.4 % IV SOLN: 50.0000 meq | Freq: Once | INTRAVENOUS | Status: DC

## 2019-05-27 MED ORDER — CHLORHEXIDINE GLUCONATE 0.12% ORAL RINSE (MEDLINE KIT)
15.0000 mL | Freq: Two times a day (BID) | OROMUCOSAL | Status: DC
  Administered 2019-05-27 – 2019-05-30 (×7): 15 mL via OROMUCOSAL

## 2019-05-27 MED ORDER — CALCIUM GLUCONATE-NACL 2-0.675 GM/100ML-% IV SOLN
2.0000 g | Freq: Once | INTRAVENOUS | Status: DC
  Filled 2019-05-27: qty 100

## 2019-05-27 MED ORDER — NOREPINEPHRINE 4 MG/250ML-% IV SOLN
2.0000 ug/min | INTRAVENOUS | Status: DC
  Administered 2019-05-27 – 2019-05-28 (×2): 2 ug/min via INTRAVENOUS
  Administered 2019-05-29: 10 ug/min via INTRAVENOUS
  Administered 2019-05-29: 9 ug/min via INTRAVENOUS
  Filled 2019-05-27 (×5): qty 250

## 2019-05-27 MED ORDER — FENTANYL CITRATE (PF) 100 MCG/2ML IJ SOLN: 25.0000 ug | Freq: Once | INTRAMUSCULAR | Status: AC

## 2019-05-27 MED ORDER — DEXTROSE 50 % IV SOLN
1.0000 | Freq: Once | INTRAVENOUS | Status: AC
  Administered 2019-05-27: 50 mL via INTRAVENOUS

## 2019-05-27 MED ORDER — GLUCAGON HCL RDNA (DIAGNOSTIC) 1 MG IJ SOLR
1.0000 mg | Freq: Once | INTRAMUSCULAR | Status: DC | PRN
  Filled 2019-05-27 (×2): qty 1

## 2019-05-27 MED ORDER — SODIUM CHLORIDE 0.9 % IV SOLN
250.0000 mL | INTRAVENOUS | Status: DC
  Administered 2019-05-27: 250 mL via INTRAVENOUS

## 2019-05-27 MED ORDER — SODIUM CHLORIDE 0.9 % IV SOLN: 1.0000 g | Freq: Once | INTRAVENOUS | Status: DC

## 2019-05-27 MED ORDER — SODIUM CHLORIDE 0.9 % IV SOLN
2.0000 g | Freq: Two times a day (BID) | INTRAVENOUS | Status: DC
  Administered 2019-05-28: 2 g via INTRAVENOUS
  Filled 2019-05-27 (×3): qty 2

## 2019-05-27 MED FILL — Medication: Qty: 1 | Status: AC

## 2019-05-27 NOTE — Progress Notes (Signed)
[facility] Progress Note Patient Name: Zachary Bean DOB: [birth year] MRN: [id number]   Date of Service  05/27/2019  HPI/Events of Note  Lactic Acid = 8.1.   eICU Interventions  Will order: 1. Bolus with 0.9 NaCl 1 liter IV over 1 hour now.  2. Continue to trend Lactic Acid.      Intervention Category Major Interventions: Acid-Base disturbance - evaluation and management  Shantae Vantol Eugene 05/27/2019, 6:23 AM

## 2019-05-27 NOTE — Progress Notes (Addendum)
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 During this recent hospitalization at [facility], course was also complicated by refractory AFRVR requiring cardioversion, LAA thrombus when off anticoagulation for BPF management and MSSA skin infection surrounding the pleural window. Since discharge, home health has managed his wound with dressing changes MWF.  He was scheduled for surgical follow-up in 2-3 weeks after discharge (05/20/19).  He was admitted to [facility] for sepsis secondary to pneumonia. PCCM was consulted for post-cardiac arrest on 1/2.  S: Remains on mechanical ventilation and vaso pressor support post-arrest. Weaned off Epi after switching to Levo  Blood pressure (!) 131/47, pulse 85, temperature 98.2 F (36.8 C), temperature source Oral, resp. rate 15, height 5\' 10"  (1.778 m), weight 79.8 kg, SpO2 100 %.  On my exam, chronically ill appearing male on mechanical ventilation on vasopressor support. Cardiac exam with RRR, no murmurs. JVD noted. Diminished breath sounds bilaterally. 2+ pitting edema in lower extremities. Left chest wound dressing place.  CBC    Component Value Date/Time   WBC 9.8 05/27/2019 0147   RBC 3.24 (L) 05/27/2019 0147   HGB 9.9 (L) 05/27/2019 0526   HCT 29.0 (L) 05/27/2019 0526   PLT 348 05/27/2019 0147   MCV 96.0 05/27/2019 0147   MCH 28.7 05/27/2019 0147   MCHC 29.9 (L) 05/27/2019 0147   RDW 14.6 05/27/2019 0147   LYMPHSABS 1.7 01/16/2019 0842   MONOABS 0.6 01/16/2019 0842   EOSABS 0.5 01/16/2019 0842   BASOSABS 0.1 01/16/2019 0842   CMP Latest Ref Rng & Units 05/27/2019 05/27/2019 05/27/2019  Glucose 70 - 99 mg/dL 152(H) - 209(H)  BUN 8 - 23 mg/dL 28(H) - 19  Creatinine 0.61 - 1.24 mg/dL 2.27(H) - 1.52(H)  Sodium 135 - 145 mmol/L  132(L) 129(L) 127(L)  Potassium 3.5 - 5.1 mmol/L 5.5(H) 5.1 5.7(H)  Chloride 98 - 111 mmol/L 97(L) - 93(L)  CO2 22 - 32 mmol/L 18(L) - 18(L)  Calcium 8.9 - 10.3 mg/dL 7.9(L) - 7.7(L)  Total Protein 6.5 - 8.1 g/dL 5.9(L) - -  Total Bilirubin 0.3 - 1.2 mg/dL 1.0 - -  Alkaline Phos 38 - 126 U/L 189(H) - -  AST 15 - 41 U/L 3,578(H) - -  ALT 0 - 44 U/L 1,576(H) - -    Assessment/Plan  PEA arrest: Etiology unclear however after discussing case with Cardiology, likely related to hypoxemia. On chart review, patient had dyspnea preceding event which was then followed by bradycardia. Though no saturations were documented, patient with active pneumonia with new O2 requirement would explain patient's arrest as bradycardia alone would not sufficient as the inciting event for cardiac arrest. --Supportive care as noted below --Echocardiogram --Trend troponin  Cardiogenic shock vs septic shock secondary to pneumonia --Wean levophed for goal MAP >65 --Broad spectrum antibiotics --Trend LA --Follow-up cultures  Acute hypoxemic respiratory failure --Full vent support --Wean PEEP/FIO2 for goal SpO2 >88%  Atrial fibrillation with left atrial thrombus --Amiodarone per Cardiology --Continue Eliquis  Per [facility] wound recommendations: Site: Left pleural window surgical wound: Staples to remain in place until seen in clinic (2-3 weeks after discharge). Pack wound every Monday, Wednesday, Friday with 2 kerlex tied together (to ensure they are able to be removed), Soaked in Wright solution (moderately soaked), packed into site and covered ABD. Periwound skin  care per WOC:L flank periwound care 1. Remove dressing with Adhesive Releaser spray (SAP # [id number]) 2. "Crust" skin by lightly dusting with Nystatin Powder (Pharmacy) and spraying with No Sting barrier film (SAP # [id number]). 3. Top with hydrophilic foam (4x4 - SAP # [id number], 4x8 - SAP # [id number]) 4. Secure with Medipore tape (SAP # [id number]).  The patient is  critically ill with multiple organ systems failure and requires high complexity decision making for assessment and support, frequent evaluation and titration of therapies, application of advanced monitoring technologies and extensive interpretation of multiple databases.   Critical Care Time devoted to patient care services described in this note is 45 Minutes. This time reflects time of care of this signee Dr. Rodman Pickle. This critical care time does not reflect procedure time, or teaching time or supervisory time of PA/NP/Med student/Med Resident etc but could involve care discussion time.  Rodman Pickle, M.D. [facility] Pulmonary/Critical Care Medicine 05/27/2019 5:50 PM   Please see Amion for pager number to reach on-call Pulmonary and Critical Care Team.

## 2019-05-27 NOTE — Progress Notes (Signed)
CRITICAL VALUE ALERT  Critical Value:  Troponin 47  Date & Time Notied: 05/27/19 1935  Provider Notified: Dr. Loanne Drilling   Orders Received/Actions taken: See new orders

## 2019-05-27 NOTE — Code Documentation (Signed)
Called d/t pt HR-20s sustained and decreased LOC. When entering room, pt with agonal respirations, pulse present at this time, HR-30. 1 amp atropine given and pt bagged. Unable to palpate pulse at 0323-PEA so compressions began, 1 amp epi given, and code blue called. Pulse palpable at 0327, BP 145/52. Dopamine started at 59mcg at 0332 and pt intubated at 0336 by anesthesia. PCCM at bedside and Epi gtt started at 0343 at 59mcg and dopamine turned off.  PCXR done and pt transported to 2H14 with RN, RT, NT, and PCCM MD.

## 2019-05-27 NOTE — Progress Notes (Addendum)
Called by bedside RN with concerns for new onset bradycardia and worsening SOB. RN states that pt was in his usual state of health until he was given metoprolol, cardizem and tramadol around 2330. After that time, he began to complain of worsening SOB, weakness, and nausea. His HR dropped to the 40's and he became hypotensive. On assessment, he is sitting on the side of the bed in the tripod position. Rhonchi is heard throughout righ lung fields. Decreased LS heard on left side. He continues to be bradycardiac with HR in 30's and hypotensive with SBP in the 60's.   Symptomatic bradycardia/hypotension - Most likely from administering beta blocker, cardizem and pain medication together. It is noted in chart that patient's amiodarone, cardizem, metoprolol are scheduled close together. Reschedule medications.  - 1000 cc NS bolus - EKG - Trop - Consulted cardiology again.  SOB - Chest xray - ABG  Nausea - Reglan  Consulted PCCM regarding pt condition. Pt to be transferred to ICU and started on pressors. Informed 15 minutes after talking to critical care that a code blue was called when pt went PEA. Being transferred to ICU immediately.   Lovey Newcomer, NP [facility] [phone]-7a [phone]  CRITICAL CARE Performed by: Neila Gear   Total critical care time: 45 minutes  Critical care time was exclusive of separately billable procedures and treating other patients.  Critical care was necessary to treat or prevent imminent or life-threatening deterioration.  Critical care was time spent personally by me on the following activities: development of treatment plan with patient and/or surrogate as well as nursing, discussions with consultants, evaluation of patient's response to treatment, examination of patient, obtaining history from patient or surrogate, ordering and performing treatments and interventions, ordering and review of laboratory studies, ordering and review of radiographic  studies, pulse oximetry and re-evaluation of patient's condition.

## 2019-05-27 NOTE — Code Documentation (Signed)
  Patient Name: Zachary Bean   MRN: [id number]   Date of Birth/ Sex: 04-16-1941 , male      Admission Date: 06/22/2019  Attending Provider: Lequita Halt, MD  Primary Diagnosis: <principal problem not specified>   Indication: Pt was in his usual state of health until this PM, when he was noted to be . Code blue was subsequently called. At the time of arrival on scene, ROSC was already obtained.    Technical Description:  - CPR performance duration:  4 minutes  - Was defibrillation or cardioversion used? Yes   - Was external pacer placed? No  - Was patient intubated pre/post CPR? Yes   Medications Administered: Y = Yes; Blank = No Amiodarone    Atropine  x  Calcium    Epinephrine  x  Lidocaine    Magnesium    Norepinephrine    Phenylephrine    Sodium bicarbonate    Vasopressin     Post CPR evaluation:  - Final Status - Was patient successfully resuscitated ? Yes - What is current rhythm? Bradycardia - What is current hemodynamic status? Stable with pressors  Miscellaneous Information:  - Labs sent, including: CMP , lactic acid   - Primary team notified?  Yes  - Family Notified? RN paging primary to notify family   - Additional notes/ transfer status: Transfer to [facility]     Madalyn Rob, MD  05/27/2019, 4:11 AM

## 2019-05-27 NOTE — Consult Note (Signed)
NAME:  Zachary Bean, MRN:  [id number], DOB:  28-Nov-1940, LOS: 1 ADMISSION DATE:  06/08/2019, CONSULTATION DATE:  05/27/19 REFERRING MD:  Kennon Holter, CHIEF COMPLAINT:  bradycardya   Brief History   79 year old male with a history of bronchopleural fistula with pleural window who presented with shortness of breath and palpitations.  History of atrial fibrillation and tachycardic in the ED.  Cardizem and metoprolol were increased and given together, shortly thereafter patient developed hypotension and bradycardia with brief CODE BLUE.  ROSC achieved and patient transferred to the ICU.  History of present illness   79 y.o. M with PMH of lung Ca s/p L pneumonectomy and radiation therapy in 1995 with recent admission to [facility] with Hospital with complicated L bronchopleural fistula and underwent thoracoscopy and bronchoscopy and discharged home with a pleural window on Zyvox for culture from fistula growing MSSA.  He developed a flutter that was difficult to control during this admission and was discharged on Cardizem and metoprolol.  Initial plan was for cardioversion however echocardiogram showed LA thrombosis.  He presented to the emergency department on January 1 with palpitations and shortness of breath with increased drainage and was admitted for possible empyema, CT chest with new right upper lobe airspace disease.  On admission, heart rate was elevated 110s to 130s.  Patient has been on changing doses of calcium channel blockers and beta-blockers, per pharmacy Cardizem CD 360 mg daily was filled 12/27, and Toprol XL 150 mg daily also filled same day.  On admission, Cardizem 120 mg q 8hrs with metoprolol 100 mg 3 times daily was ordered.  Unfortunately, both of these medications were given at the same time with Ultram and several hours later patient developed shortness of breath and bradycardia with hypotension.  He was given 1.5 L of IV fluids.  Around 3 AM heart rate decreased to 20s and patient had decreased  level of consciousness.  He was noted to have agonal respirations.  He was intubated and given atropine.  Developed PEA arrest and so CODE BLUE was called and CPR initiated.  CPR sustained for 4 minutes, given epi x1 and ROSC obtained.  Patient transferred to the intensive care unit.  Past Medical History   has a past medical history of Acute idiopathic pericarditis (2008), Childhood asthma, Chronic kidney disease, Chronic lower back pain, Chronic sinusitis, Duodenal ulcer, chronic, GERD (gastroesophageal reflux disease), Headache, History of kidney stones (1960'S), Hyperlipidemia, Hypothyroidism, Left trigger finger, Lung cancer ([place]) (1995), Normal echocardiogram (08/2011), Normal nuclear stress test (08/2011), NSVT (nonsustained ventricular tachycardia) ([place]), PAF (paroxysmal atrial fibrillation) ([facility]) (10 YRS AGO NOT CURRENT PROBLEM), and Type II diabetes mellitus ([place]).   Significant Hospital Events   1/1 admit to hospitalist 1/2 [place] and transferred to ICU  Consults:  Cardiology PCCM  Procedures:  1/2 ETT  Significant Diagnostic Tests:  1/1 CT chest>>New small right pleural effusion with inferior right upper lobe airspace disease, suspicious for mild infection or aspiration.  Micro Data:  1/1 BCx2>> 1/1 Sars-CoV-2>>neg 1/1 surgical wound culture>>few gm neg rods, rare gm positive cocci, rare gm positive rods>>  Antimicrobials:  Vancomycin 1/1- Cefepime 1/1-  Interim history/subjective:  Pt regained ROSC, started on peripheral Epi gtt  Objective   Blood pressure (!) 79/31, pulse (!) 52, temperature 97.6 F (36.4 C), temperature source Oral, resp. rate (!) 28, height 5\' 10"  (1.778 m), weight 79.8 kg, SpO2 (!) 86 %.    Vent Mode: SIMV/PC/PS FiO2 (%):  [100 %] 100 % Set Rate:  [10  bmp] 10 bmp PEEP:  [5 cmH20] 5 cmH20 Pressure Support:  [14 cmH20] 14 cmH20   Intake/Output Summary (Last 24 hours) at 05/27/2019 0424 Last data filed at 05/27/2019 0033 Gross per 24 hour    Intake 377.41 ml  Output --  Net 377.41 ml   Filed Weights   06/17/2019 1251  Weight: 79.8 kg    General: Elderly male, sedated and intubated HEENT: MM pink/moist Neuro: Nonpurposeful movements, pupils equal and reactive, withdraws to pain  CV: s1s2 bradycardic, no m/r/g PULM: Decreased air movement bilateral bases, left sided surgical wound present and dressed GI: soft, bsx4 active  Extremities: warm/dry, no edema  Skin: no rashes or lesions     Resolved Hospital Problem list   None  Assessment & Plan:  Hypotension, bradycardia, hypoxic respiratory failure secondary to concurrent beta-blockers and calcium channel blocker administration -Given glucagon stat IV with calcium chloride P: -Continue epinephrine infusion to maintain MAP greater than 30, on peripheral dose now but may need central line if increasing doses required -Hold Cardizem and metoprolol -Given 1.5 L IV fluids overnight, hold further hydration for now -Cardiology consulted by hospitalist   Right upper lobe pneumonia, possible left chest surgical wound infection -Blood and surgical wound cultures are pending, continue empiric broad-spectrum vancomycin and cefepime  Best practice:  Diet: N.p.o. Pain/Anxiety/Delirium protocol (if indicated): Fentanyl VAP protocol (if indicated): Yes DVT prophylaxis: Eliquis GI prophylaxis: Protonix Glucose control: Sliding scale insulin Mobility: Bedrest Code Status: Full Family Communication: Per RN hospitalist to call Disposition: ICU  Labs   CBC: Recent Labs  Lab 06/03/2019 1106 05/27/19 0147  WBC 7.4 9.8  HGB 10.0* 9.3*  HCT 32.2* 31.1*  MCV 92.5 96.0  PLT 352 371    Basic Metabolic Panel: Recent Labs  Lab 06/08/2019 1106 05/27/19 0147  NA 129* 127*  K 4.7 5.7*  CL 92* 93*  CO2 27 18*  GLUCOSE 177* 209*  BUN 17 19  CREATININE 1.31* 1.52*  CALCIUM 8.4* 7.7*   GFR: Estimated Creatinine Clearance: 42.7 mL/min (A) (by C-G formula based on SCr of  1.52 mg/dL (H)). Recent Labs  Lab 06/06/2019 1106 06/04/2019 1520 06/14/2019 1736 05/27/19 0147  WBC 7.4  --   --  9.8  LATICACIDVEN  --  2.7* 1.9  --     Liver Function Tests: Recent Labs  Lab 06/11/2019 1106  AST 21  ALT 18  ALKPHOS 122  BILITOT 0.6  PROT 6.8  ALBUMIN 1.9*   No results for input(s): LIPASE, AMYLASE in the last 168 hours. No results for input(s): AMMONIA in the last 168 hours.  ABG    Component Value Date/Time   PHART 7.362 05/27/2019 0105   PCO2ART 30.7 (L) 05/27/2019 0105   PO2ART 138 (H) 05/27/2019 0105   HCO3 17.2 (L) 05/27/2019 0105   TCO2 26 09/04/2011 1549   ACIDBASEDEF 7.3 (H) 05/27/2019 0105   O2SAT 99.1 05/27/2019 0105     Coagulation Profile: No results for input(s): INR, PROTIME in the last 168 hours.  Cardiac Enzymes: No results for input(s): CKTOTAL, CKMB, CKMBINDEX, TROPONINI in the last 168 hours.  HbA1C: Hgb A1c MFr Bld  Date/Time Value Ref Range Status  07/09/2015 03:50 PM 7.0 (H) 4.8 - 5.6 % Final    Comment:    (NOTE)         Pre-diabetes: 5.7 - 6.4         Diabetes: >6.4         Glycemic control for adults with diabetes: <  7.0     CBG: Recent Labs  Lab 06/06/2019 2018 06/18/2019 2340 05/27/19 0145 05/27/19 0216 05/27/19 0323  GLUCAP 79 112* 54* 115* 87    Review of Systems:   Unable to obtain secondary to altered mental status  Past Medical History  He,  has a past medical history of Acute idiopathic pericarditis (2008), Childhood asthma, Chronic kidney disease, Chronic lower back pain, Chronic sinusitis, Duodenal ulcer, chronic, GERD (gastroesophageal reflux disease), Headache, History of kidney stones (1960'S), Hyperlipidemia, Hypothyroidism, Left trigger finger, Lung cancer ([place]) (1995), Normal echocardiogram (08/2011), Normal nuclear stress test (08/2011), NSVT (nonsustained ventricular tachycardia) ([facility]), PAF (paroxysmal atrial fibrillation) ([facility]) (10 YRS AGO NOT CURRENT PROBLEM), and Type II diabetes mellitus ([place]).    Surgical History    Past Surgical History:  Procedure Laterality Date  . ATRIAL FIBRILLATION ABLATION  early 2000s   "@ [facility]"  . BACK SURGERY    . CARDIAC CATHETERIZATION N/A 07/09/2015   Procedure: Left Heart Cath and Coronary Angiography;  Surgeon: Belva Crome, MD;  Location: [place] [facility];  Service: Cardiovascular;  Laterality: N/A;  . CATARACT EXTRACTION W/ INTRAOCULAR LENS  IMPLANT, BILATERAL Bilateral   . INGUINAL HERNIA REPAIR Left 2000  . INGUINAL HERNIA REPAIR Right 11/11/2017   Procedure: RIGHT INGUINAL HERNIA REPAIR WITH MESH;  Surgeon: Coralie Keens, MD;  Location: [facility];  Service: General;  Laterality: Right;  . INSERTION OF MESH Right 11/11/2017   Procedure: INSERTION OF MESH;  Surgeon: Coralie Keens, MD;  Location: [facility];  Service: General;  Laterality: Right;  . LEFT LUNG REMOVED      DR Arlyce Dice LUNG CANCER  . LUMBAR MICRODISCECTOMY  1984   "where sciatic nerve goes thru"  . PNEUMONECTOMY Left 1995   lung cancer  . SKIN CANCER AREAS REMOVED FROM EAR AND CHEST       Social History   reports that he quit smoking about 25 years ago. His smoking use included cigarettes. He has a 40.00 pack-year smoking history. He has quit using smokeless tobacco.  His smokeless tobacco use included chew. He reports previous alcohol use. He reports that he does not use drugs.   Family History   His family history includes Cancer in an other family member; Cancer - Colon in his brother; Coronary artery disease in an other family member; Diabetes in an other family member; Emphysema in an other family member; Healthy in his daughter and son; Heart failure in his father and another family member; Hypertension in an other family member; Stroke in his brother, mother, and another family member.   Allergies Allergies  Allergen Reactions  . Iodinated Diagnostic Agents Hives, Itching and Other (See Comments)    Other Reaction: stinging  . Doxycycline Nausea And Vomiting  .  Empagliflozin     DIZZINESS, HEACAHES  Other reaction(s): headache "Jardiance"     Home Medications  Prior to Admission medications   Medication Sig Start Date End Date Taking? Authorizing Provider  acetaminophen (TYLENOL) 500 MG tablet Take 500 mg by mouth every 6 (six) hours as needed for moderate pain or headache.    [provider]  baclofen (LIORESAL) 10 MG tablet Take 10 mg by mouth 3 (three) times daily. PRN    [provider]  budesonide-formoterol (SYMBICORT) 160-4.5 MCG/ACT inhaler Inhale 2 puffs into the lungs 2 (two) times daily as needed (for shortness of breath).    [provider]  cholecalciferol (VITAMIN D3) 25 MCG (1000 UT) tablet Take 1,000 Units by  mouth daily.    [provider]  Cyanocobalamin (VITAMIN B 12 PO) Take 1 tablet by mouth daily.    [provider]  esomeprazole (NEXIUM) 40 MG capsule Take 40 mg by mouth daily at 12 noon.    [provider]  glimepiride (AMARYL) 2 MG tablet Take 2 mg by mouth daily. 06/07/18   [provider]  levothyroxine (SYNTHROID) 100 MCG tablet Take 100 mcg by mouth daily before breakfast.    [provider]  losartan (COZAAR) 50 MG tablet Take 25 mg by mouth daily.  06/01/11   [provider]  lubiprostone (AMITIZA) 8 MCG capsule Take 8 mcg by mouth 2 (two) times daily with a meal. PRN    [provider]  metoprolol (LOPRESSOR) 50 MG tablet Take 50 mg by mouth daily. 06/12/13   [provider]  nitroGLYCERIN (NITROSTAT) 0.4 MG SL tablet Place 1 tablet (0.4 mg total) under the tongue every 5 (five) minutes as needed for chest pain. 09/06/11 03/20/19  Richardson Dopp T, PA-C  ONE TOUCH ULTRA TEST test strip  04/11/18   [provider]  OVER THE COUNTER MEDICATION Place 2 drops into both ears daily as needed (for ear aches). [place] Ear Ache Drops    [provider]  traMADol (ULTRAM) 50 MG tablet Take 1 tablet (50 mg total) by  mouth every 6 (six) hours as needed for moderate pain or severe pain. 11/12/17   Coralie Keens, MD     Critical care time: 50 minutes    CRITICAL CARE Performed by: Otilio Carpen Lannis Lichtenwalner   Total critical care time: 50 minutes  Critical care time was exclusive of separately billable procedures and treating other patients.  Critical care was necessary to treat or prevent imminent or life-threatening deterioration.  Critical care was time spent personally by me on the following activities: development of treatment plan with patient and/or surrogate as well as nursing, discussions with consultants, evaluation of patient's response to treatment, examination of patient, obtaining history from patient or surrogate, ordering and performing treatments and interventions, ordering and review of laboratory studies, ordering and review of radiographic studies, pulse oximetry and re-evaluation of patient's condition.   Otilio Carpen Laree Garron, PA-C [place] [facility]  Pager# [phone], if no answer [phone]

## 2019-05-27 NOTE — Consult Note (Addendum)
[address]       [place] [address]             [phone]          CARDIOTHORACIC SURGERY CONSULTATION REPORT  PCP is Jonathon Jordan, MD Referring Provider is Lequita Halt, MD Primary Cardiologist is No primary care provider on file.  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  He was initially evaluated by Dr. Kipp Brood and subsequently referred to [facility] where he underwent surgical drainage of his left pleural space by Dr. Felicie Morn on April 13, 2019.  The patient was hospitalized for prolonged period time and reportedly developed atrial flutter that was difficult to control.  Plans for DC cardioversion were aborted because echocardiogram revealed thrombus in the left atrium.  The patient was ultimately discharged home on oral Zyvox with home health care managing his complicated wound which was treated using dressing changes on a Monday Wednesday Friday schedule.  He was chronically anticoagulated using Eliquis.  The patient presented to the emergency department yesterday evening with palpitations and shortness of breath without any associated history of fever or cough.  By the report the patient was breathing comfortably at the time of admission.  White blood count was normal.  Chest CT scan revealed packing throughout the left chest consistent with known open chest wound with some mild opacity in the mid right lung field suggestive of possible early developing pneumonia.  Patient was admitted to the stepdown unit where overnight the patient  reportedly suffered a bradycardic arrest shortly after receiving multiple drugs including high-dose metoprolol and Cardizem.  Patient was transferred to the intensive care unit where he has reportedly remained stable on mechanical ventilation and IV epinephrine.  Patient is lightly sedated but arousable and follows commands.  He denies pain or air hunger.  Remainder of review of systems cannot be obtained at this time.  Brief review of systems previously documented by admitting physician has been reviewed.  Past Medical History:  Diagnosis Date  . Acute idiopathic pericarditis 2008   s/p fluoroscopic pericardial window at [facility]  . Childhood asthma   . Chronic kidney disease    STAGE 2 CKD FOLLOWED BY PRIMARY DR Wynelle Link  . Chronic lower back pain   . Chronic sinusitis   . Duodenal ulcer, chronic   . GERD (gastroesophageal reflux disease)   . Headache    "at least once/week; sometimes more" (07/09/2015)  . History of kidney stones 1960'S  . Hyperlipidemia   . Hypothyroidism   . Left trigger finger   . Lung cancer ([place]) 1995   s/p left pneumonectomy in 1995; also treated with radiation  . Normal echocardiogram 08/2011  . Normal nuclear stress test 08/2011   EF was slightly down but normal by echo; no ischemia or scar noted.   Marland Kitchen NSVT (nonsustained ventricular tachycardia) ([place])   . PAF (paroxysmal atrial fibrillation) ([place]) 10 YRS AGO NOT CURRENT PROBLEM   s/p ablation at [facility]  . Type II diabetes mellitus ([place])     Past Surgical History:  Procedure Laterality Date  . ATRIAL  FIBRILLATION ABLATION  early 2000s   "@ [facility]"  . BACK SURGERY    . CARDIAC CATHETERIZATION N/A 07/09/2015   Procedure: Left Heart Cath and Coronary Angiography;  Surgeon: Belva Crome, MD;  Location: [facility];  Service: Cardiovascular;  Laterality: N/A;  . CATARACT EXTRACTION W/ INTRAOCULAR LENS  IMPLANT, BILATERAL Bilateral   . INGUINAL HERNIA REPAIR Left 2000  . INGUINAL HERNIA REPAIR Right  11/11/2017   Procedure: RIGHT INGUINAL HERNIA REPAIR WITH MESH;  Surgeon: Coralie Keens, MD;  Location: [facility];  Service: General;  Laterality: Right;  . INSERTION OF MESH Right 11/11/2017   Procedure: INSERTION OF MESH;  Surgeon: Coralie Keens, MD;  Location: [facility];  Service: General;  Laterality: Right;  . LEFT LUNG REMOVED      DR Arlyce Dice LUNG CANCER  . LUMBAR MICRODISCECTOMY  1984   "where sciatic nerve goes thru"  . PNEUMONECTOMY Left 1995   lung cancer  . SKIN CANCER AREAS REMOVED FROM EAR AND CHEST      Family History  Problem Relation Age of Onset  . Stroke Mother   . Heart failure Father   . Heart failure Other   . Stroke Other   . Cancer Other   . Emphysema Other   . Hypertension Other   . Diabetes Other   . Coronary artery disease Other   . Cancer - Colon Brother   . Stroke Brother   . Healthy Daughter   . Healthy Son     Social History   Socioeconomic History  . Marital status: Married    Spouse name: Not on file  . Number of children: 2  . Years of education: Not on file  . Highest education level: 9th grade  Occupational History  . Occupation: retired  Tobacco Use  . Smoking status: Former Smoker    Packs/day: 1.00    Years: 40.00    Pack years: 40.00    Types: Cigarettes    Quit date: 07/23/1993    Years since quitting: 25.8  . Smokeless tobacco: Former Systems developer    Types: Chew  . Tobacco comment: "chewed when I was real young"  Substance and Sexual Activity  . Alcohol use: Not Currently  . Drug use: No  . Sexual activity: Yes  Other Topics Concern  . Not on file  Social History Narrative  . Not on file   Social Determinants of Health   Financial Resource Strain:   . Difficulty of Paying Living Expenses: Not on file  Food Insecurity:   . Worried About Charity fundraiser in the Last Year: Not on file  . Ran Out of Food in the Last Year: Not on file  Transportation Needs:   . Lack of Transportation (Medical): Not on file  . Lack of  Transportation (Non-Medical): Not on file  Physical Activity:   . Days of Exercise per Week: Not on file  . Minutes of Exercise per Session: Not on file  Stress:   . Feeling of Stress : Not on file  Social Connections:   . Frequency of Communication with Friends and Family: Not on file  . Frequency of Social Gatherings with Friends and Family: Not on file  . Attends Religious Services: Not on file  . Active Member of Clubs or Organizations: Not on file  . Attends Archivist Meetings: Not on file  . Marital Status: Not on file  Intimate Partner Violence:   . Fear of Current or Ex-Partner:  Not on file  . Emotionally Abused: Not on file  . Physically Abused: Not on file  . Sexually Abused: Not on file    Prior to Admission medications   Medication Sig Start Date End Date Taking? Authorizing Provider  acetaminophen (TYLENOL) 500 MG tablet Take 500 mg by mouth every 6 (six) hours as needed for moderate pain or headache.    [provider]  baclofen (LIORESAL) 10 MG tablet Take 10 mg by mouth 3 (three) times daily. PRN    [provider]  budesonide-formoterol (SYMBICORT) 160-4.5 MCG/ACT inhaler Inhale 2 puffs into the lungs 2 (two) times daily as needed (for shortness of breath).    [provider]  cholecalciferol (VITAMIN D3) 25 MCG (1000 UT) tablet Take 1,000 Units by mouth daily.    [provider]  Cyanocobalamin (VITAMIN B 12 PO) Take 1 tablet by mouth daily.    [provider]  esomeprazole (NEXIUM) 40 MG capsule Take 40 mg by mouth daily at 12 noon.    [provider]  glimepiride (AMARYL) 2 MG tablet Take 2 mg by mouth daily. 06/07/18   [provider]  levothyroxine (SYNTHROID) 100 MCG tablet Take 100 mcg by mouth daily before breakfast.    [provider]  losartan (COZAAR) 50 MG tablet Take 25 mg by mouth daily.  06/01/11   [provider]  lubiprostone (AMITIZA) 8 MCG capsule Take 8 mcg by  mouth 2 (two) times daily with a meal. PRN    [provider]  metoprolol (LOPRESSOR) 50 MG tablet Take 50 mg by mouth daily. 06/12/13   [provider]  nitroGLYCERIN (NITROSTAT) 0.4 MG SL tablet Place 1 tablet (0.4 mg total) under the tongue every 5 (five) minutes as needed for chest pain. 09/06/11 03/20/19  Richardson Dopp T, PA-C  ONE TOUCH ULTRA TEST test strip  04/11/18   [provider]  OVER THE COUNTER MEDICATION Place 2 drops into both ears daily as needed (for ear aches). [place] Ear Ache Drops    [provider]  traMADol (ULTRAM) 50 MG tablet Take 1 tablet (50 mg total) by mouth every 6 (six) hours as needed for moderate pain or severe pain. 11/12/17   Coralie Keens, MD    Current Facility-Administered Medications  Medication Dose Route Frequency Provider Last Rate Last Admin  . acetaminophen (TYLENOL) tablet 500 mg  500 mg Oral Q6H PRN Wynetta Fines T, MD      . apixaban Arne Cleveland) tablet 5 mg  5 mg Oral BID Wynetta Fines T, MD   5 mg at 06/04/2019 2100  . baclofen (LIORESAL) tablet 10 mg  10 mg Oral TID Wynetta Fines T, MD   10 mg at 06/08/2019 2100  . ceFEPIme (MAXIPIME) 2 g in sodium chloride 0.9 % 100 mL IVPB  2 g Intravenous Q12H Tyrone Apple, [facility]   Stopped at 05/27/19 0241  . chlorhexidine gluconate (MEDLINE KIT) (PERIDEX) 0.12 % solution 15 mL  15 mL Mouth Rinse BID Scatliffe, Rise Paganini, MD   15 mL at 05/27/19 0821  . Chlorhexidine Gluconate Cloth 2 % PADS 6 each  6 each Topical Daily Scatliffe, Kristen D, MD      . Chlorhexidine Gluconate Cloth 2 % PADS 6 each  6 each Topical Q0600 Scatliffe, Rise Paganini, MD   6 each at 05/27/19 0430  . EPINEPHrine (ADRENALIN) 4 mg in NS 250 mL (0.016 mg/mL) premix infusion  0.5-20 mcg/min Intravenous Titrated Gleason, Otilio Carpen, PA-C 26.3  mL/hr at 05/27/19 0450 7 mcg/min at 05/27/19 0450  . fentaNYL (SUBLIMAZE) bolus via infusion 25 mcg  25 mcg Intravenous Q15 min PRN Gleason, Otilio Carpen, PA-C      . fentaNYL 2537mg in NS  2566m(1068mml) infusion-PREMIX  25-200 mcg/hr Intravenous Continuous Gleason, LauOtilio CarpenA-C 2.5 mL/hr at 05/27/19 0425 25 mcg/hr at 05/27/19 0425  . glucagon (human recombinant) (GLUCAGEN) injection 1 mg  1 mg Intravenous Once PRN Blount, Xenia T, NP      . insulin aspart (novoLOG) injection 0-9 Units  0-9 Units Subcutaneous TID WC ZhaWynetta Fines MD      . insulin glargine (LANTUS) injection 12 Units  12 Units Subcutaneous QHS ZhaLequita HaltD   12 Units at 06/01/2019 2144  . levothyroxine (SYNTHROID) tablet 100 mcg  100 mcg Oral Q0600 ZhaWynetta Fines MD      . lubiprostone (AMITIZA) capsule 8 mcg  8 mcg Oral BID PRN ZhaWynetta Fines MD      . MEDLINE mouth rinse  15 mL Mouth Rinse 10 times per day ScaKandice HamsD   15 mL at 05/27/19 [id number] nitroGLYCERIN (NITROSTAT) SL tablet 0.4 mg  0.4 mg Sublingual Q5 min PRN ZhaWynetta Fines MD      . pantoprazole (PROTONIX) EC tablet 40 mg  40 mg Oral Daily ZhaWynetta Fines MD      . sodium chloride flush (NS) 0.9 % injection 3 mL  3 mL Intravenous Once Tegeler, ChrGwenyth AllegraD      . vancomycin (VANCOREADY) IVPB 1250 mg/250 mL  1,250 mg Intravenous Q24H Dang, Thuy D, RPHWilson Medical Center     Allergies  Allergen Reactions  . Iodinated Diagnostic Agents Hives, Itching and Other (See Comments)    Other Reaction: stinging  . Doxycycline Nausea And Vomiting  . Empagliflozin     DIZZINESS, HEACAHES  Other reaction(s): headache "[place]ard[place]"      Review of Systems:  Per HPI     Physical Exam:   BP (!) 110/57   Pulse 90   Temp 98.2 F (36.8 C) (Axillary)   Resp 16   Ht '5\' 10"'  (1.778 m)   Wt 79.8 kg   SpO2 100%   BMI 25.25 kg/m   General:  Intubated on vent, resting comfortably  HEENT:  Unremarkable   Neck:   no JVD, no bruits, no adenopathy   Chest:   Breath sounds on right fairly clear, coarse  Chest wound:  Examined during dressing change - expected large empyema space without signs of any loculated fluid or inadequate drainage, some superficial  exudate in scattered areas throughout  CV:   Tachycardic, no murmur  Abdomen:  soft, non-tender  Extremities:  warm, well-perfused, pulses thready, 3+ lower extremity edema  Rectal/GU  Deferred  Neuro:   Grossly non-focal and symmetrical throughout  Skin:   Clean and dry, no rashes, no breakdown  Diagnostic Tests:  Lab Results: Recent Labs    05/28/2019 1106 05/27/19 0147 05/27/19 0526  WBC 7.4 9.8  --   HGB 10.0* 9.3* 9.9*  HCT 32.2* 31.1* 29.0*  PLT 352 348  --    BMET:  Recent Labs    06/12/2019 1106 05/27/19 0147 05/27/19 0526  NA 129* 127* 129*  K 4.7 5.7* 5.1  CL 92* 93*  --   CO2 27 18*  --   GLUCOSE 177* 209*  --   BUN 17 19  --   CREATININE 1.31* 1.52*  --  CALCIUM 8.4* 7.7*  --     CBG (last 3)  Recent Labs    05/27/19 0216 05/27/19 0323 05/27/19 0747  GLUCAP 115* 87 75   PT/INR:  No results for input(s): LABPROT, INR in the last 72 hours.  CXR:  PORTABLE CHEST 1 VIEW  COMPARISON:  Radiograph earlier this day. Chest CT yesterday.  FINDINGS: Endotracheal tube tip 4.9 cm from the carina. History of left pneumonectomy. Packing material and air in the left hemithorax, better characterized on CT yesterday. Right costophrenic angle not included in the field of view, right pleural effusion on CT is not well seen. Minimal patchy opacity in the periphery of the right mid lung corresponds to airspace disease in the right upper lobe. No right pneumothorax. Normal heart size with unchanged mediastinal contours.  IMPRESSION: 1. Endotracheal tube tip 4.9 cm from the carina. 2. Prior left pneumonectomy. Packing material and air in the left hemithorax, better characterized on CT yesterday. 3. Minimal patchy opacity in the periphery of the right mid lung corresponds to airspace disease in the right upper lobe on CT.   Electronically Signed   By: Keith Rake M.D.   On: 05/27/2019 04:11    CT CHEST WITHOUT CONTRAST  TECHNIQUE: Multidetector  CT imaging of the chest was performed following the standard protocol without IV contrast.  COMPARISON:  Chest radiograph earlier today.  03/09/2019 chest CT.  FINDINGS: Cardiovascular: Aortic and branch vessel atherosclerosis. Tortuous thoracic aorta. Normal heart size, without pericardial effusion. Multivessel coronary artery atherosclerosis.  Mediastinum/Nodes: No supraclavicular adenopathy. No mediastinal or definite hilar adenopathy, given limitations of unenhanced CT. Normal appearance of the esophagus, without communication to the bronchus or pneumonectomy bed identified.  Lungs/Pleura: Small right pleural fluid is new since the prior CT.  Calcified right upper lobe granuloma.  Posterior right upper lobe mild airspace and ground-glass opacity is new, including on 64/6.  Subpleural right lower lobe 4 mm pulmonary nodule on 90/6 is new since the prior, most likely a subpleural lymph node.  Mild centrilobular emphysema.  Left pneumonectomy. Interval evacuation of the fluid within the pneumonectomy bed. There is gas and presumed packing material within the operative bed. Presumed postsurgical communication to the skin surface superiorly on 37/3 and more inferiorly and laterally on 47/3.  Upper Abdomen: Mild hepatic steatosis. Normal imaged portions of the spleen, stomach, pancreas, gallbladder, adrenal glands, left kidney.  Musculoskeletal: Postsurgical left rib defects. Moderate mid and lower thoracic spondylosis.  IMPRESSION: 1. Since 03/09/2019, drainage of the left pneumonectomy bed fluid, with placement of presumed surgical packing material. Postsurgical communication of the operative bed to the posterolateral left chest wall. 2. New small right pleural effusion with inferior right upper lobe airspace disease, suspicious for mild infection or aspiration. 3. Aortic atherosclerosis (ICD10-I70.0), coronary artery atherosclerosis and emphysema  (ICD10-J43.9). 4. Hepatic steatosis.   Electronically Signed   By: Abigail Miyamoto M.D.   On: 06/15/2019 14:51    Impression:  Patient with remote history of lung cancer status post left pneumonectomy in the distant past now with delayed bronchopleural fistula recently treated using Eloesser flap open drainage of the left hemithorax at [facility] who was admitted through the ED yesterday evening with increased palpitations and shortness of breath in the setting of rapid atrial fibrillation and atrial flutter.  The patient did not report any history of fever or increased cough prior to admission and white blood count was normal.  Patient apparently suffered a bradycardic event overnight after receiving increased  dose metoprolol and Cardizem and is now intubated on mechanical ventilation.      I have personally examined the patient's large empyema space which is well drained.  I have personally reviewed the patient's chest x-rays and chest CT scan which does reveal some mild opacity in the right lung which could represent developing pneumonia.  The patient is certainly at risk for aspiration.      Plan:  I recommend continued dressing changes on a daily basis and administration of broad-spectrum antibiotics.  All packing should be removed with every dressing change.  Once the patient is deemed medically stable he would probably be managed optimally by transfer back to [facility] where he has been cared for recently.  While the patient remains here I would recommend avoiding placing the patient with his right side down for any prolonged period of time and while in bed keep him supine or left side down as much as possible.  Early mobilization should be encouraged when possible.  We will continue to follow along periodically while the patient remains in the hospital but please call if specific problems or questions arise.   I spent in excess of 60 minutes  during the conduct of this hospital consultation and >50% of this time involved direct face-to-face encounter for counseling and/or coordination of the patient's care.    Valentina Gu. Roxy Manns, MD 05/27/2019 8:49 AM

## 2019-05-27 NOTE — Progress Notes (Addendum)
Noted pt hr 25 at desk  Checked on pt.  Pt slouching in bed with decrease LOC , agonal  Respirations.  Rapid called, code blue called. Medications given, pt transferred. Report given.  Md to call family.  See Rapid Response note.  [name] T

## 2019-05-27 NOTE — Plan of Care (Signed)
  Problem: Clinical Measurements: Goal: Ability to maintain clinical measurements within normal limits will improve Outcome: Progressing Goal: Respiratory complications will improve Outcome: Progressing Goal: Cardiovascular complication will be avoided Outcome: Progressing   Problem: Coping: Goal: Level of anxiety will decrease Outcome: Completed/Met   Problem: Pain Managment: Goal: General experience of comfort will improve Outcome: Progressing   Problem: Safety: Goal: Ability to remain free from injury will improve Outcome: Progressing   Problem: Skin Integrity: Goal: Risk for impaired skin integrity will decrease Outcome: Progressing

## 2019-05-27 NOTE — Progress Notes (Signed)
Initial Nutrition Assessment  DOCUMENTATION CODES:   Not applicable  INTERVENTION:   If unable to extubate patient within the next 24 hours, recommend begin TF via OGT:   Vital AF 1.2 at 55 ml/h (1320 ml per day)   Pro-stat 30 ml BID   Provides 1784 kcal, 129 gm protein, 1071 ml free water daily  NUTRITION DIAGNOSIS:   Inadequate oral intake related to inability to eat as evidenced by NPO status.  GOAL:   Patient will meet greater than or equal to 90% of their needs  MONITOR:   Vent status, Skin, Labs, Weight trends, I & O's  REASON FOR ASSESSMENT:   Ventilator    ASSESSMENT:   79 yo male admitted with SOB and palpitations. He developed hypotension and bradycardia with brief code blue and required intubation and transfer to the ICU early on 1/2. PMH includes recent hospitalization at [facility] for bronchopleural fistula S/P thoracoscopy & bronchoscopy, lung cancer (1995), pericardial effusion (2008), DM, hypothyroidism, PAF.   RD working remotely.  Patient is currently intubated on ventilator support. MV: 9.7 L/min Temp (24hrs), Avg:98 F (36.7 C), Min:97.6 F (36.4 C), Max:98.6 F (37 C)  Propofol: none  Labs reviewed. Sodium 129 (L), phos 4.8 (H) CBG's: [phone]  Medications reviewed and include novolog, lantus, epinephrine. Receiving broad spectrum antibiotics for RLL PNA, possible L chest surgical wound infection.   Per RN documentation, patient has +3 deep pitting edema to BLE as well as mild generalized edema.  NUTRITION - FOCUSED PHYSICAL EXAM:  unable to complete  Diet Order:   Diet Order            Diet NPO time specified  Diet effective now              EDUCATION NEEDS:   Not appropriate for education at this time  Skin:  Skin Assessment: Skin Integrity Issues: Skin Integrity Issues:: Other (Comment), Incisions Incisions: L back Other: non pressure wound L buttocks, healing wound R buttocks, MASD to buttocks  Last BM:   1/1  Height:   Ht Readings from Last 1 Encounters:  06/19/2019 5\' 10"  (1.778 m)    Weight:   Wt Readings from Last 1 Encounters:  06/24/2019 79.8 kg    Ideal Body Weight:  75.5 kg  BMI:  Body mass index is 25.25 kg/m.  Estimated Nutritional Needs:   Kcal:  1950  Protein:  120-135 gm  Fluid:  >/= 1.8 L    Molli Barrows, RD, LDN, [place] Pager [phone] After Hours Pager [phone]

## 2019-05-27 NOTE — Progress Notes (Addendum)
Pt feeling increased shortness of breath.  Increased o2 to 4l [place].  sats 99%.  Hr 30-59  bp 70's  Symptomatic Rapid called.  Cbg 112.  Crackles noted to Right lung.  Normal saline infusing at 75/hr.   Pt started feeling nauseas.  Md notified.  New orders received. Ekg  obtained = abnormal .   Will continue to monitor [name] T

## 2019-05-27 NOTE — Progress Notes (Signed)
CRITICAL VALUE ALERT  Critical Value:  Troponin 47  Date & Time Notied: 05/27/19 1925  Provider Notified: Dr.  Vickki Muff MD  Orders Received/Actions taken: no new orders at this time

## 2019-05-27 NOTE — Anesthesia Procedure Notes (Signed)
Procedure Name: Intubation Date/Time: 05/27/2019 3:31 AM Performed by: Babs Bertin, CRNA Pre-anesthesia Checklist: Patient identified, Emergency Drugs available, Suction available and Patient being monitored Oxygen Delivery Method: Ambu bag Preoxygenation: Pre-oxygenation with 100% oxygen Laryngoscope Size: Mac and 3 Grade View: Grade I Tube type: Subglottic suction tube Tube size: 7.5 mm Number of attempts: 1 Airway Equipment and Method: Stylet and Oral airway Placement Confirmation: ETT inserted through vocal cords under direct vision,  positive ETCO2 and breath sounds checked- equal and bilateral Secured at: 22 cm Tube secured with: Tape Dental Injury: Teeth and Oropharynx as per pre-operative assessment

## 2019-05-27 NOTE — Progress Notes (Signed)
Pt c/o not being able to see.  When asked pt if he could see at all, he said it was blurry.  So checked CBG = 54  Dr already at bedside for low bp and Hr gave amp D50 iv .   Rechecked 185.  Rapid response still at bedside.  Still monitoring closely.  Giving NS bolus.  sbp 60-90 hr 30-50. [name] T

## 2019-05-27 NOTE — Consult Note (Signed)
Cardiology Consultation:  Patient ID: Zachary Bean MRN: [id number]; DOB: 05-07-1941  Admit date: 06/22/2019 Date of Consult: 05/27/2019  Primary Care Provider: Jonathon Jordan, MD Primary Cardiologist: No primary care provider on file.   Patient Profile:  Zachary Bean is a 79 y.o. 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  History of Present Illness:  Mr. [name] was admitted on 05/25/2018 with drainage from recent chest wall wound. He was admitted to [facility] November and underwent a left thoracotomy with pleural window for repair of a bronchopleural fistula. This apparently was a delayed presentation of the complication from his prior lung cancer and lung resection surgery. The wound was apparently left open and he was planned to have close follow-up to manage this at [facility]. His hospitalization there was also complicated by atrial fibrillation with RVR. He developed this perioperatively. He underwent a cardioversion on 11/23 but then converted back to atrial fibrillation. He underwent a transesophageal echocardiogram that did show a left atrial thrombus. He was simply rate controlled and managed with Eliquis. Review of records from [place] show an ejection fraction of 45%. This was in the setting of atrial fibrillation with RVR. He was discharged home on diltiazem 360 mg extended release tablet, metoprolol succinate 75 mg twice daily as well as Eliquis 5 mg twice daily.  He was admitted to the hospital medicine service overnight with increased drainage from the recent left thoracotomy site. There were concerns for infection at the surgical site. He was also noted to be in A. fib with RVR admission. It was recommended to  continue metoprolol, diltiazem, amiodarone. Overnight, around 11:30 PM he was noted to be more short of breath. His O2 sats were in the low 90s despite 4 L nasal cannula. There was reports of him being unable to completely speak in sentences. He had diminished breath sounds in the lungs per report. He then became bradycardic and hypotensive. He was given boluses of intravenous fluid but symptoms persisted. He eventually had a cardiac arrest around 4 minutes. The rhythm was PEA around 3:23 AM. There were around 4 minutes of chest compressions reported. He was briefly on dopamine for bradycardia but this resolved. He was intubated and is remained that way. He remains in the ICU on pressor support and is ventilated. He has converted out of atrial fibrillation is in normal sinus rhythm in the 80s.  He is remained in the ICU today. Per critical care medicine they are weaning pressors as able. Ventilation settings are minimal. Laboratory data consistent with lactic acidosis in the setting of cardiac arrest. ABG also shows metabolic acidosis.  Heart Pathway Score:       Past Medical History: Past Medical History:  Diagnosis Date  . Acute idiopathic pericarditis 2008   s/p fluoroscopic pericardial window at [facility]  . Childhood asthma   . Chronic kidney disease    STAGE 2 CKD FOLLOWED BY PRIMARY DR Wynelle Link  . Chronic lower back pain   . Chronic sinusitis   . Duodenal ulcer, chronic   . GERD (gastroesophageal reflux disease)   . Headache    "at least once/week; sometimes more" (07/09/2015)  . History of kidney stones 1960'S  . Hyperlipidemia   . Hypothyroidism   . Left trigger finger   .  Lung cancer ([place]) 1995   s/p left pneumonectomy in 1995; also treated with radiation  . Normal echocardiogram 08/2011  . Normal nuclear stress test 08/2011   EF was slightly down but normal by echo; no ischemia or scar noted.   Marland Kitchen NSVT (nonsustained ventricular tachycardia) ([place])   . PAF (paroxysmal atrial  fibrillation) ([place]) 10 YRS AGO NOT CURRENT PROBLEM   s/p ablation at [facility]  . Type II diabetes mellitus ([place])     Past Surgical History: Past Surgical History:  Procedure Laterality Date  . ATRIAL FIBRILLATION ABLATION  early 2000s   "@ [facility]"  . BACK SURGERY    . CARDIAC CATHETERIZATION N/A 07/09/2015   Procedure: Left Heart Cath and Coronary Angiography;  Surgeon: Belva Crome, MD;  Location: [facility];  Service: Cardiovascular;  Laterality: N/A;  . CATARACT EXTRACTION W/ INTRAOCULAR LENS  IMPLANT, BILATERAL Bilateral   . INGUINAL HERNIA REPAIR Left 2000  . INGUINAL HERNIA REPAIR Right 11/11/2017   Procedure: RIGHT INGUINAL HERNIA REPAIR WITH MESH;  Surgeon: Coralie Keens, MD;  Location: [facility];  Service: General;  Laterality: Right;  . INSERTION OF MESH Right 11/11/2017   Procedure: INSERTION OF MESH;  Surgeon: Coralie Keens, MD;  Location: [facility];  Service: General;  Laterality: Right;  . LEFT LUNG REMOVED      DR Arlyce Dice LUNG CANCER  . LUMBAR MICRODISCECTOMY  1984   "where sciatic nerve goes thru"  . PNEUMONECTOMY Left 1995   lung cancer  . SKIN CANCER AREAS REMOVED FROM EAR AND CHEST       Home Medications:  Prior to Admission medications   Medication Sig Start Date End Date Taking? Authorizing Provider  acetaminophen (TYLENOL) 500 MG tablet Take 500 mg by mouth every 6 (six) hours as needed for moderate pain or headache.    [provider]  amiodarone (PACERONE) 200 MG tablet Take 200 mg by mouth daily. 05/21/19   [provider]  baclofen (LIORESAL) 10 MG tablet Take 10 mg by mouth 3 (three) times daily. PRN    [provider]  budesonide-formoterol (SYMBICORT) 160-4.5 MCG/ACT inhaler Inhale 2 puffs into the lungs 2 (two) times daily as needed (for shortness of breath).    [provider]  cholecalciferol (VITAMIN D3) 25 MCG (1000 UT) tablet Take 1,000 Units by mouth daily.    [provider]  Cyanocobalamin (VITAMIN  B 12 PO) Take 1 tablet by mouth daily.    [provider]  diltiazem (CARDIZEM CD) 360 MG 24 hr capsule Take 360 mg by mouth daily. 05/21/19   [provider]  ELIQUIS 5 MG TABS tablet Take 5 mg by mouth 2 (two) times daily. 05/21/19   [provider]  esomeprazole (NEXIUM) 40 MG capsule Take 40 mg by mouth daily at 12 noon.    [provider]  glimepiride (AMARYL) 2 MG tablet Take 2 mg by mouth daily. 06/07/18   [provider]  HYSEPT 0.5 % SOLN Apply 1 application topically daily.  05/24/19   [provider]  Insulin Aspart FlexPen 100 UNIT/ML SOPN Inject 4 Units into the skin 3 (three) times daily. 05/21/19   [provider]  LANTUS SOLOSTAR 100 UNIT/ML Solostar Pen Inject 12 Units into the skin daily. 05/21/19   [provider]  levothyroxine (SYNTHROID) 100 MCG tablet Take 100 mcg by mouth daily before breakfast.    [provider]  linezolid (ZYVOX) 600 MG tablet Take 600 mg by mouth 2 (  two) times daily. For 7 days 05/21/19   [provider]  losartan (COZAAR) 50 MG tablet Take 25 mg by mouth daily.  06/01/11   [provider]  lubiprostone (AMITIZA) 8 MCG capsule Take 8 mcg by mouth 2 (two) times daily with a meal. PRN    [provider]  metoprolol (LOPRESSOR) 50 MG tablet Take 50 mg by mouth daily. 06/12/13   [provider]  metoprolol succinate (TOPROL-XL) 25 MG 24 hr tablet Take 75 mg by mouth 2 (two) times daily. 05/21/19   [provider]  nitroGLYCERIN (NITROSTAT) 0.4 MG SL tablet Place 1 tablet (0.4 mg total) under the tongue every 5 (five) minutes as needed for chest pain. 09/06/11 03/20/19  Richardson Dopp T, PA-C  ONE TOUCH ULTRA TEST test strip  04/11/18   [provider]  OVER THE COUNTER MEDICATION Place 2 drops into both ears daily as needed (for ear aches). [place] Ear Ache Drops    [provider]  oxyCODONE (OXY IR/ROXICODONE) 5 MG  immediate release tablet Take 5 mg by mouth every 4 (four) hours as needed for pain. 05/21/19   [provider]  traMADol (ULTRAM) 50 MG tablet Take 1 tablet (50 mg total) by mouth every 6 (six) hours as needed for moderate pain or severe pain. 11/12/17   Coralie Keens, MD    Inpatient Medications: Scheduled Meds: . apixaban  5 mg Oral BID  . baclofen  10 mg Oral TID  . chlorhexidine gluconate (MEDLINE KIT)  15 mL Mouth Rinse BID  . Chlorhexidine Gluconate Cloth  6 each Topical Daily  . Chlorhexidine Gluconate Cloth  6 each Topical Q0600  . insulin aspart  0-9 Units Subcutaneous TID WC  . insulin glargine  12 Units Subcutaneous QHS  . levothyroxine  100 mcg Oral Q0600  . mouth rinse  15 mL Mouth Rinse 10 times per day  . pantoprazole  40 mg Oral Daily  . sodium chloride flush  3 mL Intravenous Once   Continuous Infusions: . sodium chloride 250 mL (05/27/19 1508)  . ceFEPime (MAXIPIME) IV Stopped (05/27/19 0241)  . epinephrine 9 mcg/min (05/27/19 1200)  . fentaNYL infusion INTRAVENOUS 25 mcg/hr (05/27/19 0425)  . norepinephrine (LEVOPHED) Adult infusion 2 mcg/min (05/27/19 1504)  . vancomycin     PRN Meds: acetaminophen, fentaNYL, glucagon (human recombinant), lubiprostone, nitroGLYCERIN  Allergies:    Allergies  Allergen Reactions  . Iodinated Diagnostic Agents Hives, Itching and Other (See Comments)    Other Reaction: stinging  . Doxycycline Nausea And Vomiting  . Empagliflozin     DIZZINESS, HEACAHES  Other reaction(s): headache "Jardiance"    Social History:   Social History   Socioeconomic History  . Marital status: Married    Spouse name: Not on file  . Number of children: 2  . Years of education: Not on file  . Highest education level: 9th grade  Occupational History  . Occupation: retired  Tobacco Use  . Smoking status: Former Smoker    Packs/day: 1.00    Years: 40.00    Pack years: 40.00    Types: Cigarettes    Quit date: 07/23/1993     Years since quitting: 25.8  . Smokeless tobacco: Former Systems developer    Types: Chew  . Tobacco comment: "chewed when I was real young"  Substance and Sexual Activity  . Alcohol use: Not Currently  . Drug use: No  . Sexual activity: Yes  Other Topics Concern  . Not on file  Social History Narrative  . Not on file   Social Determinants of Health   Financial Resource Strain:   . Difficulty of Paying Living Expenses: Not on file  Food Insecurity:   . Worried About Charity fundraiser in the Last Year: Not on file  . Ran Out of Food in the Last Year: Not on file  Transportation Needs:   . Lack of Transportation (Medical): Not on file  . Lack of Transportation (Non-Medical): Not on file  Physical Activity:   . Days of Exercise per Week: Not on file  . Minutes of Exercise per Session: Not on file  Stress:   . Feeling of Stress : Not on file  Social Connections:   . Frequency of Communication with Friends and Family: Not on file  . Frequency of Social Gatherings with Friends and Family: Not on file  . Attends Religious Services: Not on file  . Active Member of Clubs or Organizations: Not on file  . Attends Archivist Meetings: Not on file  . Marital Status: Not on file  Intimate Partner Violence:   . Fear of Current or Ex-Partner: Not on file  . Emotionally Abused: Not on file  . Physically Abused: Not on file  . Sexually Abused: Not on file     Family History:    Family History  Problem Relation Age of Onset  . Stroke Mother   . Heart failure Father   . Heart failure Other   . Stroke Other   . Cancer Other   . Emphysema Other   . Hypertension Other   . Diabetes Other   . Coronary artery disease Other   . Cancer - Colon Brother   . Stroke Brother   . Healthy Daughter   . Healthy Son      ROS:  All other ROS reviewed and negative. Pertinent positives noted in the HPI.     Physical Exam/Data:   Vitals:   05/27/19 1415 05/27/19 1430 05/27/19 1445 05/27/19  1500  BP: (!) 126/42 (!) 118/44 (!) 114/46 117/64  Pulse: 88 90 88 (!) 115  Resp: '17 17 17 18  ' Temp:    98.6 F (37 C)  TempSrc:    Oral  SpO2: 100% 100% 100% 97%  Weight:      Height:         Intake/Output Summary (Last 24 hours) at 05/27/2019 1737 Last data filed at 05/27/2019 1200 Gross per 24 hour  Intake 2345.59 ml  Output 0 ml  Net 2345.59 ml    Last 3 Weights 06/06/2019 03/20/2019 02/21/2019  Weight (lbs) 176 lb 175 lb 180 lb 9.6 oz  Weight (kg) 79.833 kg 79.379 kg 81.92 kg    Body mass index is 25.25 kg/m.  General: Intubated on vent Head: Atraumatic, normal size  Eyes: PEERLA, EOMI  Neck: Supple, JVD noted 15 to 17 cm of water Endocrine: No thryomegaly Cardiac: Normal rate and rhythm, no murmurs rubs or gallops Lungs: Diminished breath sounds bilaterally Abd: Soft, nontender, no hepatomegaly  Ext: 2+ pitting edema Musculoskeletal: No deformities Skin: Left chest covered in clean dry dressing Neuro: Intubated sedated on vent  EKG:  The EKG was personally reviewed and demonstrates: EKG dated 05/27/2019 demonstrates atrial fibrillation with SVR around 40 Telemetry:  Telemetry was personally reviewed and demonstrates: Today demonstrates normal sinus rhythm with heart rate in 80  Relevant CV Studies: LHC 07/09/2015 1. Prox LAD lesion, 25% stenosed. 2. Prox RCA to Mid RCA lesion, 10%  stenosed.     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   No significant obstructive disease is noted.   Normal left ventricular function. Normal left ventricular end-diastolic pressure.  TTE 02/23/2019   1. Left ventricular ejection fraction, by visual estimation, is 55 to 60%. The left ventricle has normal function. Normal left ventricular size. There is no left ventricular hypertrophy.  2. Global right ventricle has normal systolic function.The right ventricular size is normal. No increase in right ventricular wall thickness.   3. Left atrial size was normal.  4. Right atrial size was normal.  5. Moderate pleural effusion in the left lateral region.  6. The mitral valve is normal in structure. Trace mitral valve regurgitation. No evidence of mitral stenosis.  7. The tricuspid valve is normal in structure. Tricuspid valve regurgitation was not visualized by color flow Doppler.  8. The aortic valve is normal in structure. Aortic valve regurgitation is trivial by color flow Doppler. Structurally normal aortic valve, with no evidence of sclerosis or stenosis.  9. The pulmonic valve was normal in structure. Pulmonic valve regurgitation is not visualized by color flow Doppler. 10. TR signal is inadequate for assessing pulmonary artery systolic pressure. 11. The inferior vena cava is normal in size with greater than 50% respiratory variability, suggesting right atrial pressure of 3 mmHg. 12. No atrial level shunt detected by color flow Doppler. Agitated saline contrast was given intravenously to evaluate for intracardiac shunting. Saline contrast bubble study was negative, with no evidence of any interatrial shunt. No ventricular septal  defect is seen or detected. There is no evidence of an atrial septal defect.  Laboratory Data: High Sensitivity Troponin:   Recent Labs  Lab 06/08/2019 1106 06/04/2019 1306 05/27/19 0147  TROPONINIHS '12 14 17     ' Cardiac EnzymesNo results for input(s): TROPONINI in the last 168 hours. No results for input(s): TROPIPOC in the last 168 hours.  Chemistry Recent Labs  Lab 05/29/2019 1106 05/27/19 0147 05/27/19 0526  NA 129* 127* 129*  K 4.7 5.7* 5.1  CL 92* 93*  --   CO2 27 18*  --   GLUCOSE 177* 209*  --   BUN 17 19  --   CREATININE 1.31* 1.52*  --   CALCIUM 8.4* 7.7*  --   GFRNONAA 53* 44*  --   GFRAA >60 51*  --   ANIONGAP 10 16*  --     Recent Labs  Lab 06/06/2019 1106  PROT 6.8  ALBUMIN 1.9*  AST 21  ALT 18  ALKPHOS 122  BILITOT 0.6   Hematology Recent Labs  Lab  06/07/2019 1106 05/27/19 0147 05/27/19 0526  WBC 7.4 9.8  --   RBC 3.48* 3.24*  --   HGB 10.0* 9.3* 9.9*  HCT 32.2* 31.1* 29.0*  MCV 92.5 96.0  --   MCH 28.7 28.7  --   MCHC 31.1 29.9*  --   RDW 14.6 14.6  --   PLT 352 348  --    BNP Recent Labs  Lab 06/12/2019 1106  BNP 175.6*    DDimer No results for input(s): DDIMER in the last 168 hours.  Radiology/Studies:  DG Chest 2 View  Result Date: 06/02/2019 CLINICAL DATA:  Chest pain. Status post lobectomy. EXAM: CHEST - 2 VIEW COMPARISON:  January 16, 2019. FINDINGS: Stable cardiomediastinal silhouette. No pneumothorax is noted. Right lung is clear. Postsurgical changes are noted in the left lung base.  Left basilar opacity is noted concerning for pneumonia or atelectasis with associated effusion. Bony thorax is unremarkable. IMPRESSION: Postsurgical changes are noted in left lung base. Left basilar opacity is noted concerning for pneumonia or atelectasis with associated effusion. Electronically Signed   By: Marijo Conception M.D.   On: 06/24/2019 11:48   CT Chest Wo Contrast  Result Date: 05/30/2019 CLINICAL DATA:  Left pneumonectomy 25 years ago secondary to cancer. Exploratory surgery 5 weeks ago at outside hospital secondary to fistula between the bronchus and esophagus. Substernal chest pain. Shortness of breath. Increased drainage. EXAM: CT CHEST WITHOUT CONTRAST TECHNIQUE: Multidetector CT imaging of the chest was performed following the standard protocol without IV contrast. COMPARISON:  Chest radiograph earlier today.  03/09/2019 chest CT. FINDINGS: Cardiovascular: Aortic and branch vessel atherosclerosis. Tortuous thoracic aorta. Normal heart size, without pericardial effusion. Multivessel coronary artery atherosclerosis. Mediastinum/Nodes: No supraclavicular adenopathy. No mediastinal or definite hilar adenopathy, given limitations of unenhanced CT. Normal appearance of the esophagus, without communication to the bronchus or pneumonectomy  bed identified. Lungs/Pleura: Small right pleural fluid is new since the prior CT. Calcified right upper lobe granuloma. Posterior right upper lobe mild airspace and ground-glass opacity is new, including on 64/6. Subpleural right lower lobe 4 mm pulmonary nodule on 90/6 is new since the prior, most likely a subpleural lymph node. Mild centrilobular emphysema. Left pneumonectomy. Interval evacuation of the fluid within the pneumonectomy bed. There is gas and presumed packing material within the operative bed. Presumed postsurgical communication to the skin surface superiorly on 37/3 and more inferiorly and laterally on 47/3. Upper Abdomen: Mild hepatic steatosis. Normal imaged portions of the spleen, stomach, pancreas, gallbladder, adrenal glands, left kidney. Musculoskeletal: Postsurgical left rib defects. Moderate mid and lower thoracic spondylosis. IMPRESSION: 1. Since 03/09/2019, drainage of the left pneumonectomy bed fluid, with placement of presumed surgical packing material. Postsurgical communication of the operative bed to the posterolateral left chest wall. 2. New small right pleural effusion with inferior right upper lobe airspace disease, suspicious for mild infection or aspiration. 3. Aortic atherosclerosis (ICD10-I70.0), coronary artery atherosclerosis and emphysema (ICD10-J43.9). 4. Hepatic steatosis. Electronically Signed   By: Abigail Miyamoto M.D.   On: 06/13/2019 14:51   DG CHEST PORT 1 VIEW  Result Date: 05/27/2019 CLINICAL DATA:  Endotracheally intubated. EXAM: PORTABLE CHEST 1 VIEW COMPARISON:  Radiograph earlier this day. Chest CT yesterday. FINDINGS: Endotracheal tube tip 4.9 cm from the carina. History of left pneumonectomy. Packing material and air in the left hemithorax, better characterized on CT yesterday. Right costophrenic angle not included in the field of view, right pleural effusion on CT is not well seen. Minimal patchy opacity in the periphery of the right mid lung corresponds to  airspace disease in the right upper lobe. No right pneumothorax. Normal heart size with unchanged mediastinal contours. IMPRESSION: 1. Endotracheal tube tip 4.9 cm from the carina. 2. Prior left pneumonectomy. Packing material and air in the left hemithorax, better characterized on CT yesterday. 3. Minimal patchy opacity in the periphery of the right mid lung corresponds to airspace disease in the right upper lobe on CT. Electronically Signed   By: Keith Rake M.D.   On: 05/27/2019 04:11   DG CHEST PORT 1 VIEW  Result Date: 05/27/2019 CLINICAL DATA:  Shortness of breath EXAM: PORTABLE CHEST 1 VIEW COMPARISON:  06/17/2019, 06/17/2018, CT chest 06/14/2019, 03/09/2019, radiograph 01/16/2019 FINDINGS: Patchy foci of airspace disease in the right mid lung and right base. Interval increased lucency in the left  thorax. Patient has a history of left pneumonectomy. Mild opacity at the left lung base remains. Mild shift to the left. Stable cardiomediastinal silhouette. IMPRESSION: 1. Status post left pneumonectomy. Interval increased lucency in the left thorax compared to most recent prior radiograph, possibly due to evacuation of previously noted material within the left chest cavity on chest CT 05/27/2019, and reflecting air within the left pleural cavity. Mild opacity remains at the left base. 2. Small foci of airspace disease in the right mid lung and right base, could reflect small foci of pneumonia Electronically Signed   By: Donavan Foil M.D.   On: 05/27/2019 01:35    Assessment and Plan:  1. PEA arrest -I reviewed the documentation from overnight. It appears there was significant shortness of breath and his inability to get comfortable prior to the arrest. He also was bradycardic during the shortness of breath. I highly suspect this was all hypoxia driven. The bradycardia could have been a secondary bystander from the hypoxia. It would be unusual to have a cardiac arrest with a heart rate in the 40s.  However, I do not have documentation of his O2 saturations just documentation from nursing that he was requiring more oxygen and complained of shortness of breath. -We will continue supportive care per critical care medicine -Continue pressors and wean them as able -Would recommend to recheck an echocardiogram. There is mention of reduced function around 45% on the transesophageal echocardiogram from [facility]. He also appears volume up on my examination. This could be related to volume resuscitation versus new diagnosis of heart failure. Will need an echocardiogram to determine this. -He had a left heart catheterization in 2017 that just had nonobstructive minimal disease. -For now would continue with pressor support and we can diurese him at a later date if oxygenation becomes an issue.  2. Atrial fibrillation with left atrial appendage thrombus -In November at [facility] he was noted to have a left atrial appendage thrombus on his transesophageal echocardiogram. They simply recommended rate control as well as Eliquis. -He has converted back to normal sinus rhythm. -I have started IV amiodarone to prevent any further recurrence of atrial fibrillation. We can transition to a p.o. load after he is off the ventilator. -Would also recommend to continue Eliquis 5 mg twice daily  For questions or updates, please contact [place] Please consult [email or web address] for contact info under     Signed, Lake Bells T. Audie Box, [place]  05/27/2019 5:37 PM

## 2019-05-27 NOTE — Progress Notes (Signed)
Pt c/o blurred vision. Checked CBG = 54  Gave 1 amp D50 per md. [name] T

## 2019-05-27 NOTE — Progress Notes (Signed)
CRITICAL VALUE ALERT  Critical Value: lactic acid 8.1  Date & Time Notied: 05/26/18 0619  Provider Notified: Dr. Oletta Darter  Orders Received/Actions taken: 1L normal saline bolus

## 2019-05-27 NOTE — Consult Note (Signed)
Responded to code blue page, no family present. Staff advised that pt to be transferred to [facility], will page again if further need of chaplain services.  Rev. Eloise Levels Chaplain

## 2019-05-27 NOTE — Consult Note (Signed)
[place] Nurse wound consult note Consultation was completed by review of records, images and assistance from the bedside nurse/clinical staff.   Reason for Consult: surgical wound Wound type: non healing surgical wound Pressure Injury POA: NA Measurement:13.5cmx 5cmx 15cm Wound TEL:MRAJHHID wound  Drainage (amount, consistency, odor) bedside nurse reports odor but not pseudomonas like, very green in color however; heavy drainage which is not uncommon with wounds like this Periwound: intact; slightly macerated Dressing procedure/placement/frequency: Reviewed Dr. Ricard Dillon note, updated orders based on his note.    May consider 1/4% Dakins solution for drainage however it is non healing and this solution while it may lessen odor will not be a treatment for wound healing; more of a palliative type dressing for odor.  Discussed POC with patient and bedside nurse.  Re consult if needed, will not follow at this time. Thanks  Kashari Chalmers [employer], RN,CWOCN, CNS, [place] [phone])

## 2019-05-28 ENCOUNTER — Inpatient Hospital Stay (HOSPITAL_COMMUNITY): Payer: PPO

## 2019-05-28 DIAGNOSIS — I361 Nonrheumatic tricuspid (valve) insufficiency: Secondary | ICD-10-CM

## 2019-05-28 DIAGNOSIS — I340 Nonrheumatic mitral (valve) insufficiency: Secondary | ICD-10-CM

## 2019-05-28 LAB — HEPATIC FUNCTION PANEL
ALT: 1295 U/L — ABNORMAL HIGH (ref 0–44)
AST: 1681 U/L — ABNORMAL HIGH (ref 15–41)
Albumin: 1.5 g/dL — ABNORMAL LOW (ref 3.5–5.0)
Alkaline Phosphatase: 209 U/L — ABNORMAL HIGH (ref 38–126)
Bilirubin, Direct: 0.3 mg/dL — ABNORMAL HIGH (ref 0.0–0.2)
Indirect Bilirubin: 0.6 mg/dL (ref 0.3–0.9)
Total Bilirubin: 0.9 mg/dL (ref 0.3–1.2)
Total Protein: 5.3 g/dL — ABNORMAL LOW (ref 6.5–8.1)

## 2019-05-28 LAB — CBC
HCT: 20.1 % — ABNORMAL LOW (ref 39.0–52.0)
HCT: 30.1 % — ABNORMAL LOW (ref 39.0–52.0)
Hemoglobin: 6.2 g/dL — CL (ref 13.0–17.0)
Hemoglobin: 9.2 g/dL — ABNORMAL LOW (ref 13.0–17.0)
MCH: 29.2 pg (ref 26.0–34.0)
MCH: 28 pg (ref 26.0–34.0)
MCHC: 30.8 g/dL (ref 30.0–36.0)
MCHC: 30.6 g/dL (ref 30.0–36.0)
MCV: 94.8 fL (ref 80.0–100.0)
MCV: 91.8 fL (ref 80.0–100.0)
Platelets: 131 K/uL — ABNORMAL LOW (ref 150–400)
Platelets: 157 10*3/uL (ref 150–400)
RBC: 2.12 MIL/uL — ABNORMAL LOW (ref 4.22–5.81)
RBC: 3.28 MIL/uL — ABNORMAL LOW (ref 4.22–5.81)
RDW: 15.2 % (ref 11.5–15.5)
RDW: 15.1 % (ref 11.5–15.5)
WBC: 5.2 K/uL (ref 4.0–10.5)
WBC: 6.8 10*3/uL (ref 4.0–10.5)
nRBC: 0.4 % — ABNORMAL HIGH (ref 0.0–0.2)
nRBC: 0.6 % — ABNORMAL HIGH (ref 0.0–0.2)

## 2019-05-28 LAB — TROPONIN I (HIGH SENSITIVITY): Troponin I (High Sensitivity): 41 ng/L — ABNORMAL HIGH (ref <18)

## 2019-05-28 LAB — BASIC METABOLIC PANEL
Anion gap: 11 (ref 5–15)
Anion gap: 13 (ref 5–15)
BUN: 32 mg/dL — ABNORMAL HIGH (ref 8–23)
BUN: 41 mg/dL — ABNORMAL HIGH (ref 8–23)
CO2: 18 mmol/L — ABNORMAL LOW (ref 22–32)
CO2: 18 mmol/L — ABNORMAL LOW (ref 22–32)
Calcium: 7.3 mg/dL — ABNORMAL LOW (ref 8.9–10.3)
Calcium: 7.5 mg/dL — ABNORMAL LOW (ref 8.9–10.3)
Chloride: 101 mmol/L (ref 98–111)
Chloride: 99 mmol/L (ref 98–111)
Creatinine, Ser: 2.31 mg/dL — ABNORMAL HIGH (ref 0.61–1.24)
Creatinine, Ser: 3.14 mg/dL — ABNORMAL HIGH (ref 0.61–1.24)
GFR calc Af Amer: 31 mL/min — ABNORMAL LOW (ref >60)
GFR calc Af Amer: 21 mL/min — ABNORMAL LOW (ref >60)
GFR calc non Af Amer: 26 mL/min — ABNORMAL LOW (ref >60)
GFR calc non Af Amer: 18 mL/min — ABNORMAL LOW (ref >60)
Glucose, Bld: 102 mg/dL — ABNORMAL HIGH (ref 70–99)
Glucose, Bld: 100 mg/dL — ABNORMAL HIGH (ref 70–99)
Potassium: 5.4 mmol/L — ABNORMAL HIGH (ref 3.5–5.1)
Potassium: 4.6 mmol/L (ref 3.5–5.1)
Sodium: 130 mmol/L — ABNORMAL LOW (ref 135–145)
Sodium: 130 mmol/L — ABNORMAL LOW (ref 135–145)

## 2019-05-28 LAB — ECHOCARDIOGRAM COMPLETE
Height: 70 in
Weight: 2816 oz

## 2019-05-28 LAB — LACTIC ACID, PLASMA: Lactic Acid, Venous: 3.7 mmol/L (ref 0.5–1.9)

## 2019-05-28 LAB — GLUCOSE, CAPILLARY
Glucose-Capillary: 118 mg/dL — ABNORMAL HIGH (ref 70–99)
Glucose-Capillary: 122 mg/dL — ABNORMAL HIGH (ref 70–99)
Glucose-Capillary: 53 mg/dL — ABNORMAL LOW (ref 70–99)
Glucose-Capillary: 57 mg/dL — ABNORMAL LOW (ref 70–99)
Glucose-Capillary: 63 mg/dL — ABNORMAL LOW (ref 70–99)
Glucose-Capillary: 74 mg/dL (ref 70–99)
Glucose-Capillary: 79 mg/dL (ref 70–99)
Glucose-Capillary: 92 mg/dL (ref 70–99)
Glucose-Capillary: 96 mg/dL (ref 70–99)
Glucose-Capillary: 97 mg/dL (ref 70–99)

## 2019-05-28 LAB — PHOSPHORUS: Phosphorus: 5.2 mg/dL — ABNORMAL HIGH (ref 2.5–4.6)

## 2019-05-28 LAB — MAGNESIUM
Magnesium: 1.9 mg/dL (ref 1.7–2.4)
Magnesium: 2.1 mg/dL (ref 1.7–2.4)

## 2019-05-28 LAB — APTT: aPTT: 44 seconds — ABNORMAL HIGH (ref 24–36)

## 2019-05-28 LAB — ABO/RH: ABO/RH(D): A NEG

## 2019-05-28 LAB — PREPARE RBC (CROSSMATCH)

## 2019-05-28 MED ORDER — VITAL HIGH PROTEIN PO LIQD
1000.0000 mL | ORAL | Status: AC
  Administered 2019-05-28: 1000 mL

## 2019-05-28 MED ORDER — DEXTROSE 50 % IV SOLN
1.0000 | Freq: Once | INTRAVENOUS | Status: AC
  Administered 2019-05-28: 50 mL via INTRAVENOUS

## 2019-05-28 MED ORDER — SODIUM ZIRCONIUM CYCLOSILICATE 5 G PO PACK
5.0000 g | PACK | Freq: Every day | ORAL | Status: DC
  Filled 2019-05-28: qty 1

## 2019-05-28 MED ORDER — SODIUM ZIRCONIUM CYCLOSILICATE 10 G PO PACK
10.0000 g | PACK | Freq: Three times a day (TID) | ORAL | Status: AC
  Administered 2019-05-28 – 2019-05-29 (×3): 10 g via ORAL
  Filled 2019-05-28 (×3): qty 1

## 2019-05-28 MED ORDER — SODIUM CHLORIDE 0.9 % IV SOLN
2.0000 g | INTRAVENOUS | Status: DC
  Administered 2019-05-29 – 2019-05-30 (×2): 2 g via INTRAVENOUS
  Filled 2019-05-28 (×2): qty 2

## 2019-05-28 MED ORDER — DEXTROSE 50 % IV SOLN
25.0000 mL | Freq: Once | INTRAVENOUS | Status: AC
  Administered 2019-05-28: 25 mL via INTRAVENOUS
  Filled 2019-05-28: qty 50

## 2019-05-28 MED ORDER — DEXTROSE 50 % IV SOLN
INTRAVENOUS | Status: AC
  Filled 2019-05-28: qty 50

## 2019-05-28 MED ORDER — HEPARIN (PORCINE) 25000 UT/250ML-% IV SOLN
1050.0000 [IU]/h | INTRAVENOUS | Status: DC
  Administered 2019-05-28: 1000 [IU]/h via INTRAVENOUS
  Administered 2019-05-29 – 2019-05-30 (×2): 1150 [IU]/h via INTRAVENOUS
  Filled 2019-05-28 (×3): qty 250

## 2019-05-28 MED ORDER — AMIODARONE HCL IN DEXTROSE 360-4.14 MG/200ML-% IV SOLN
60.0000 mg/h | INTRAVENOUS | Status: AC
  Administered 2019-05-28 (×2): 60 mg/h via INTRAVENOUS
  Filled 2019-05-28: qty 200

## 2019-05-28 MED ORDER — DEXTROSE 50 % IV SOLN
25.0000 g | Freq: Once | INTRAVENOUS | Status: AC
  Administered 2019-05-28: 25 g via INTRAVENOUS
  Filled 2019-05-28: qty 50

## 2019-05-28 MED ORDER — PANTOPRAZOLE SODIUM 40 MG PO PACK
40.0000 mg | PACK | Freq: Every day | ORAL | Status: DC
  Administered 2019-05-28 – 2019-05-29 (×2): 40 mg
  Filled 2019-05-28 (×2): qty 20

## 2019-05-28 MED ORDER — VANCOMYCIN HCL 1250 MG/250ML IV SOLN
1250.0000 mg | INTRAVENOUS | Status: DC
  Filled 2019-05-28: qty 250

## 2019-05-28 MED ORDER — SODIUM CHLORIDE 0.9% IV SOLUTION: Freq: Once | INTRAVENOUS | Status: DC

## 2019-05-28 MED ORDER — PRO-STAT SUGAR FREE PO LIQD
30.0000 mL | Freq: Two times a day (BID) | ORAL | Status: DC
  Administered 2019-05-28 – 2019-05-29 (×2): 30 mL
  Filled 2019-05-28 (×2): qty 30

## 2019-05-28 MED ORDER — BACLOFEN 10 MG PO TABS
10.0000 mg | ORAL_TABLET | Freq: Three times a day (TID) | ORAL | Status: DC
  Administered 2019-05-28 – 2019-05-29 (×3): 10 mg
  Filled 2019-05-28 (×6): qty 1

## 2019-05-28 MED ORDER — AMIODARONE HCL IN DEXTROSE 360-4.14 MG/200ML-% IV SOLN
30.0000 mg/h | INTRAVENOUS | Status: DC
  Administered 2019-05-28 – 2019-05-30 (×4): 30 mg/h via INTRAVENOUS
  Filled 2019-05-28 (×4): qty 200

## 2019-05-28 NOTE — Progress Notes (Signed)
ANTICOAGULATION CONSULT NOTE - Initial Consult  Pharmacy Consult for Heparin Indication: atrial fibrillation  Allergies  Allergen Reactions  . Iodinated Diagnostic Agents Hives, Itching and Other (See Comments)    Other Reaction: stinging  . Doxycycline Nausea And Vomiting  . Empagliflozin     DIZZINESS, HEACAHES  Other reaction(s): headache "Jardiance"    Patient Measurements: Height: '5\' 10"'  (177.8 cm) Weight: 176 lb (79.8 kg) IBW/kg (Calculated) : 73  Vital Signs: Temp: 98.4 F (36.9 C) (01/03 0400) Temp Source: Axillary (01/03 0400) BP: 111/63 (01/03 0500) Pulse Rate: 107 (01/03 0500)  Labs: Recent Labs    06/14/2019 1106 05/27/19 0147 05/27/19 0526 05/27/19 1756 05/28/19 0103  HGB 10.0* 9.3* 9.9*  --  6.2*  HCT 32.2* 31.1* 29.0*  --  20.1*  PLT 352 348  --   --  131*  CREATININE 1.31* 1.52*  --  2.27* 2.31*  TROPONINIHS 12 17  --  47* 41*    Estimated Creatinine Clearance: 28.1 mL/min (A) (by C-G formula based on SCr of 2.31 mg/dL (H)).   Medical History: Past Medical History:  Diagnosis Date  . Acute idiopathic pericarditis 2008   s/p fluoroscopic pericardial window at [facility]  . Childhood asthma   . Chronic kidney disease    STAGE 2 CKD FOLLOWED BY PRIMARY DR Wynelle Link  . Chronic lower back pain   . Chronic sinusitis   . Duodenal ulcer, chronic   . GERD (gastroesophageal reflux disease)   . Headache    "at least once/week; sometimes more" (07/09/2015)  . History of kidney stones 1960'S  . Hyperlipidemia   . Hypothyroidism   . Left trigger finger   . Lung cancer ([place]) 1995   s/p left pneumonectomy in 1995; also treated with radiation  . Normal echocardiogram 08/2011  . Normal nuclear stress test 08/2011   EF was slightly down but normal by echo; no ischemia or scar noted.   Marland Kitchen NSVT (nonsustained ventricular tachycardia) ([place])   . PAF (paroxysmal atrial fibrillation) ([place]) 10 YRS AGO NOT CURRENT PROBLEM   s/p ablation at [facility]  . Type II diabetes  mellitus (HCC)     Medications:  Scheduled:  . sodium chloride   Intravenous Once  . amiodarone  150 mg Intravenous Once  . baclofen  10 mg Oral TID  . chlorhexidine gluconate (MEDLINE KIT)  15 mL Mouth Rinse BID  . Chlorhexidine Gluconate Cloth  6 each Topical Daily  . Chlorhexidine Gluconate Cloth  6 each Topical Q0600  . insulin aspart  0-9 Units Subcutaneous TID WC  . insulin glargine  12 Units Subcutaneous QHS  . levothyroxine  100 mcg Oral Q0600  . mouth rinse  15 mL Mouth Rinse 10 times per day  . pantoprazole  40 mg Oral Daily  . sodium chloride flush  3 mL Intravenous Once    Assessment: 79 y.o. male with h/o Afib, Eliquis on hold, for heparin.  Last dose of Eliquis at 10 pm last night  Goal of Therapy:  APTT 66-102 sec Heparin level 0.3-0.7 units/ml Monitor platelets by anticoagulation protocol: Yes   Plan:  Start heparin 1000 units/hr at 10 am  APTT in 8 hours  Kavitha Lansdale, [place] Curb 05/28/2019,6:36 AM

## 2019-05-28 NOTE — Progress Notes (Signed)
[place] Progress Note Patient Name: Zachary Bean DOB: 22-Apr-1941 MRN: [id number]   Date of Service  05/28/2019  HPI/Events of Note  Hgb 9.2 from recent lab but ordered for blood transfusion.  Reviewed KUB. Feeding tube in appropriate position for use.  eICU Interventions  Feeding tube OK for use. Canceled order for blood transfusion because not required at this time.     Intervention Category Intermediate Interventions: Other:  Charlott Rakes 05/28/2019, 9:34 PM

## 2019-05-28 NOTE — Procedures (Signed)
Central Venous Catheter Insertion Procedure Note Zachary Bean, Zachary Bean [id number] [birth year]  Procedure: Insertion of Central Venous Catheter Indications: Drug and/or fluid administration and Frequent blood sampling  Procedure Details Consent: Risks of procedure as well as the alternatives and risks of each were explained to the (patient/caregiver).  Consent for procedure obtained. Time Out: Verified patient identification, verified procedure, site/side was marked, verified correct patient position, special equipment/implants available, medications/allergies/relevent history reviewed, required imaging and test results available.  Performed  Maximum sterile technique was used including antiseptics, cap, gloves, gown, hand hygiene, mask and sheet. Skin prep: Chlorhexidine; local anesthetic administered A antimicrobial bonded/coated triple lumen catheter was placed in the right internal jugular vein using the Seldinger technique.  Evaluation Blood flow good Complications: No apparent complications Patient did tolerate procedure well. Chest X-ray ordered to verify placement.  CXR: normal.  [name], MD Internal Medicine, PGY-1 05/27/2018, 6:52 PM

## 2019-05-28 NOTE — Progress Notes (Signed)
  Echocardiogram 2D Echocardiogram has been performed.  Zachary Bean 05/28/2019, 3:47 PM

## 2019-05-28 NOTE — Progress Notes (Signed)
PCCM Interval Note  Discussed goals of care with wife. Patient would want to continue medical management including pressors and meds but would not want heroic measures. NO CPR. NO DIALYSIS.  Will update code status. Will continue goals of care pending patient's response to treatment  Rodman Pickle, M.D. [facility] Pulmonary/Critical Care Medicine 05/28/2019 4:39 PM

## 2019-05-28 NOTE — Progress Notes (Signed)
Pharmacy Antibiotic Note  Zachary Bean is a 79 y.o. male admitted on 06/03/2019 with chest pain.  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  Patient is having foul smelling output from recent lobectomy.  Pharmacy has been consulted for vancomycin and cefepime dosing for PNA.  Noted documentation of patient being on Zyvox and to complete treatment course soon.   SCr increased to 2.31, CrCl <30 mL/min, wound culture with pseudomonas, rare GPC and GPR. Susceptibilities still pending. WBC remains WNL and pt afebrile.  Plan: Reduce vancomycin to 1250 mg IV q36h for AUC 523 using SCr 2.31.  Reduce cefepime to 2gm IV Q24H Monitor renal fxn, clinical progress, vanc AUC as indicated  Height: 5\' 10"  (177.8 cm) Weight: 176 lb (79.8 kg) IBW/kg (Calculated) : 73  Temp (24hrs), Avg:98.1 F (36.7 C), Min:97.7 F (36.5 C), Max:98.6 F (37 C)  Recent Labs  Lab 06/02/2019 1106 06/22/2019 1736 05/27/19 0147 05/27/19 0543 05/27/19 0740 05/27/19 1754 05/27/19 1756 05/27/19 2054 05/28/19 0103  WBC 7.4  --  9.8  --   --   --   --   --  5.2  CREATININE 1.31*  --  1.52*  --   --   --  2.27*  --  2.31*  LATICACIDVEN  --  1.9  --  8.1* 8.2* 6.2*  --  4.8*  --     Estimated Creatinine Clearance: 28.1 mL/min (A) (by C-G formula based on SCr of 2.31 mg/dL (H)).    Allergies  Allergen Reactions  . Iodinated Diagnostic Agents Hives, Itching and Other (See Comments)    Other Reaction: stinging  . Doxycycline Nausea And Vomiting  . Empagliflozin     DIZZINESS, HEACAHES  Other reaction(s): headache "Jardiance"    Zyvox PTA for several months, to complete course in a few days Augmentin PTA 12/11 >> ?12/24 per [facility] note 11/19  Vanc 1/1 >> Cefepime 1/1 >> Flagyl x1 1/1  1/1 covid - neg 1/1 left lung fluid/wound cx - abundant pseudomonas, GPC, GPR - pending  1/1 BCx - ngtd 1/1 MRSA - neg    Vertis Kelch, PharmD, [facility] PGY2 Cardiology Pharmacy Resident Phone [phone] 05/28/2019       10:15 AM  Please check [email or web address] for unit-specific pharmacist phone numbers

## 2019-05-28 NOTE — Progress Notes (Addendum)
[place] for Heparin Indication: atrial fibrillation  Allergies  Allergen Reactions  . Iodinated Diagnostic Agents Hives, Itching and Other (See Comments)    Other Reaction: stinging  . Doxycycline Nausea And Vomiting  . Empagliflozin     DIZZINESS, HEACAHES  Other reaction(s): headache "Jardiance"    Patient Measurements: Height: '5\' 10"'  (177.8 cm) Weight: 176 lb (79.8 kg) IBW/kg (Calculated) : 73  Vital Signs: Temp: 97.7 F (36.5 C) (01/03 1608) Temp Source: Oral (01/03 1608) BP: 109/58 (01/03 1230) Pulse Rate: 65 (01/03 1230)  Labs: Recent Labs    05/27/19 0147 05/27/19 0147 05/27/19 0526 05/27/19 1756 05/28/19 0103 05/28/19 1913  HGB 9.3*   < > 9.9*  --  6.2* 9.2*  HCT 31.1*  --  29.0*  --  20.1* 30.1*  PLT 348  --   --   --  131* 157  APTT  --   --   --   --   --  44*  CREATININE 1.52*  --   --  2.27* 2.31* 3.14*  TROPONINIHS 17  --   --  47* 41*  --    < > = values in this interval not displayed.    Estimated Creatinine Clearance: 20.7 mL/min (A) (by C-G formula based on SCr of 3.14 mg/dL (H)).   Medical History: Past Medical History:  Diagnosis Date  . Acute idiopathic pericarditis 2008   s/p fluoroscopic pericardial window at [facility]  . Childhood asthma   . Chronic kidney disease    STAGE 2 CKD FOLLOWED BY PRIMARY DR Wynelle Link  . Chronic lower back pain   . Chronic sinusitis   . Duodenal ulcer, chronic   . GERD (gastroesophageal reflux disease)   . Headache    "at least once/week; sometimes more" (07/09/2015)  . History of kidney stones 1960'S  . Hyperlipidemia   . Hypothyroidism   . Left trigger finger   . Lung cancer ([place]) 1995   s/p left pneumonectomy in 1995; also treated with radiation  . Normal echocardiogram 08/2011  . Normal nuclear stress test 08/2011   EF was slightly down but normal by echo; no ischemia or scar noted.   Marland Kitchen NSVT (nonsustained ventricular tachycardia) ([place])   . PAF (paroxysmal  atrial fibrillation) ([place]) 10 YRS AGO NOT CURRENT PROBLEM   s/p ablation at [facility]  . Type II diabetes mellitus (HCC)     Medications:  Scheduled:  . sodium chloride   Intravenous Once  . baclofen  10 mg Per Tube TID  . chlorhexidine gluconate (MEDLINE KIT)  15 mL Mouth Rinse BID  . Chlorhexidine Gluconate Cloth  6 each Topical Daily  . Chlorhexidine Gluconate Cloth  6 each Topical Q0600  . feeding supplement (PRO-STAT SUGAR FREE 64)  30 mL Per Tube BID  . feeding supplement (VITAL HIGH PROTEIN)  1,000 mL Per Tube Q24H  . insulin aspart  0-9 Units Subcutaneous TID WC  . insulin glargine  12 Units Subcutaneous QHS  . levothyroxine  100 mcg Oral Q0600  . mouth rinse  15 mL Mouth Rinse 10 times per day  . pantoprazole sodium  40 mg Per Tube Daily  . sodium chloride flush  3 mL Intravenous Once  . sodium zirconium cyclosilicate  10 g Oral TID   Followed by  . [START ON 05/30/2019] sodium zirconium cyclosilicate  5 g Oral Daily    Assessment: 79 y.o. male with h/o Afib, on (Eliquis PTA), pharmacy dosing heparin.  Last  dose of Eliquis at 10 pm on 1/2 -aPTT= 44  Heparin has been off for line placement. Heparin was just restarted per RN  Goal of Therapy:  APTT 66-102 sec Heparin level 0.3-0.7 units/ml Monitor platelets by anticoagulation protocol: Yes   Plan:  -No heparin changes now -Will check heparin level, aPTT and CBC in am  Hildred Laser, PharmD Clinical Pharmacist **Pharmacist phone directory can now be found on [email or web address] ([email or web address]).  Listed under [place].

## 2019-05-28 NOTE — Progress Notes (Signed)
Hypoglycemic Event  CBG: 63  Treatment: 1 amp of Dextrose 50% Symptoms: None  Follow-up CBG: Time:0545 CBG Result: 118  Possible Reasons for Event: NPO  Comments/MD notified:Will continue to monitor    Orpah Greek

## 2019-05-28 NOTE — Progress Notes (Signed)
Cardiology Progress Note  Patient ID: Zachary Bean MRN: [id number] DOB: 07/20/1940 Date of Encounter: 05/28/2019  Primary Cardiologist: No primary care provider on file.  Subjective  Pressors being weaned. Back in Afib overnight and in NSR this AM. Remains with significant LE edema.   ROS:  All other ROS reviewed and negative. Pertinent positives noted in the HPI.     Inpatient Medications  Scheduled Meds: . sodium chloride   Intravenous Once  . amiodarone  150 mg Intravenous Once  . baclofen  10 mg Oral TID  . chlorhexidine gluconate (MEDLINE KIT)  15 mL Mouth Rinse BID  . Chlorhexidine Gluconate Cloth  6 each Topical Daily  . Chlorhexidine Gluconate Cloth  6 each Topical Q0600  . insulin aspart  0-9 Units Subcutaneous TID WC  . insulin glargine  12 Units Subcutaneous QHS  . levothyroxine  100 mcg Oral Q0600  . mouth rinse  15 mL Mouth Rinse 10 times per day  . pantoprazole  40 mg Oral Daily  . sodium chloride flush  3 mL Intravenous Once   Continuous Infusions: . sodium chloride 250 mL (05/27/19 1508)  . amiodarone    . ceFEPime (MAXIPIME) IV 2 g (05/28/19 0851)  . fentaNYL infusion INTRAVENOUS 75 mcg/hr (05/28/19 0800)  . heparin    . norepinephrine (LEVOPHED) Adult infusion 1 mcg/min (05/28/19 0800)  . sodium chloride    . vancomycin     PRN Meds: acetaminophen, fentaNYL, glucagon (human recombinant), lubiprostone   Vital Signs   Vitals:   05/28/19 0915 05/28/19 0930 05/28/19 0945 05/28/19 1000  BP: (!) 126/59 (!) 124/59 125/76 (!) 126/53  Pulse: 72  72 71  Resp: _0 Temp:      TempSrc:      SpO2: 100%  100% 100%  Weight:      Height:        Intake/Output Summary (Last 24 hours) at 05/28/2019 1004 Last data filed at 05/28/2019 0800 Gross per 24 hour  Intake 743.65 ml  Output 705 ml  Net 38.65 ml   Last 3 Weights 05/30/2019 03/20/2019 02/21/2019  Weight (lbs) 176 lb 175 lb 180 lb 9.6 oz  Weight (kg) 79.833 kg 79.379 kg 81.92 kg      Telemetry    Overnight telemetry shows afib and now NSR, which I personally reviewed.   Physical Exam   Vitals:   05/28/19 0915 05/28/19 0930 05/28/19 0945 05/28/19 1000  BP: (!) 126/59 (!) 124/59 125/76 (!) 126/53  Pulse: 72  72 71  Resp: _1 Temp:      TempSrc:      SpO2: 100%  100% 100%  Weight:      Height:         Intake/Output Summary (Last 24 hours) at 05/28/2019 1004 Last data filed at 05/28/2019 0800 Gross per 24 hour  Intake 743.65 ml  Output 705 ml  Net 38.65 ml    Last 3 Weights 06/07/2019 03/20/2019 02/21/2019  Weight (lbs) 176 lb 175 lb 180 lb 9.6 oz  Weight (kg) 79.833 kg 79.379 kg 81.92 kg    Body mass index is 25.25 kg/m.  General: intubated on vent  Head: Atraumatic, normal size  Eyes: PEERLA, EOMI  Neck: JVD 12-15 cm H20 Endocrine: No thryomegaly Cardiac: Normal S1, S2; RRR; no murmurs, rubs, or gallops Lungs: diminished breath sounds bilaterally  Abd: Soft, nontender, no hepatomegaly  Ext: 2+ pitting edema  Musculoskeletal: No deformities, BUE and BLE strength normal  and equal Skin: L chest wound   Neuro: intubated on vent   Labs  High Sensitivity Troponin:   Recent Labs  Lab 06/22/2019 1106 06/18/2019 1306 05/27/19 0147 05/27/19 1756 05/28/19 0103  TROPONINIHS _0 47* 41*     Cardiac EnzymesNo results for input(s): TROPONINI in the last 168 hours. No results for input(s): TROPIPOC in the last 168 hours.  Chemistry Recent Labs  Lab 06/06/2019 1106 05/27/19 0147 05/27/19 0526 05/27/19 1756 05/28/19 0103  NA 129* 127* 129* 132* 130*  K 4.7 5.7* 5.1 5.5* 5.4*  CL 92* 93*  --  97* 101  CO2 27 18*  --  18* 18*  GLUCOSE 177* 209*  --  152* 102*  BUN 17 19  --  28* 32*  CREATININE 1.31* 1.52*  --  2.27* 2.31*  CALCIUM 8.4* 7.7*  --  7.9* 7.3*  PROT 6.8  --   --  5.9*  --   ALBUMIN 1.9*  --   --  1.7*  --   AST 21  --   --  3,578*  --   ALT 18  --   --  1,576*  --   ALKPHOS 122  --   --  189*  --   BILITOT 0.6  --   --  1.0  --   GFRNONAA  53* 44*  --  27* 26*  GFRAA >60 51*  --  31* 31*  ANIONGAP 10 16*  --  17* 11    Hematology Recent Labs  Lab 06/13/2019 1106 05/27/19 0147 05/27/19 0526 05/28/19 0103  WBC 7.4 9.8  --  5.2  RBC 3.48* 3.24*  --  2.12*  HGB 10.0* 9.3* 9.9* 6.2*  HCT 32.2* 31.1* 29.0* 20.1*  MCV 92.5 96.0  --  94.8  MCH 28.7 28.7  --  29.2  MCHC 31.1 29.9*  --  30.8  RDW 14.6 14.6  --  15.2  PLT 352 348  --  131*   BNP Recent Labs  Lab 06/17/2019 1106 05/27/19 2054  BNP 175.6* 335.1*    DDimer No results for input(s): DDIMER in the last 168 hours.   Radiology  DG Chest 2 View  Result Date: 06/09/2019 CLINICAL DATA:  Chest pain. Status post lobectomy. EXAM: CHEST - 2 VIEW COMPARISON:  January 16, 2019. FINDINGS: Stable cardiomediastinal silhouette. No pneumothorax is noted. Right lung is clear. Postsurgical changes are noted in the left lung base. Left basilar opacity is noted concerning for pneumonia or atelectasis with associated effusion. Bony thorax is unremarkable. IMPRESSION: Postsurgical changes are noted in left lung base. Left basilar opacity is noted concerning for pneumonia or atelectasis with associated effusion. Electronically Signed   By: Marijo Conception M.D.   On: 06/21/2019 11:48   CT Chest Wo Contrast  Result Date: 06/22/2019 CLINICAL DATA:  Left pneumonectomy 25 years ago secondary to cancer. Exploratory surgery 5 weeks ago at outside hospital secondary to fistula between the bronchus and esophagus. Substernal chest pain. Shortness of breath. Increased drainage. EXAM: CT CHEST WITHOUT CONTRAST TECHNIQUE: Multidetector CT imaging of the chest was performed following the standard protocol without IV contrast. COMPARISON:  Chest radiograph earlier today.  03/09/2019 chest CT. FINDINGS: Cardiovascular: Aortic and branch vessel atherosclerosis. Tortuous thoracic aorta. Normal heart size, without pericardial effusion. Multivessel coronary artery atherosclerosis. Mediastinum/Nodes: No  supraclavicular adenopathy. No mediastinal or definite hilar adenopathy, given limitations of unenhanced CT. Normal appearance of the esophagus, without communication to the bronchus or pneumonectomy bed  identified. Lungs/Pleura: Small right pleural fluid is new since the prior CT. Calcified right upper lobe granuloma. Posterior right upper lobe mild airspace and ground-glass opacity is new, including on 64/6. Subpleural right lower lobe 4 mm pulmonary nodule on 90/6 is new since the prior, most likely a subpleural lymph node. Mild centrilobular emphysema. Left pneumonectomy. Interval evacuation of the fluid within the pneumonectomy bed. There is gas and presumed packing material within the operative bed. Presumed postsurgical communication to the skin surface superiorly on 37/3 and more inferiorly and laterally on 47/3. Upper Abdomen: Mild hepatic steatosis. Normal imaged portions of the spleen, stomach, pancreas, gallbladder, adrenal glands, left kidney. Musculoskeletal: Postsurgical left rib defects. Moderate mid and lower thoracic spondylosis. IMPRESSION: 1. Since 03/09/2019, drainage of the left pneumonectomy bed fluid, with placement of presumed surgical packing material. Postsurgical communication of the operative bed to the posterolateral left chest wall. 2. New small right pleural effusion with inferior right upper lobe airspace disease, suspicious for mild infection or aspiration. 3. Aortic atherosclerosis (ICD10-I70.0), coronary artery atherosclerosis and emphysema (ICD10-J43.9). 4. Hepatic steatosis. Electronically Signed   By: Abigail Miyamoto M.D.   On: 06/18/2019 14:51   DG CHEST PORT 1 VIEW  Result Date: 05/27/2019 CLINICAL DATA:  Endotracheally intubated. EXAM: PORTABLE CHEST 1 VIEW COMPARISON:  Radiograph earlier this day. Chest CT yesterday. FINDINGS: Endotracheal tube tip 4.9 cm from the carina. History of left pneumonectomy. Packing material and air in the left hemithorax, better characterized on  CT yesterday. Right costophrenic angle not included in the field of view, right pleural effusion on CT is not well seen. Minimal patchy opacity in the periphery of the right mid lung corresponds to airspace disease in the right upper lobe. No right pneumothorax. Normal heart size with unchanged mediastinal contours. IMPRESSION: 1. Endotracheal tube tip 4.9 cm from the carina. 2. Prior left pneumonectomy. Packing material and air in the left hemithorax, better characterized on CT yesterday. 3. Minimal patchy opacity in the periphery of the right mid lung corresponds to airspace disease in the right upper lobe on CT. Electronically Signed   By: Keith Rake M.D.   On: 05/27/2019 04:11   DG CHEST PORT 1 VIEW  Result Date: 05/27/2019 CLINICAL DATA:  Shortness of breath EXAM: PORTABLE CHEST 1 VIEW COMPARISON:  06/17/2019, 06/17/2018, CT chest 06/22/2019, 03/09/2019, radiograph 01/16/2019 FINDINGS: Patchy foci of airspace disease in the right mid lung and right base. Interval increased lucency in the left thorax. Patient has a history of left pneumonectomy. Mild opacity at the left lung base remains. Mild shift to the left. Stable cardiomediastinal silhouette. IMPRESSION: 1. Status post left pneumonectomy. Interval increased lucency in the left thorax compared to most recent prior radiograph, possibly due to evacuation of previously noted material within the left chest cavity on chest CT 06/11/2019, and reflecting air within the left pleural cavity. Mild opacity remains at the left base. 2. Small foci of airspace disease in the right mid lung and right base, could reflect small foci of pneumonia Electronically Signed   By: Donavan Foil M.D.   On: 05/27/2019 01:35   DG Abd Portable 1V  Result Date: 05/27/2019 CLINICAL DATA:  OG tube placement EXAM: PORTABLE ABDOMEN - 1 VIEW COMPARISON:  10/04/2014 FINDINGS: Esophageal tube tip and side-port project over the proximal stomach. Mild gaseous dilatation of colon  without convincing evidence for obstruction. Moderate stool in the colon. IMPRESSION: Esophageal tube tip and side port overlie the proximal stomach Electronically Signed   By: Maudie Mercury  Francoise Ceo M.D.   On: 05/27/2019 21:30    Patient Profile  Winthrop Shannahan is a 79 y.o. male with Amond Speranza is a 79 y.o. 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 Overnight 1/2 he suffered a PEA arrest likely related to hypoxic respiratory failure. Was noted to have bradycardia around the time of arrest. Course complicated by acute kidney injury, acute liver injury, afib with RVR in setting of cardiac arrest.   Assessment & Plan  1. PEA arrest -occurred 1/2 3:23 AM and significant hypoxia/increased WOB noted around time of arrest. Suspect bradycardia reported was secondary to hypoxia.  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 Atrial fibrillation with left atrial appendage thrombus -In November at [facility] he was noted to have a left atrial appendage thrombus on his transesophageal echocardiogram. They simply recommended rate control as well as Eliquis. -afib is 2/2 to lung issues  -in Afib on admission and back in SR yesterday. Had more afib overnight -would continue amiodarone to maintain SR -due to liver injury will hold eliquis and start heparin drip -can go back to eliquis once liver injury resolve    For questions or updates, please contact [place] Please consult [email or web address] for contact info under   Signed, [place] T. Audie Box, [place]  05/28/2019 10:04 AM

## 2019-05-28 NOTE — Progress Notes (Addendum)
PULMONARY / CRITICAL CARE MEDICINE    Name: Zachary Bean MRN: [id number] DOB: 06/19/1940    ADMISSION DATE:  06/16/2019 CONSULTATION DATE:  05/27/19  CHIEF COMPLAINT:  Pulmonary sepsis  HISTORY OF PRESENT ILLNESS:    79 yo male PMH lung cancer s/p left pneumonectomy 25 years prior, complicated by pericardial effusion and left bronchopleural fistula, he has had pericardial and pleural windows placed.  Recently hospitalized at [facility] where he was found to have AFRVR was cardioverted also noted to have LAA thrombus and MSSA infection around pleural window.  He presented here with septic shock from pneumonia and cardiac arrest PCCM was consulted for post cardiac arrest care and pulmonary sepsis management.    PAST MEDICAL HISTORY :  He  has a past medical history of Acute idiopathic pericarditis (2008), Childhood asthma, Chronic kidney disease, Chronic lower back pain, Chronic sinusitis, Duodenal ulcer, chronic, GERD (gastroesophageal reflux disease), Headache, History of kidney stones (1960'S), Hyperlipidemia, Hypothyroidism, Left trigger finger, Lung cancer ([place]) (1995), Normal echocardiogram (08/2011), Normal nuclear stress test (08/2011), NSVT (nonsustained ventricular tachycardia) ([place]), PAF (paroxysmal atrial fibrillation) ([place]) (10 YRS AGO NOT CURRENT PROBLEM), and Type II diabetes mellitus ([place]).  PAST SURGICAL HISTORY: He  has a past surgical history that includes Pneumonectomy (Left, 1995); Lumbar microdiscectomy (1984); Back surgery; Cataract extraction w/ intraocular lens  implant, bilateral (Bilateral); Cardiac catheterization (N/A, 07/09/2015); Atrial fibrillation ablation (early 2000s); LEFT LUNG REMOVED ; Inguinal hernia repair (Left, 2000); SKIN CANCER AREAS REMOVED FROM EAR AND CHEST; Inguinal hernia repair (Right, 11/11/2017); and Insertion of mesh (Right, 11/11/2017).  Allergies  Allergen Reactions  . Iodinated Diagnostic Agents Hives, Itching and Other (See Comments)    Other Reaction:  stinging  . Doxycycline Nausea And Vomiting  . Empagliflozin     DIZZINESS, HEACAHES  Other reaction(s): headache "[place]"    No current facility-administered medications on file prior to encounter.   Current Outpatient Medications on File Prior to Encounter  Medication Sig  . acetaminophen (TYLENOL) 500 MG tablet Take 500 mg by mouth every 6 (six) hours as needed for moderate pain or headache.  Marland Kitchen amiodarone (PACERONE) 200 MG tablet Take 200 mg by mouth daily.  . baclofen (LIORESAL) 10 MG tablet Take 10 mg by mouth 3 (three) times daily. PRN  . budesonide-formoterol (SYMBICORT) 160-4.5 MCG/ACT inhaler Inhale 2 puffs into the lungs 2 (two) times daily as needed (for shortness of breath).  . cholecalciferol (VITAMIN D3) 25 MCG (1000 UT) tablet Take 1,000 Units by mouth daily.  . Cyanocobalamin (VITAMIN B 12 PO) Take 1 tablet by mouth daily.  Marland Kitchen diltiazem (CARDIZEM CD) 360 MG 24 hr capsule Take 360 mg by mouth daily.  Marland Kitchen ELIQUIS 5 MG TABS tablet Take 5 mg by mouth 2 (two) times daily.  Marland Kitchen esomeprazole (NEXIUM) 40 MG capsule Take 40 mg by mouth daily at 12 noon.  Marland Kitchen glimepiride (AMARYL) 2 MG tablet Take 2 mg by mouth daily.  Marland Kitchen HYSEPT 0.5 % SOLN Apply 1 application topically daily.   . Insulin Aspart FlexPen 100 UNIT/ML SOPN Inject 4 Units into the skin 3 (three) times daily.  Marland Kitchen LANTUS SOLOSTAR 100 UNIT/ML Solostar Pen Inject 12 Units into the skin daily.  Marland Kitchen levothyroxine (SYNTHROID) 100 MCG tablet Take 100 mcg by mouth daily before breakfast.  . linezolid (ZYVOX) 600 MG tablet Take 600 mg by mouth 2 (two) times daily. For 7 days  . losartan (COZAAR) 50 MG tablet Take 25 mg by mouth daily.   Marland Kitchen lubiprostone (AMITIZA) 8  MCG capsule Take 8 mcg by mouth 2 (two) times daily with a meal. PRN  . metoprolol (LOPRESSOR) 50 MG tablet Take 50 mg by mouth daily.  . metoprolol succinate (TOPROL-XL) 25 MG 24 hr tablet Take 75 mg by mouth 2 (two) times daily.  . nitroGLYCERIN (NITROSTAT) 0.4 MG SL tablet  Place 1 tablet (0.4 mg total) under the tongue every 5 (five) minutes as needed for chest pain.  . ONE TOUCH ULTRA TEST test strip   . OVER THE COUNTER MEDICATION Place 2 drops into both ears daily as needed (for ear aches). [place] Ear Ache Drops  . oxyCODONE (OXY IR/ROXICODONE) 5 MG immediate release tablet Take 5 mg by mouth every 4 (four) hours as needed for pain.  . traMADol (ULTRAM) 50 MG tablet Take 1 tablet (50 mg total) by mouth every 6 (six) hours as needed for moderate pain or severe pain.    FAMILY HISTORY:  His He indicated that his mother is deceased. He indicated that his father is deceased. He indicated that his sister is deceased. He indicated that his brother is deceased. He indicated that his daughter is alive. He indicated that his son is alive.   SOCIAL HISTORY: He  reports that he quit smoking about 25 years ago. His smoking use included cigarettes. He has a 40.00 pack-year smoking history. He has quit using smokeless tobacco.  His smokeless tobacco use included chew. He reports previous alcohol use. He reports that he does not use drugs.  REVIEW OF SYSTEMS:   PT intubated  SUBJECTIVE:  Overnight nurse reports pt weaning off levo and switched to 37 degrees per warming protocol.    VITAL SIGNS: BP (!) 106/50   Pulse 88   Temp 98.4 F (36.9 C) (Axillary)   Resp 16   Ht 5\' 10"  (1.778 m)   Wt 79.8 kg   SpO2 100%   BMI 25.25 kg/m   HEMODYNAMICS:    VENTILATOR SETTINGS: Vent Mode: PRVC FiO2 (%):  [40 %] 40 % Set Rate:  [16 bmp] 16 bmp Vt Set:  [500 mL] 500 mL PEEP:  [5 cmH20] 5 cmH20 Plateau Pressure:  [22 cmH20-27 cmH20] 22 cmH20  INTAKE / OUTPUT: I/O last 3 completed shifts: In: 2978 [I.V.:1134; NG/GT:95; IV Piggyback:1749] Out: 705 [Urine:705]  PHYSICAL EXAMINATION: General:  Cachetic chronically ill appearing male Neuro:  Intubated, sedated, later this morning was following commands giving thumbs up sign to answer questions HEENT:  ET tube in  place Cardiovascular:  Irregularly irregular, normal rate, no MRG Lungs:  Right lung clear, left lung decreased BS at base,  Abdomen:  Soft, non distended Skin: wound from window with serosanguinous drainage present on bed, some scant purulence and purulence on packing material with interval decrease per nursing.  Interval increase of blood on packing material see below  Media Information   Document Information  Photos    05/28/2019 09:13  Attached To:  Hospital Encounter on 06/13/2019  Source Information  Katherine Roan, MD  [facility]   Media Information   Document Information  Photos    05/28/2019 09:13  Attached To:  Hospital Encounter on 06/07/2019  Source Information  Katherine Roan, MD  [facility]     LABS:  BMET Recent Labs  Lab 05/27/19 0147 05/27/19 0526 05/27/19 1756 05/28/19 0103  NA 127* 129* 132* 130*  K 5.7* 5.1 5.5* 5.4*  CL 93*  --  97* 101  CO2 18*  --  18* 18*  BUN 19  --  28* 32*  CREATININE 1.52*  --  2.27* 2.31*  GLUCOSE 209*  --  152* 102*    Electrolytes Recent Labs  Lab 05/27/19 0147 05/27/19 1756 05/28/19 0103  CALCIUM 7.7* 7.9* 7.3*  MG 2.2 1.9 1.9  PHOS 4.8*  --   --     CBC Recent Labs  Lab 05/29/2019 1106 05/27/19 0147 05/27/19 0526 05/28/19 0103  WBC 7.4 9.8  --  5.2  HGB 10.0* 9.3* 9.9* 6.2*  HCT 32.2* 31.1* 29.0* 20.1*  PLT 352 348  --  131*    Coag's No results for input(s): APTT, INR in the last 168 hours.  Sepsis Markers Recent Labs  Lab 05/27/19 0740 05/27/19 1754 05/27/19 2054  LATICACIDVEN 8.2* 6.2* 4.8*    ABG Recent Labs  Lab 05/27/19 0105 05/27/19 0526  PHART 7.362 7.240*  PCO2ART 30.7* 47.9  PO2ART 138* 419.0*    Liver Enzymes Recent Labs  Lab 06/22/2019 1106 05/27/19 1756  AST 21 3,578*  ALT 18 1,576*  ALKPHOS 122 189*  BILITOT 0.6 1.0  ALBUMIN 1.9* 1.7*    Cardiac Enzymes No results for input(s): TROPONINI, PROBNP in the last 168  hours.  Glucose Recent Labs  Lab 05/27/19 1536 05/27/19 1954 05/27/19 2338 05/28/19 0538 05/28/19 0634 05/28/19 0727  GLUCAP 148* 129* 104* 63* 118* 92    Imaging DG Abd Portable 1V  Result Date: 05/27/2019 CLINICAL DATA:  OG tube placement EXAM: PORTABLE ABDOMEN - 1 VIEW COMPARISON:  10/04/2014 FINDINGS: Esophageal tube tip and side-port project over the proximal stomach. Mild gaseous dilatation of colon without convincing evidence for obstruction. Moderate stool in the colon. IMPRESSION: Esophageal tube tip and side port overlie the proximal stomach Electronically Signed   By: Donavan Foil M.D.   On: 05/27/2019 21:30   CULTURES: Blood-NGTD Wound-Pseudomonas  Susceptibility to follow  ANTIBIOTICS: Cefepime  ASSESSMENT / PLAN:  NEUROLOGIC A:   PEA arrest: likely related to hypoxemia and hypotension in the setting of pulmonary sepsis.  He was intubated, sedated and started on TTM and has subsequently had improvement in neurological status today following commands.  He remains on some fentanyl for pain management and minimal sedation.    P:   -Continue to wean sedation  PULMONARY A: Pneumonia/Empyema: continues on cefepime, receiving dressing changes with packing daily.  He is currently intubated on PRVC with minimal ventilator assistance. He was weaned down off levo overnight and  is currently off all vasopressors  P:   -SBT -continue cefepime d/c vanc -follow cultures -continue wound care with below plan: Per [facility] wound recommendations: Site: Left pleural window surgical wound: Staples to remain in place until seen in clinic (2-3 weeks after discharge). Pack wound every Monday, Wednesday, Friday with 2 kerlex tied together (to ensure they are able to be removed), Soaked in [place] solution (moderately soaked), packed into site and covered ABD. Periwound skin care per WOC:L flank periwound care 1. Remove dressing with Adhesive Releaser spray (SAP # [id number]) 2. "Crust" skin by  lightly dusting with Nystatin Powder (Pharmacy) and spraying with No Sting barrier film (SAP # [id number]). 3. Top with hydrophilic foam (4x4 - SAP # [id number], 4x8 - SAP # [id number]) 4. Secure with Medipore tape (SAP # [id number]). On discharge, will need to arrange follow-up with [facility]   CARDIOVASCULAR A: Shock/Afib: There is a question of cardiogenic shock and mixed shock picture, he is overtly volume overloaded on exam.  Additionally he has been in and out of  Atrial fibrillation, he has converted back to NSR this am.    P:  -assess cardiac function with ECHO -trend troponin -continue amiodarone, started on heparin drip due to liver injury cardiology following -follow lactic acid -diurese when able, possibly this afternoon vs tomorrow if bp holds or continues to improve  HEMATOLOGIC A: Anemia: CBC this am without leukocytosis but showing a new normocytic anemia.  This is likely an acute blood loss anemia blood loss from wound drainage.  May be a component of hypoproliferation as well due to sepsis  P:  -transfuse 1u, post transfusion H and H  RENAL/Metabolic A:   HWK:GSUPJS renal function at baseline, now with AKI due to hypoperfusion.  Discussed with the family if renal function continues to decline would they/the patient want dialysis and they said no not at this time Hyperkalemia: in the setting of renal injury due to above  P:   -Off pressors continue to support bp -lokelma and serial bmp -decision for no dialysis if progression, may improve somewhat with volume removal if bp maintains  GASTROINTESTINAL A:  Shock Liver: severe transaminitis consistent with shock liver Nutrition: On tube feeding protocol   P:   -off pressors continue to monitor bp map >65 goal and serial CMP -continue tube feeds   FAMILY  - Updates: spoke with daughter and wife today updated them and reviewed goals of care  - Inter-disciplinary family meet or Palliative Care meeting due by:  day 7  Pulmonary and  [place] Pager: [phone] Vickki Muff MD PGY-3 Internal Medicine Pager # [phone]   05/28/2019, 8:42 AM

## 2019-05-28 NOTE — Progress Notes (Signed)
[facility] Progress Note Patient Name: Zachary Bean DOB: 05-26-1940 MRN: [id number]   Date of Service  05/28/2019  HPI/Events of Note  Anemia - Hgb = 6.2.  eICU Interventions  Will transfuse 1 unit PRBC now.      Intervention Category Major Interventions: Other:  Ramesses Crampton Cornelia Copa 05/28/2019, 2:03 AM

## 2019-05-29 DIAGNOSIS — Z9289 Personal history of other medical treatment: Secondary | ICD-10-CM

## 2019-05-29 LAB — APTT
aPTT: 61 seconds — ABNORMAL HIGH (ref 24–36)
aPTT: 87 seconds — ABNORMAL HIGH (ref 24–36)

## 2019-05-29 LAB — GLUCOSE, CAPILLARY
Glucose-Capillary: 91 mg/dL (ref 70–99)
Glucose-Capillary: 106 mg/dL — ABNORMAL HIGH (ref 70–99)
Glucose-Capillary: 113 mg/dL — ABNORMAL HIGH (ref 70–99)
Glucose-Capillary: 126 mg/dL — ABNORMAL HIGH (ref 70–99)
Glucose-Capillary: 173 mg/dL — ABNORMAL HIGH (ref 70–99)
Glucose-Capillary: 159 mg/dL — ABNORMAL HIGH (ref 70–99)

## 2019-05-29 LAB — HEPATIC FUNCTION PANEL
ALT: 1175 U/L — ABNORMAL HIGH (ref 0–44)
AST: 1286 U/L — ABNORMAL HIGH (ref 15–41)
Albumin: 1.5 g/dL — ABNORMAL LOW (ref 3.5–5.0)
Alkaline Phosphatase: 203 U/L — ABNORMAL HIGH (ref 38–126)
Bilirubin, Direct: 0.4 mg/dL — ABNORMAL HIGH (ref 0.0–0.2)
Indirect Bilirubin: 0.5 mg/dL (ref 0.3–0.9)
Total Bilirubin: 0.9 mg/dL (ref 0.3–1.2)
Total Protein: 5.3 g/dL — ABNORMAL LOW (ref 6.5–8.1)

## 2019-05-29 LAB — CBC
HCT: 30.3 % — ABNORMAL LOW (ref 39.0–52.0)
Hemoglobin: 9.2 g/dL — ABNORMAL LOW (ref 13.0–17.0)
MCH: 28.5 pg (ref 26.0–34.0)
MCHC: 30.4 g/dL (ref 30.0–36.0)
MCV: 93.8 fL (ref 80.0–100.0)
Platelets: 155 10*3/uL (ref 150–400)
RBC: 3.23 MIL/uL — ABNORMAL LOW (ref 4.22–5.81)
RDW: 15.2 % (ref 11.5–15.5)
WBC: 7.3 10*3/uL (ref 4.0–10.5)
nRBC: 0.3 % — ABNORMAL HIGH (ref 0.0–0.2)

## 2019-05-29 LAB — BASIC METABOLIC PANEL
Anion gap: 10 (ref 5–15)
BUN: 47 mg/dL — ABNORMAL HIGH (ref 8–23)
CO2: 19 mmol/L — ABNORMAL LOW (ref 22–32)
Calcium: 7.3 mg/dL — ABNORMAL LOW (ref 8.9–10.3)
Chloride: 100 mmol/L (ref 98–111)
Creatinine, Ser: 3.38 mg/dL — ABNORMAL HIGH (ref 0.61–1.24)
GFR calc Af Amer: 19 mL/min — ABNORMAL LOW (ref >60)
GFR calc non Af Amer: 17 mL/min — ABNORMAL LOW (ref >60)
Glucose, Bld: 139 mg/dL — ABNORMAL HIGH (ref 70–99)
Potassium: 4.5 mmol/L (ref 3.5–5.1)
Sodium: 129 mmol/L — ABNORMAL LOW (ref 135–145)

## 2019-05-29 LAB — HEPARIN LEVEL (UNFRACTIONATED): Heparin Unfractionated: >2.2 IU/mL — ABNORMAL HIGH (ref 0.30–0.70)

## 2019-05-29 LAB — PHOSPHORUS
Phosphorus: 5 mg/dL — ABNORMAL HIGH (ref 2.5–4.6)
Phosphorus: 4.6 mg/dL (ref 2.5–4.6)

## 2019-05-29 LAB — MAGNESIUM
Magnesium: 2 mg/dL (ref 1.7–2.4)
Magnesium: 1.9 mg/dL (ref 1.7–2.4)

## 2019-05-29 MED ORDER — FUROSEMIDE 10 MG/ML IJ SOLN: 40.0000 mg | Freq: Two times a day (BID) | INTRAMUSCULAR | Status: DC

## 2019-05-29 MED ORDER — NALOXONE HCL 0.4 MG/ML IJ SOLN
INTRAMUSCULAR | Status: AC
  Filled 2019-05-29: qty 1

## 2019-05-29 MED ORDER — FUROSEMIDE 10 MG/ML IJ SOLN
40.0000 mg | Freq: Three times a day (TID) | INTRAMUSCULAR | Status: DC
  Administered 2019-05-29 – 2019-05-30 (×3): 40 mg via INTRAVENOUS
  Filled 2019-05-29 (×3): qty 4

## 2019-05-29 MED ORDER — VITAL AF 1.2 CAL PO LIQD: 1000.0000 mL | ORAL | Status: DC

## 2019-05-29 MED ORDER — NALOXONE HCL 0.4 MG/ML IJ SOLN
INTRAMUSCULAR | Status: AC
  Administered 2019-05-29: 0.4 mg
  Filled 2019-05-29: qty 1

## 2019-05-29 MED ORDER — NOREPINEPHRINE 4 MG/250ML-% IV SOLN
0.0000 ug/min | INTRAVENOUS | Status: DC
  Administered 2019-05-29: 6 ug/min via INTRAVENOUS
  Administered 2019-05-30: 5 ug/min via INTRAVENOUS
  Filled 2019-05-29 (×2): qty 250

## 2019-05-29 MED ORDER — PRO-STAT SUGAR FREE PO LIQD: 30.0000 mL | Freq: Three times a day (TID) | ORAL | Status: DC

## 2019-05-29 MED ORDER — STERILE WATER FOR INJECTION IV SOLN: INTRAVENOUS | Status: DC

## 2019-05-29 MED ORDER — ALBUMIN HUMAN 25 % IV SOLN
25.0000 g | Freq: Four times a day (QID) | INTRAVENOUS | Status: AC
  Administered 2019-05-29 – 2019-05-30 (×4): 25 g via INTRAVENOUS
  Filled 2019-05-29: qty 100
  Filled 2019-05-29 (×5): qty 50

## 2019-05-29 MED ORDER — INSULIN GLARGINE 100 UNIT/ML ~~LOC~~ SOLN
9.0000 [IU] | Freq: Every day | SUBCUTANEOUS | Status: DC
  Administered 2019-05-29: 9 [IU] via SUBCUTANEOUS
  Filled 2019-05-29 (×2): qty 0.09

## 2019-05-29 NOTE — Progress Notes (Addendum)
Nutrition Follow up  DOCUMENTATION CODES:   Non-severe (moderate) malnutrition in context of chronic illness  INTERVENTION:   Tube feeding:  -Vital AF 1.2 @ 50 ml/hr via OGT (1200 ml)  -30 ml Prostat TID  Provides: 1740 kcals, 135 grams protein, 973 ml free water.   NUTRITION DIAGNOSIS:   Moderate Malnutrition related to chronic illness(bronchopleural fistula/pericardial effusion) as evidenced by moderate fat depletion, severe muscle depletion, edema.  Ongoing   GOAL:   Patient will meet greater than or equal to 90% of their needs   Addressed via TF  MONITOR:   Vent status, Skin, Labs, Weight trends, I & O's  REASON FOR ASSESSMENT:   Ventilator    ASSESSMENT:   79 yo male admitted with SOB and palpitations. He developed hypotension and bradycardia with brief code blue and required intubation and transfer to the ICU early on 1/2. PMH includes recent hospitalization at [facility] for bronchopleural fistula S/P thoracoscopy & bronchoscopy, lung cancer (1995), pericardial effusion (2008), DM, hypothyroidism, PAF.   Pt discussed during ICU rounds and with RN.   Rewarmed. Off sedation. Unable to wean today. Hyperkalemia improved with loklema. Cr continues to rise- family does not wish for pt to have HD if needed. Tolerating Vital High Protein at 40 ml/hr. Change formula to better meet needs.    NFPE performed. Moderate fat depletions and severe muscle depletion found. Suspect severe malnutrition but unable to diagnosis with severe pitting edema in all extremities (likely masking losses).   Admission weight: 79.8 kg  Current weight: 81.5 kg   Patient remains intubated on ventilator support MV: 9.1 L/min Temp (24hrs), Avg:97.8 F (36.6 C), Min:97.1 F (36.2 C), Max:98.3 F (36.8 C)   I/O: +4,094 ml since admit UOP: 250 ml x 24 hrs   Drips: albumin, amiodarone  Medications: 40 mg lasix BID, SS novolog, lantus, lokelma  Labs: Na 129 (L) Cr 3.38-trending up Phosphorus 5.0  (H) LFTs elevated CBG 74-126   Diet Order:   Diet Order            Diet NPO time specified  Diet effective now              EDUCATION NEEDS:   Not appropriate for education at this time  Skin:  Skin Assessment: Skin Integrity Issues: Skin Integrity Issues:: Other (Comment), Incisions Incisions: L back Other: non pressure wound L buttocks, healing wound R buttocks, MASD to buttocks  Last BM:  1/1  Height:   Ht Readings from Last 1 Encounters:  06/15/2019 5\' 10"  (1.778 m)    Weight:   Wt Readings from Last 1 Encounters:  05/29/19 81.5 kg    Ideal Body Weight:  75.5 kg  BMI:  Body mass index is 25.78 kg/m.  Estimated Nutritional Needs:   Kcal:  1655 kcal  Protein:  120-135 grams  Fluid:  >/- 1.6 L/day   Mariana Single RD, LDN Clinical Nutrition Pager # - [phone]

## 2019-05-29 NOTE — Progress Notes (Signed)
PULMONARY / CRITICAL CARE MEDICINE    Name: Zachary Bean MRN: [id number] DOB: July 08, 1940    ADMISSION DATE:  06/02/2019 CONSULTATION DATE:  05/27/19  CHIEF COMPLAINT:  Pulmonary sepsis  HISTORY OF PRESENT ILLNESS:    79 yo male PMH lung cancer s/p left pneumonectomy 25 years prior, complicated by pericardial effusion and left bronchopleural fistula, he has had pericardial and pleural windows placed.  Recently hospitalized at [facility] where he was found to have AFRVR was cardioverted also noted to have LAA thrombus and MSSA infection around pleural window.  He presented here with septic shock from pneumonia and cardiac arrest PCCM was consulted for post cardiac arrest care and pulmonary sepsis management.    PAST MEDICAL HISTORY :  He  has a past medical history of Acute idiopathic pericarditis (2008), Childhood asthma, Chronic kidney disease, Chronic lower back pain, Chronic sinusitis, Duodenal ulcer, chronic, GERD (gastroesophageal reflux disease), Headache, History of kidney stones (1960'S), Hyperlipidemia, Hypothyroidism, Left trigger finger, Lung cancer ([place]) (1995), Normal echocardiogram (08/2011), Normal nuclear stress test (08/2011), NSVT (nonsustained ventricular tachycardia) ([place]), PAF (paroxysmal atrial fibrillation) ([place]) (10 YRS AGO NOT CURRENT PROBLEM), and Type II diabetes mellitus ([place]).  PAST SURGICAL HISTORY: He  has a past surgical history that includes Pneumonectomy (Left, 1995); Lumbar microdiscectomy (1984); Back surgery; Cataract extraction w/ intraocular lens  implant, bilateral (Bilateral); Cardiac catheterization (N/A, 07/09/2015); Atrial fibrillation ablation (early 2000s); LEFT LUNG REMOVED ; Inguinal hernia repair (Left, 2000); SKIN CANCER AREAS REMOVED FROM EAR AND CHEST; Inguinal hernia repair (Right, 11/11/2017); and Insertion of mesh (Right, 11/11/2017).  Allergies  Allergen Reactions  . Iodinated Diagnostic Agents Hives, Itching and Other (See Comments)    Other Reaction:  stinging  . Doxycycline Nausea And Vomiting  . Empagliflozin     DIZZINESS, HEACAHES  Other reaction(s): headache "Jardiance"    No current facility-administered medications on file prior to encounter.   Current Outpatient Medications on File Prior to Encounter  Medication Sig  . acetaminophen (TYLENOL) 500 MG tablet Take 500 mg by mouth every 6 (six) hours as needed for moderate pain or headache.  Marland Kitchen amiodarone (PACERONE) 200 MG tablet Take 200 mg by mouth daily.  . budesonide-formoterol (SYMBICORT) 160-4.5 MCG/ACT inhaler Inhale 2 puffs into the lungs 2 (two) times daily as needed (for shortness of breath).  . clotrimazole (LOTRIMIN) 1 % cream Apply 1 application topically 2 (two) times daily.  Marland Kitchen diltiazem (CARDIZEM CD) 360 MG 24 hr capsule Take 360 mg by mouth daily.  Marland Kitchen ELIQUIS 5 MG TABS tablet Take 5 mg by mouth 2 (two) times daily.  . fluconazole (DIFLUCAN) 100 MG tablet Take 100 mg by mouth daily.  Marland Kitchen gabapentin (NEURONTIN) 100 MG capsule Take 100 mg by mouth 3 (three) times daily.  . Insulin Aspart FlexPen 100 UNIT/ML SOPN Inject 4 Units into the skin 3 (three) times daily.  Marland Kitchen LANTUS SOLOSTAR 100 UNIT/ML Solostar Pen Inject 12 Units into the skin daily.  Marland Kitchen linezolid (ZYVOX) 600 MG tablet Take 600 mg by mouth 2 (two) times daily. For 7 days  . metoprolol succinate (TOPROL-XL) 25 MG 24 hr tablet Take 75 mg by mouth 2 (two) times daily.   . nitroGLYCERIN (NITROSTAT) 0.4 MG SL tablet Place 1 tablet (0.4 mg total) under the tongue every 5 (five) minutes as needed for chest pain. (Patient taking differently: Place 0.4 mg under the tongue See admin instructions. As emergency direction)  . oxyCODONE (OXY IR/ROXICODONE) 5 MG immediate release tablet Take 5 mg by mouth every  4 (four) hours as needed for pain.  Marland Kitchen senna-docusate (SENOKOT-S) 8.6-50 MG tablet Take 1 tablet by mouth daily.  . ONE TOUCH ULTRA TEST test strip   . traMADol (ULTRAM) 50 MG tablet Take 1 tablet (50 mg total) by mouth  every 6 (six) hours as needed for moderate pain or severe pain. (Patient not taking: Reported on 05/28/2019)    FAMILY HISTORY:  His He indicated that his mother is deceased. He indicated that his father is deceased. He indicated that his sister is deceased. He indicated that his brother is deceased. He indicated that his daughter is alive. He indicated that his son is alive.   SOCIAL HISTORY: He  reports that he quit smoking about 25 years ago. His smoking use included cigarettes. He has a 40.00 pack-year smoking history. He has quit using smokeless tobacco.  His smokeless tobacco use included chew. He reports previous alcohol use. He reports that he does not use drugs.  REVIEW OF SYSTEMS:   PT intubated  SUBJECTIVE:  No acute events overnight, transfusion halted due to erroneous prior lab result  VITAL SIGNS: BP (!) 120/49   Pulse 68   Temp 98.3 F (36.8 C) (Axillary)   Resp 17   Ht 5\' 10"  (1.778 m)   Wt 81.5 kg   SpO2 100%   BMI 25.78 kg/m   HEMODYNAMICS: CVP:  [8 mmHg-13 mmHg] 8 mmHg  VENTILATOR SETTINGS: Vent Mode: PRVC FiO2 (%):  [40 %] 40 % Set Rate:  [16 bmp] 16 bmp Vt Set:  [500 mL] 500 mL PEEP:  [5 cmH20] 5 cmH20 Plateau Pressure:  [20 cmH20-29 cmH20] 21 cmH20  INTAKE / OUTPUT: I/O last 3 completed shifts: In: 2714.9 [I.V.:970.9; NG/GT:95; IV Piggyback:1649] Out: 3 [Urine:855]  PHYSICAL EXAMINATION: General:  Cachetic chronically ill appearing male Neuro:  Intubated, sedated, later this morning was following commands giving thumbs up sign to answer questions HEENT:  ET tube in place Cardiovascular:  Regular rhythm, normal rate, no MRG, 2-3+ LE edema bilaterally also some upper extremity edema Lungs:  Right lung clear, left lung decreased BS at base,  Abdomen:  Soft, non distended Skin:bandage from pleural window wound with some serosanguenous drainage   LABS:  BMET Recent Labs  Lab 05/28/19 0103 05/28/19 1913 05/29/19 0330  NA 130* 130* 129*  K  5.4* 4.6 4.5  CL 101 99 100  CO2 18* 18* 19*  BUN 32* 41* 47*  CREATININE 2.31* 3.14* 3.38*  GLUCOSE 102* 100* 139*    Electrolytes Recent Labs  Lab 05/27/19 0147 05/28/19 0103 05/28/19 1913 05/29/19 0330  CALCIUM 7.7* 7.3* 7.5* 7.3*  MG 2.2 1.9 2.1 2.0  PHOS 4.8*  --  5.2* 5.0*    CBC Recent Labs  Lab 05/28/19 0103 05/28/19 1913 05/29/19 0330  WBC 5.2 6.8 7.3  HGB 6.2* 9.2* 9.2*  HCT 20.1* 30.1* 30.3*  PLT 131* 157 155    Coag's Recent Labs  Lab 05/28/19 1913 05/29/19 0330  APTT 44* 61*    Sepsis Markers Recent Labs  Lab 05/27/19 1754 05/27/19 2054 05/28/19 1321  LATICACIDVEN 6.2* 4.8* 3.7*    ABG Recent Labs  Lab 05/27/19 0105 05/27/19 0526  PHART 7.362 7.240*  PCO2ART 30.7* 47.9  PO2ART 138* 419.0*    Liver Enzymes Recent Labs  Lab 06/06/2019 1106 05/27/19 1756 05/28/19 1913  AST 21 3,578* 1,681*  ALT 18 1,576* 1,295*  ALKPHOS 122 189* 209*  BILITOT 0.6 1.0 0.9  ALBUMIN 1.9* 1.7* 1.5*  Cardiac Enzymes No results for input(s): TROPONINI, PROBNP in the last 168 hours.  Glucose Recent Labs  Lab 05/28/19 1641 05/28/19 1912 05/28/19 2020 05/28/19 2331 05/29/19 0102 05/29/19 0336  GLUCAP 122* 97 79 74 91 106*    Imaging DG CHEST PORT 1 VIEW  Result Date: 05/28/2019 CLINICAL DATA:  Check central line placement EXAM: PORTABLE CHEST 1 VIEW COMPARISON:  05/27/2019 FINDINGS: Endotracheal tube and gastric catheter are noted. Right jugular central line is noted in the distal superior vena cava. Postsurgical changes are noted on the left. Packing material is seen within the left hemithorax. Right lung is well aerated with increasing right lower lobe atelectasis. No pneumothorax is seen. No bony abnormality is noted. IMPRESSION: Tubes and lines as described in satisfactory position. Increasing right basilar infiltrate when compared with the prior exam. Status post left pneumonectomy. Electronically Signed   By: Inez Catalina M.D.   On:  05/28/2019 18:50   DG Abd Portable 1V  Result Date: 05/28/2019 CLINICAL DATA:  OG tube placement EXAM: PORTABLE ABDOMEN - 1 VIEW COMPARISON:  May 27, 2019 FINDINGS: The OG tube tip projects over the gastric body. The tip is pointed distally. Oral contrast is noted within the stomach. The visualized bowel gas pattern is nonspecific. IMPRESSION: OG tube projects over the stomach. Electronically Signed   By: Constance Holster M.D.   On: 05/28/2019 21:18   ECHOCARDIOGRAM COMPLETE  Result Date: 05/28/2019   ECHOCARDIOGRAM REPORT   Patient Name:   Zachary Bean Date of Exam: 05/28/2019 Medical Rec #:  [id number]     Height:       70.0 in Accession #:    [id number]    Weight:       176.0 lb Date of Birth:  19-Jul-1940     BSA:          1.98 m Patient Age:    78 years      BP:           109/58 mmHg Patient Gender: M             HR:           82 bpm. Exam Location:  Inpatient Procedure: 2D Echo Indications:    cardiac arrest  History:        Patient has prior history of Echocardiogram examinations, most                 recent 07/10/2015. Arrythmias:Atrial Fibrillation;                 Signs/Symptoms:Shortness of Breath.  Sonographer:    Johny Chess Referring Phys: [id number] CHI JANE ELLISON  Sonographer Comments: Echo performed with patient supine and on artificial respirator. IMPRESSIONS  1. Left ventricular ejection fraction, by visual estimation, is 60 to 65%. The left ventricle has normal function. There is no left ventricular hypertrophy.  2. Right ventricular volume and pressure overload.  3. The left ventricle has no regional wall motion abnormalities.  4. Global right ventricle has normal systolic function.The right ventricular size is normal. No increase in right ventricular wall thickness.  5. Left atrial size was normal.  6. Right atrial size was normal.  7. The mitral valve is normal in structure. Mild mitral valve regurgitation. No evidence of mitral stenosis.  8. The tricuspid valve is normal in  structure.  9. The aortic valve is normal in structure. Aortic valve regurgitation is not visualized. No evidence of aortic valve sclerosis or stenosis. 10. The pulmonic valve  was normal in structure. Pulmonic valve regurgitation is not visualized. 11. Moderately elevated pulmonary artery systolic pressure. 12. The tricuspid regurgitant velocity is 2.82 m/s, and with an assumed right atrial pressure of 15 mmHg, the estimated right ventricular systolic pressure is moderately elevated at 46.8 mmHg. 13. The inferior vena cava is normal in size with greater than 50% respiratory variability, suggesting right atrial pressure of 3 mmHg. FINDINGS  Left Ventricle: Left ventricular ejection fraction, by visual estimation, is 60 to 65%. The left ventricle has normal function. The left ventricle has no regional wall motion abnormalities. There is no left ventricular hypertrophy. The interventricular septum is flattened in systole and diastole, consistent with right ventricular pressure and volume overload. Left ventricular diastolic parameters were normal. Normal left atrial pressure. Right Ventricle: The right ventricular size is normal. No increase in right ventricular wall thickness. Global RV systolic function is has normal systolic function. The tricuspid regurgitant velocity is 2.82 m/s, and with an assumed right atrial pressure  of 15 mmHg, the estimated right ventricular systolic pressure is moderately elevated at 46.8 mmHg. Left Atrium: Left atrial size was normal in size. Right Atrium: Right atrial size was normal in size Pericardium: There is no evidence of pericardial effusion. Mitral Valve: The mitral valve is normal in structure. Mild mitral valve regurgitation. No evidence of mitral valve stenosis by observation. Tricuspid Valve: The tricuspid valve is normal in structure. Tricuspid valve regurgitation is mild. Aortic Valve: The aortic valve is normal in structure. Aortic valve regurgitation is not visualized.  The aortic valve is structurally normal, with no evidence of sclerosis or stenosis. Pulmonic Valve: The pulmonic valve was normal in structure. Pulmonic valve regurgitation is not visualized. Pulmonic regurgitation is not visualized. Aorta: The aortic root, ascending aorta and aortic arch are all structurally normal, with no evidence of dilitation or obstruction. Venous: The inferior vena cava is normal in size with greater than 50% respiratory variability, suggesting right atrial pressure of 3 mmHg. IAS/Shunts: No atrial level shunt detected by color flow Doppler. There is no evidence of a patent foramen ovale. No ventricular septal defect is seen or detected. There is no evidence of an atrial septal defect.  LEFT VENTRICLE PLAX 2D LVIDd:         3.90 cm  Diastology LVIDs:         3.10 cm  LV e' lateral:   7.94 cm/s LV PW:         0.90 cm  LV E/e' lateral: 13.0 LV IVS:        0.80 cm  LV e' medial:    8.38 cm/s LVOT diam:     2.10 cm  LV E/e' medial:  12.3 LV SV:         28 ml LV SV Index:   14.04 LVOT Area:     3.46 cm  RIGHT VENTRICLE RV S prime:     14.50 cm/s TAPSE (M-mode): 1.6 cm LEFT ATRIUM           Index       RIGHT ATRIUM           Index LA diam:      3.60 cm 1.82 cm/m  RA Area:     17.10 cm LA Vol (A4C): 47.3 ml 23.93 ml/m RA Volume:   45.70 ml  23.12 ml/m  AORTIC VALVE LVOT Vmax:   80.30 cm/s LVOT Vmean:  50.100 cm/s LVOT VTI:    0.146 m  AORTA Ao Root diam: 3.00 cm  Ao Asc diam:  2.90 cm MITRAL VALVE                         TRICUSPID VALVE MV Area (PHT): 3.72 cm              TR Peak grad:   31.8 mmHg MV PHT:        59.16 msec            TR Vmax:        282.00 cm/s MV Decel Time: 204 msec MV E velocity: 103.00 cm/s 103 cm/s  SHUNTS MV A velocity: 41.60 cm/s  70.3 cm/s Systemic VTI:  0.15 m MV E/A ratio:  2.48        1.5       Systemic Diam: 2.10 cm  Ena Dawley MD Electronically signed by Ena Dawley MD Signature Date/Time: 05/28/2019/4:49:20 PM    Final     CULTURES: Blood-NGTD Wound-Pseudomonas  Susceptible to cefepime  ANTIBIOTICS: Cefepime 1/1-->continued Vancomycin 1/1-1/3  ASSESSMENT / PLAN:  NEUROLOGIC A:   PEA arrest: likely related to hypoxemia and hypotension in the setting of pulmonary sepsis.  He was intubated, sedated and started on TTM he has had improvement in neurological status following commands 1/3.  He remains on fentanyl for pain management and minimal sedation.    P:   -Continue to wean sedation as able  PULMONARY A: Pneumonia/Empyema: continues on cefepime, receiving dressing changes with packing daily.  He is currently intubated on PRVC with minimal ventilator assistance. His pressor support restarted yesterday currently on 9 of levo  P:   -SBT -continue cefepime  -follow cultures -continue wound care with below plan: Per [facility] wound recommendations: Site: Left pleural window surgical wound: Staples to remain in place until seen in clinic (2-3 weeks after discharge). Pack wound every Monday, Wednesday, Friday with 2 kerlex tied together (to ensure they are able to be removed), Soaked in Speed solution (moderately soaked), packed into site and covered ABD. Periwound skin care per WOC:L flank periwound care 1. Remove dressing with Adhesive Releaser spray (SAP # [id number]) 2. "Crust" skin by lightly dusting with Nystatin Powder (Pharmacy) and spraying with No Sting barrier film (SAP # [id number]). 3. Top with hydrophilic foam (4x4 - SAP # [id number], 4x8 - SAP # [id number]) 4. Secure with Medipore tape (SAP # [id number]). On discharge, will need to arrange follow-up with [facility]   CARDIOVASCULAR A: Shock/Afib: Initially question of mixed shock given that he is overtly volume overloaded on exam however ECHO points away from this.  Additionally he has been in and out of Atrial fibrillation, he has converted back to NSR.  ECHO with EF 60-65%, RV volume overload with moderate PAH systolic pressure, IVC collapsible, no RWMA  P:   -continue amiodarone, switched from eliquis to heparin drip due to liver injury cardiology following -diurese when able, when bp improves  HEMATOLOGIC A: Anemia: CBC this am shows a stable normocytic anemia.  Transfusion halted yesterday evening due to erroneous prior lab result.  Anemia likely combination of chronic blood loss from wound drainage.  May be as well as a a component of anemia of chronic disease and bone marrow suppression from infection. Heparin started 1/3 for afib  P:  -Continue to monitor  RENAL/Metabolic/Endocrine A:   ACZ:YSAYTK renal function at baseline, now with AKI due to hypoperfusion.  Discussed with the family if renal function continues to decline would they/the patient want dialysis and they have decided  no not at this time Hyperkalemia: in the setting of renal injury due to above, improving on lokelma  T2DM  P:   -Pressor support PRN to maintain MAP >65 -lokelma at maintenance dose K improved -decision for no dialysis by family if progression, may improve somewhat with volume removal if bp maintains off pressor support -decrease lantus to 9, SSI  GASTROINTESTINAL A:  Shock Liver: severe transaminitis consistent with shock liver Nutrition: On tube feeding protocol   P:   -pressor support PRN continue to monitor bp map >65 goal and serial CMP seems to be heading in the right direction slowly -continue tube feeds   FAMILY  - Updates: called and updated family  - Inter-disciplinary family meet or Palliative Care meeting due by:  day 7  Pulmonary and [place] Pager: [phone] Vickki Muff MD PGY-3 Internal Medicine Pager # [phone]   05/29/2019, 6:25 AM

## 2019-05-29 NOTE — Procedures (Signed)
Extubation Procedure Note  Patient Details:   Name: Zachary Bean DOB: [birth year] MRN: [id number]   Airway Documentation:    Vent end date: 05/29/19 Vent end time: 1542   Evaluation  O2 sats: stable throughout Complications: No apparent complications Patient did tolerate procedure well. Bilateral Breath Sounds: Clear, Diminished   Patient extubated per MD order. Positive cuff leak. No stridor noted. Patient was put on 3L [place]. Vitals are stable. Patient is trying to cough, but too weak. RN made aware.  Herbie Saxon Emree Locicero 05/29/2019, 3:46 PM

## 2019-05-29 NOTE — Progress Notes (Signed)
Nutrition Follow up  DOCUMENTATION CODES:   Not applicable  INTERVENTION:   Tube feeding:  -Vital AF 1.2 @ 50 ml/hr via OGT (1200 ml)  -30 ml Prostat TID  Provides: 1740 kcals, 135 grams protein, 973 ml free water.   NUTRITION DIAGNOSIS:   Inadequate oral intake related to inability to eat as evidenced by NPO status.  Ongoing   GOAL:   Patient will meet greater than or equal to 90% of their needs   Addressed via TF  MONITOR:   Vent status, Skin, Labs, Weight trends, I & O's  REASON FOR ASSESSMENT:   Ventilator    ASSESSMENT:   79 yo male admitted with SOB and palpitations. He developed hypotension and bradycardia with brief code blue and required intubation and transfer to the ICU early on 1/2. PMH includes recent hospitalization at [facility] for bronchopleural fistula S/P thoracoscopy & bronchoscopy, lung cancer (1995), pericardial effusion (2008), DM, hypothyroidism, PAF.   Pt discussed during ICU rounds and with RN.   Warmed. Off sedation. Unable to wean today. Hyperkalemia improved with loklema. Cr continues to rise- family does not wish for pt to have HD if needed. Tolerating Vital High Protein at 40 ml/hr. Change formula to better meet needs.   Admission weight: 79.8 kg  Current weight: 81.5 kg   Patient remains intubated on ventilator support MV: 9.1 L/min Temp (24hrs), Avg:97.8 F (36.6 C), Min:97.1 F (36.2 C), Max:98.3 F (36.8 C)   I/O: +4,094 ml since admit UOP: 250 ml x 24 hrs   Drips: albumin, amiodarone  Medications: 40 mg lasix BID, SS novolog, lantus, lokelma  Labs: Na 129 (L) Cr 3.38-trending up Phosphorus 5.0 (H) LFTs elevated CBG 74-126   Diet Order:   Diet Order            Diet NPO time specified  Diet effective now              EDUCATION NEEDS:   Not appropriate for education at this time  Skin:  Skin Assessment: Skin Integrity Issues: Skin Integrity Issues:: Other (Comment), Incisions Incisions: L back Other: non pressure  wound L buttocks, healing wound R buttocks, MASD to buttocks  Last BM:  1/1  Height:   Ht Readings from Last 1 Encounters:  06/13/2019 5\' 10"  (1.778 m)    Weight:   Wt Readings from Last 1 Encounters:  05/29/19 81.5 kg    Ideal Body Weight:  75.5 kg  BMI:  Body mass index is 25.78 kg/m.  Estimated Nutritional Needs:   Kcal:  1655 kcal  Protein:  120-135 grams  Fluid:  >/- 1.6 L/day   Mariana Single RD, LDN Clinical Nutrition Pager # - [phone]

## 2019-05-29 NOTE — Progress Notes (Signed)
[place] for Heparin Indication: atrial fibrillation  Allergies  Allergen Reactions  . Iodinated Diagnostic Agents Hives, Itching and Other (See Comments)    Other Reaction: stinging  . Doxycycline Nausea And Vomiting  . Empagliflozin     DIZZINESS, HEACAHES  Other reaction(s): headache "Jardiance"    Patient Measurements: Height: '5\' 10"'  (177.8 cm) Weight: 179 lb 10.8 oz (81.5 kg) IBW/kg (Calculated) : 73  Vital Signs: Temp: 98.1 F (36.7 C) (01/04 0700) Temp Source: Oral (01/04 0700) BP: 123/49 (01/04 1000) Pulse Rate: 70 (01/04 1000)  Labs: Recent Labs    05/27/19 0147 05/27/19 0147 05/27/19 1756 05/28/19 0103 05/28/19 1913 05/29/19 0330  HGB 9.3*   < >  --  6.2* 9.2* 9.2*  HCT 31.1*   < >  --  20.1* 30.1* 30.3*  PLT 348  --   --  131* 157 155  APTT  --   --   --   --  44* 61*  HEPARINUNFRC  --   --   --   --   --  >2.20*  CREATININE 1.52*   < > 2.27* 2.31* 3.14* 3.38*  TROPONINIHS 17  --  47* 41*  --   --    < > = values in this interval not displayed.    Estimated Creatinine Clearance: 19.2 mL/min (A) (by C-G formula based on SCr of 3.38 mg/dL (H)).   Medical History: Past Medical History:  Diagnosis Date  . Acute idiopathic pericarditis 2008   s/p fluoroscopic pericardial window at [facility]  . Childhood asthma   . Chronic kidney disease    STAGE 2 CKD FOLLOWED BY PRIMARY DR Wynelle Link  . Chronic lower back pain   . Chronic sinusitis   . Duodenal ulcer, chronic   . GERD (gastroesophageal reflux disease)   . Headache    "at least once/week; sometimes more" (07/09/2015)  . History of kidney stones 1960'S  . Hyperlipidemia   . Hypothyroidism   . Left trigger finger   . Lung cancer ([place]) 1995   s/p left pneumonectomy in 1995; also treated with radiation  . Normal echocardiogram 08/2011  . Normal nuclear stress test 08/2011   EF was slightly down but normal by echo; no ischemia or scar noted.   Marland Kitchen NSVT  (nonsustained ventricular tachycardia) ([place])   . PAF (paroxysmal atrial fibrillation) ([place]) 10 YRS AGO NOT CURRENT PROBLEM   s/p ablation at [facility]  . Type II diabetes mellitus ([facility])     Medications:  Scheduled:  . sodium chloride   Intravenous Once  . baclofen  10 mg Per Tube TID  . chlorhexidine gluconate (MEDLINE KIT)  15 mL Mouth Rinse BID  . Chlorhexidine Gluconate Cloth  6 each Topical Daily  . Chlorhexidine Gluconate Cloth  6 each Topical Q0600  . feeding supplement (PRO-STAT SUGAR FREE 64)  30 mL Per Tube BID  . feeding supplement (VITAL HIGH PROTEIN)  1,000 mL Per Tube Q24H  . insulin aspart  0-9 Units Subcutaneous TID WC  . insulin glargine  9 Units Subcutaneous QHS  . levothyroxine  100 mcg Oral Q0600  . mouth rinse  15 mL Mouth Rinse 10 times per day  . pantoprazole sodium  40 mg Per Tube Daily  . sodium chloride flush  3 mL Intravenous Once  . [START ON 05/30/2019] sodium zirconium cyclosilicate  5 g Oral Daily    Assessment: 28 yoM on Eliquis PTA for hx AFib and LAA thrombus transitioned  to heparin with liver dysfunction and possible need for procedures  Heparin level remains falsely elevated by recent DOAC use, aPTT is subtherapeutic at 61 seconds, CBC stable.  Goal of Therapy:  APTT 66-102 sec Heparin level 0.3-0.7 units/ml Monitor platelets by anticoagulation protocol: Yes   Plan:  -Increase heparin to 1150 units/hr -Recheck aPTT in [place], PharmD, BCPS Clinical Pharmacist [phone] Please check AMION for all [facility] Pharmacy numbers 05/29/2019

## 2019-05-29 NOTE — Progress Notes (Signed)
Progress Note  Patient Name: Zachary Bean Date of Encounter: 05/29/2019  Primary Cardiologist: Dr Audie Box  Subjective   Intubated and sedated  Inpatient Medications    Scheduled Meds: . sodium chloride   Intravenous Once  . baclofen  10 mg Per Tube TID  . chlorhexidine gluconate (MEDLINE KIT)  15 mL Mouth Rinse BID  . Chlorhexidine Gluconate Cloth  6 each Topical Daily  . Chlorhexidine Gluconate Cloth  6 each Topical Q0600  . feeding supplement (PRO-STAT SUGAR FREE 64)  30 mL Per Tube BID  . feeding supplement (VITAL HIGH PROTEIN)  1,000 mL Per Tube Q24H  . insulin aspart  0-9 Units Subcutaneous TID WC  . insulin glargine  9 Units Subcutaneous QHS  . levothyroxine  100 mcg Oral Q0600  . mouth rinse  15 mL Mouth Rinse 10 times per day  . pantoprazole sodium  40 mg Per Tube Daily  . sodium chloride flush  3 mL Intravenous Once  . sodium zirconium cyclosilicate  10 g Oral TID   Followed by  . [START ON 05/30/2019] sodium zirconium cyclosilicate  5 g Oral Daily   Continuous Infusions: . sodium chloride 20 mL/hr at 05/29/19 0600  . amiodarone 30 mg/hr (05/29/19 0700)  . ceFEPime (MAXIPIME) IV    . fentaNYL infusion INTRAVENOUS 150 mcg/hr (05/29/19 0700)  . heparin 1,000 Units/hr (05/29/19 0700)  . norepinephrine (LEVOPHED) Adult infusion 9 mcg/min (05/29/19 0700)  . sodium chloride     PRN Meds: acetaminophen, fentaNYL, glucagon (human recombinant), lubiprostone   Vital Signs    Vitals:   05/29/19 0615 05/29/19 0630 05/29/19 0645 05/29/19 0700  BP: (!) 120/49 (!) 121/49 (!) 125/56 (!) 125/49  Pulse: 68 67 68 67  Resp: _0 Temp:    98.1 F (36.7 C)  TempSrc:    Oral  SpO2: 100% 100% 100% 100%  Weight:      Height:        Intake/Output Summary (Last 24 hours) at 05/29/2019 0819 Last data filed at 05/29/2019 0700 Gross per 24 hour  Intake 1899.99 ml  Output 250 ml  Net 1649.99 ml   Last 3 Weights 05/29/2019 06/14/2019 03/20/2019  Weight (lbs) 179 lb 10.8 oz  176 lb 175 lb  Weight (kg) 81.5 kg 79.833 kg 79.379 kg      Telemetry    Sinus- Personally Reviewed  Physical Exam   GEN: Intubated and sedated Neck: No JVD Cardiac: RRR Respiratory: CTA anteriorly GI: Soft, non-distended  MS: 2+ edema Neuro:  Intubated and sedated   Labs    High Sensitivity Troponin:   Recent Labs  Lab 06/13/2019 1106 06/20/2019 1306 05/27/19 0147 05/27/19 1756 05/28/19 0103  TROPONINIHS _1 47* 41*      Chemistry Recent Labs  Lab 06/20/2019 1106 05/27/19 1756 05/28/19 0103 05/28/19 1913 05/29/19 0330  NA 129* 132* 130* 130* 129*  K 4.7 5.5* 5.4* 4.6 4.5  CL 92* 97* 101 99 100  CO2 27 18* 18* 18* 19*  GLUCOSE 177* 152* 102* 100* 139*  BUN 17 28* 32* 41* 47*  CREATININE 1.31* 2.27* 2.31* 3.14* 3.38*  CALCIUM 8.4* 7.9* 7.3* 7.5* 7.3*  PROT 6.8 5.9*  --  5.3*  --   ALBUMIN 1.9* 1.7*  --  1.5*  --   AST 21 3,578*  --  1,681*  --   ALT 18 1,576*  --  1,295*  --   ALKPHOS 122 189*  --  209*  --  BILITOT 0.6 1.0  --  0.9  --   GFRNONAA 53* 27* 26* 18* 17*  GFRAA >60 31* 31* 21* 19*  ANIONGAP 10 17* _0 Hematology Recent Labs  Lab 05/28/19 0103 05/28/19 1913 05/29/19 0330  WBC 5.2 6.8 7.3  RBC 2.12* 3.28* 3.23*  HGB 6.2* 9.2* 9.2*  HCT 20.1* 30.1* 30.3*  MCV 94.8 91.8 93.8  MCH 29.2 28.0 28.5  MCHC 30.8 30.6 30.4  RDW 15.2 15.1 15.2  PLT 131* 157 155    BNP Recent Labs  Lab 05/29/2019 1106 05/27/19 2054  BNP 175.6* 335.1*    Radiology    DG CHEST PORT 1 VIEW  Result Date: 05/28/2019 CLINICAL DATA:  Check central line placement EXAM: PORTABLE CHEST 1 VIEW COMPARISON:  05/27/2019 FINDINGS: Endotracheal tube and gastric catheter are noted. Right jugular central line is noted in the distal superior vena cava. Postsurgical changes are noted on the left. Packing material is seen within the left hemithorax. Right lung is well aerated with increasing right lower lobe atelectasis. No pneumothorax is seen. No bony  abnormality is noted. IMPRESSION: Tubes and lines as described in satisfactory position. Increasing right basilar infiltrate when compared with the prior exam. Status post left pneumonectomy. Electronically Signed   By: Inez Catalina M.D.   On: 05/28/2019 18:50   DG Abd Portable 1V  Result Date: 05/28/2019 CLINICAL DATA:  OG tube placement EXAM: PORTABLE ABDOMEN - 1 VIEW COMPARISON:  May 27, 2019 FINDINGS: The OG tube tip projects over the gastric body. The tip is pointed distally. Oral contrast is noted within the stomach. The visualized bowel gas pattern is nonspecific. IMPRESSION: OG tube projects over the stomach. Electronically Signed   By: Constance Holster M.D.   On: 05/28/2019 21:18   DG Abd Portable 1V  Result Date: 05/27/2019 CLINICAL DATA:  OG tube placement EXAM: PORTABLE ABDOMEN - 1 VIEW COMPARISON:  10/04/2014 FINDINGS: Esophageal tube tip and side-port project over the proximal stomach. Mild gaseous dilatation of colon without convincing evidence for obstruction. Moderate stool in the colon. IMPRESSION: Esophageal tube tip and side port overlie the proximal stomach Electronically Signed   By: Donavan Foil M.D.   On: 05/27/2019 21:30   ECHOCARDIOGRAM COMPLETE  Result Date: 05/28/2019   ECHOCARDIOGRAM REPORT   Patient Name:   MASSON NALEPA Date of Exam: 05/28/2019 Medical Rec #:  [id number]     Height:       70.0 in Accession #:    [id number]    Weight:       176.0 lb Date of Birth:  Nov 19, 1940     BSA:          1.98 m Patient Age:    79 years      BP:           109/58 mmHg Patient Gender: M             HR:           82 bpm. Exam Location:  Inpatient Procedure: 2D Echo Indications:    cardiac arrest  History:        Patient has prior history of Echocardiogram examinations, most                 recent 07/10/2015. Arrythmias:Atrial Fibrillation;                 Signs/Symptoms:Shortness of Breath.  Sonographer:    Johny Chess Referring Phys: [id number] Leroy Rodman Pickle  Sonographer Comments:  Echo performed with patient supine and on artificial respirator. IMPRESSIONS  1. Left ventricular ejection fraction, by visual estimation, is 60 to 65%. The left ventricle has normal function. There is no left ventricular hypertrophy.  2. Right ventricular volume and pressure overload.  3. The left ventricle has no regional wall motion abnormalities.  4. Global right ventricle has normal systolic function.The right ventricular size is normal. No increase in right ventricular wall thickness.  5. Left atrial size was normal.  6. Right atrial size was normal.  7. The mitral valve is normal in structure. Mild mitral valve regurgitation. No evidence of mitral stenosis.  8. The tricuspid valve is normal in structure.  9. The aortic valve is normal in structure. Aortic valve regurgitation is not visualized. No evidence of aortic valve sclerosis or stenosis. 10. The pulmonic valve was normal in structure. Pulmonic valve regurgitation is not visualized. 11. Moderately elevated pulmonary artery systolic pressure. 12. The tricuspid regurgitant velocity is 2.82 m/s, and with an assumed right atrial pressure of 15 mmHg, the estimated right ventricular systolic pressure is moderately elevated at 46.8 mmHg. 13. The inferior vena cava is normal in size with greater than 50% respiratory variability, suggesting right atrial pressure of 3 mmHg. FINDINGS  Left Ventricle: Left ventricular ejection fraction, by visual estimation, is 60 to 65%. The left ventricle has normal function. The left ventricle has no regional wall motion abnormalities. There is no left ventricular hypertrophy. The interventricular septum is flattened in systole and diastole, consistent with right ventricular pressure and volume overload. Left ventricular diastolic parameters were normal. Normal left atrial pressure. Right Ventricle: The right ventricular size is normal. No increase in right ventricular wall thickness. Global RV systolic function is has normal  systolic function. The tricuspid regurgitant velocity is 2.82 m/s, and with an assumed right atrial pressure  of 15 mmHg, the estimated right ventricular systolic pressure is moderately elevated at 46.8 mmHg. Left Atrium: Left atrial size was normal in size. Right Atrium: Right atrial size was normal in size Pericardium: There is no evidence of pericardial effusion. Mitral Valve: The mitral valve is normal in structure. Mild mitral valve regurgitation. No evidence of mitral valve stenosis by observation. Tricuspid Valve: The tricuspid valve is normal in structure. Tricuspid valve regurgitation is mild. Aortic Valve: The aortic valve is normal in structure. Aortic valve regurgitation is not visualized. The aortic valve is structurally normal, with no evidence of sclerosis or stenosis. Pulmonic Valve: The pulmonic valve was normal in structure. Pulmonic valve regurgitation is not visualized. Pulmonic regurgitation is not visualized. Aorta: The aortic root, ascending aorta and aortic arch are all structurally normal, with no evidence of dilitation or obstruction. Venous: The inferior vena cava is normal in size with greater than 50% respiratory variability, suggesting right atrial pressure of 3 mmHg. IAS/Shunts: No atrial level shunt detected by color flow Doppler. There is no evidence of a patent foramen ovale. No ventricular septal defect is seen or detected. There is no evidence of an atrial septal defect.  LEFT VENTRICLE PLAX 2D LVIDd:         3.90 cm  Diastology LVIDs:         3.10 cm  LV e' lateral:   7.94 cm/s LV PW:         0.90 cm  LV E/e' lateral: 13.0 LV IVS:        0.80 cm  LV e' medial:    8.38 cm/s LVOT diam:  2.10 cm  LV E/e' medial:  12.3 LV SV:         28 ml LV SV Index:   14.04 LVOT Area:     3.46 cm  RIGHT VENTRICLE RV S prime:     14.50 cm/s TAPSE (M-mode): 1.6 cm LEFT ATRIUM           Index       RIGHT ATRIUM           Index LA diam:      3.60 cm 1.82 cm/m  RA Area:     17.10 cm LA Vol (A4C):  47.3 ml 23.93 ml/m RA Volume:   45.70 ml  23.12 ml/m  AORTIC VALVE LVOT Vmax:   80.30 cm/s LVOT Vmean:  50.100 cm/s LVOT VTI:    0.146 m  AORTA Ao Root diam: 3.00 cm Ao Asc diam:  2.90 cm MITRAL VALVE                         TRICUSPID VALVE MV Area (PHT): 3.72 cm              TR Peak grad:   31.8 mmHg MV PHT:        59.16 msec            TR Vmax:        282.00 cm/s MV Decel Time: 204 msec MV E velocity: 103.00 cm/s 103 cm/s  SHUNTS MV A velocity: 41.60 cm/s  70.3 cm/s Systemic VTI:  0.15 m MV E/A ratio:  2.48        1.5       Systemic Diam: 2.10 cm  Ena Dawley MD Electronically signed by Ena Dawley MD Signature Date/Time: 05/28/2019/4:49:20 PM    Final     Patient Profile     Wendal Wilkie is a 79 y.o. 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 Overnight 1/2 he suffered a PEA arrest likely related to hypoxic respiratory failure. Was noted to have bradycardia around the time of arrest. Course complicated by acute kidney injury, acute liver injury, afib with RVR in setting of cardiac arrest.   Echocardiogram shows normal LV function and mild mitral regurgitation.  Assessment & Plan    1 paroxysmal atrial fibrillation-patient remains in sinus rhythm today.  As outlined in previous notes he was noted to have a left atrial appendage thrombus on transesophageal echocardiogram at [facility] in November.  Continue amiodarone to maintain sinus rhythm.  Continue IV heparin for now.  Will transition to apixaban as he improves.  2 status post PEA arrest-this is felt secondary to hypoxemia/respiratory distress.  Echocardiogram shows normal LV function.  Wean pressors as tolerated.  3 acute on  chronic stage III kidney disease-likely ATN from recent cardiac arrest.  Follow renal function closely.  4 CODE STATUS-no further escalation in care including no CPR and no dialysis.  5 sepsis/pneumonia-antibiotics per critical care.  6 elevated liver functions-likely related to recent cardiac arrest/hypotension.  Follow.  For questions or updates, please contact [place] Please consult [email or web address] for contact info under        Signed, Kirk Ruths, MD  05/29/2019, 8:19 AM

## 2019-05-29 NOTE — Progress Notes (Signed)
Pt very drowsy, not following commands after extubation except for nodding (ask pt to cough and will not cough, stares off in space). Narcan 0.4 given per  Dr. Tamala Julian and pt immediately more arousable, following commands such as coughing when asked and saying "hey" to nurse. Pt suctioned by RT at the time. Answers questions by nodding still.

## 2019-05-29 NOTE — Progress Notes (Signed)
Patient was NTS per CCM order. A small amount of thick white / clear secretions were obtained. CCM was made aware.

## 2019-05-29 NOTE — Progress Notes (Signed)
Pharmacy Antibiotic Note  Zachary Bean is a 79 y.o. male admitted on 06/14/2019 with chest pain.  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  Patient is having foul smelling output from recent lobectomy.  Pharmacy has been consulted for vancomycin and cefepime dosing for PNA.    Vancomycin now off, Cr rising, wound cultures growing Pseudomonas.  Plan: Cefepime 2g IV q24h   Height: 5\' 10"  (177.8 cm) Weight: 179 lb 10.8 oz (81.5 kg) IBW/kg (Calculated) : 73  Temp (24hrs), Avg:97.8 F (36.6 C), Min:97.1 F (36.2 C), Max:98.3 F (36.8 C)  Recent Labs  Lab 06/22/2019 1106 05/27/19 0147 05/27/19 0543 05/27/19 0740 05/27/19 1754 05/27/19 1756 05/27/19 2054 05/28/19 0103 05/28/19 1321 05/28/19 1913 05/29/19 0330  WBC 7.4 9.8  --   --   --   --   --  5.2  --  6.8 7.3  CREATININE 1.31* 1.52*  --   --   --  2.27*  --  2.31*  --  3.14* 3.38*  LATICACIDVEN  --   --  8.1* 8.2* 6.2*  --  4.8*  --  3.7*  --   --     Estimated Creatinine Clearance: 19.2 mL/min (A) (by C-G formula based on SCr of 3.38 mg/dL (H)).    Allergies  Allergen Reactions  . Iodinated Diagnostic Agents Hives, Itching and Other (See Comments)    Other Reaction: stinging  . Doxycycline Nausea And Vomiting  . Empagliflozin     DIZZINESS, HEACAHES  Other reaction(s): headache "Jardiance"    Zyvox PTA for several months, to complete course in a few days Augmentin PTA 12/11 >> ?12/24 per [facility] note 11/19  Vanc 1/1 >> 1/3 Cefepime 1/1 >> Flagyl x1 1/1  1/1 left lung fluid cx - Pseudomonas (panS) 1/1 BCx - NGTD   Arrie Senate, PharmD, BCPS Clinical Pharmacist [phone] Please check AMION for all [place] numbers 05/29/2019

## 2019-05-29 NOTE — Progress Notes (Signed)
[place] for Heparin Indication: atrial fibrillation  Allergies  Allergen Reactions  . Iodinated Diagnostic Agents Hives, Itching and Other (See Comments)    Other Reaction: stinging  . Doxycycline Nausea And Vomiting  . Empagliflozin     DIZZINESS, HEACAHES  Other reaction(s): headache "Jardiance"    Patient Measurements: Height: '5\' 10"'  (177.8 cm) Weight: 179 lb 10.8 oz (81.5 kg) IBW/kg (Calculated) : 73  Vital Signs: Temp: 98.6 F (37 C) (01/04 1954) Temp Source: Oral (01/04 1954) BP: 116/58 (01/04 1830) Pulse Rate: 124 (01/04 1830)  Labs: Recent Labs    05/27/19 0147 05/27/19 0147 05/27/19 1756 05/28/19 0103 05/28/19 1913 05/29/19 0330 05/29/19 2020  HGB 9.3*   < >  --  6.2* 9.2* 9.2*  --   HCT 31.1*   < >  --  20.1* 30.1* 30.3*  --   PLT 348  --   --  131* 157 155  --   APTT  --   --   --   --  44* 61* 87*  HEPARINUNFRC  --   --   --   --   --  >2.20*  --   CREATININE 1.52*   < > 2.27* 2.31* 3.14* 3.38*  --   TROPONINIHS 17  --  47* 41*  --   --   --    < > = values in this interval not displayed.    Estimated Creatinine Clearance: 19.2 mL/min (A) (by C-G formula based on SCr of 3.38 mg/dL (H)).   Medical History: Past Medical History:  Diagnosis Date  . Acute idiopathic pericarditis 2008   s/p fluoroscopic pericardial window at [facility]  . Childhood asthma   . Chronic kidney disease    STAGE 2 CKD FOLLOWED BY PRIMARY DR Wynelle Link  . Chronic lower back pain   . Chronic sinusitis   . Duodenal ulcer, chronic   . GERD (gastroesophageal reflux disease)   . Headache    "at least once/week; sometimes more" (07/09/2015)  . History of kidney stones 1960'S  . Hyperlipidemia   . Hypothyroidism   . Left trigger finger   . Lung cancer ([place]) 1995   s/p left pneumonectomy in 1995; also treated with radiation  . Normal echocardiogram 08/2011  . Normal nuclear stress test 08/2011   EF was slightly down but normal by  echo; no ischemia or scar noted.   Marland Kitchen NSVT (nonsustained ventricular tachycardia) ([place])   . PAF (paroxysmal atrial fibrillation) ([place]) 10 YRS AGO NOT CURRENT PROBLEM   s/p ablation at [facility]  . Type II diabetes mellitus (HCC)     Medications:  Scheduled:  . sodium chloride   Intravenous Once  . baclofen  10 mg Per Tube TID  . chlorhexidine gluconate (MEDLINE KIT)  15 mL Mouth Rinse BID  . Chlorhexidine Gluconate Cloth  6 each Topical Daily  . Chlorhexidine Gluconate Cloth  6 each Topical Q0600  . feeding supplement (PRO-STAT SUGAR FREE 64)  30 mL Per Tube TID  . furosemide  40 mg Intravenous Q8H  . insulin aspart  0-9 Units Subcutaneous TID WC  . insulin glargine  9 Units Subcutaneous QHS  . levothyroxine  100 mcg Oral Q0600  . mouth rinse  15 mL Mouth Rinse 10 times per day  . pantoprazole sodium  40 mg Per Tube Daily  . sodium chloride flush  3 mL Intravenous Once  . [START ON 05/30/2019] sodium zirconium cyclosilicate  5 g Oral Daily  Assessment: 89 yoM on Eliquis PTA for hx AFib and LAA thrombus transitioned to heparin with liver dysfunction and possible need for procedures  Heparin level remains falsely elevated by recent DOAC use, aPTT 87sec is within therapeutic goal. CBC stable.  Goal of Therapy:  APTT 66-102 sec Heparin level 0.3-0.7 units/ml Monitor platelets by anticoagulation protocol: Yes   Plan:  -Continue  Heparin 1150 units/hr Daily CBC, HL   Bonnita Nasuti Pharm.D. CPP, BCPS Clinical Pharmacist [phone] 05/29/2019 9:01 PM    Please check AMION for all [place] numbers 05/29/2019

## 2019-05-30 ENCOUNTER — Inpatient Hospital Stay (HOSPITAL_COMMUNITY): Payer: PPO

## 2019-05-30 DIAGNOSIS — R579 Shock, unspecified: Secondary | ICD-10-CM

## 2019-05-30 LAB — CBC
HCT: 25.4 % — ABNORMAL LOW (ref 39.0–52.0)
Hemoglobin: 8.1 g/dL — ABNORMAL LOW (ref 13.0–17.0)
MCH: 28.6 pg (ref 26.0–34.0)
MCHC: 31.9 g/dL (ref 30.0–36.0)
MCV: 89.8 fL (ref 80.0–100.0)
Platelets: 125 10*3/uL — ABNORMAL LOW (ref 150–400)
RBC: 2.83 MIL/uL — ABNORMAL LOW (ref 4.22–5.81)
RDW: 15.2 % (ref 11.5–15.5)
WBC: 4.2 10*3/uL (ref 4.0–10.5)
nRBC: 0.7 % — ABNORMAL HIGH (ref 0.0–0.2)

## 2019-05-30 LAB — APTT: aPTT: 117 seconds — ABNORMAL HIGH (ref 24–36)

## 2019-05-30 LAB — COMPREHENSIVE METABOLIC PANEL
ALT: 796 U/L — ABNORMAL HIGH (ref 0–44)
AST: 496 U/L — ABNORMAL HIGH (ref 15–41)
Albumin: 2.3 g/dL — ABNORMAL LOW (ref 3.5–5.0)
Alkaline Phosphatase: 235 U/L — ABNORMAL HIGH (ref 38–126)
Anion gap: 13 (ref 5–15)
BUN: 62 mg/dL — ABNORMAL HIGH (ref 8–23)
CO2: 20 mmol/L — ABNORMAL LOW (ref 22–32)
Calcium: 7.6 mg/dL — ABNORMAL LOW (ref 8.9–10.3)
Chloride: 99 mmol/L (ref 98–111)
Creatinine, Ser: 4.2 mg/dL — ABNORMAL HIGH (ref 0.61–1.24)
GFR calc Af Amer: 15 mL/min — ABNORMAL LOW (ref >60)
GFR calc non Af Amer: 13 mL/min — ABNORMAL LOW (ref >60)
Glucose, Bld: 126 mg/dL — ABNORMAL HIGH (ref 70–99)
Potassium: 4 mmol/L (ref 3.5–5.1)
Sodium: 132 mmol/L — ABNORMAL LOW (ref 135–145)
Total Bilirubin: 0.9 mg/dL (ref 0.3–1.2)
Total Protein: 6.1 g/dL — ABNORMAL LOW (ref 6.5–8.1)

## 2019-05-30 LAB — PHOSPHORUS: Phosphorus: 5 mg/dL — ABNORMAL HIGH (ref 2.5–4.6)

## 2019-05-30 LAB — GLUCOSE, CAPILLARY
Glucose-Capillary: 118 mg/dL — ABNORMAL HIGH (ref 70–99)
Glucose-Capillary: 122 mg/dL — ABNORMAL HIGH (ref 70–99)
Glucose-Capillary: 152 mg/dL — ABNORMAL HIGH (ref 70–99)

## 2019-05-30 LAB — HEPARIN LEVEL (UNFRACTIONATED): Heparin Unfractionated: >2.2 IU/mL — ABNORMAL HIGH (ref 0.30–0.70)

## 2019-05-30 LAB — MAGNESIUM: Magnesium: 2 mg/dL (ref 1.7–2.4)

## 2019-05-30 MED ORDER — HYDROMORPHONE HCL 1 MG/ML IJ SOLN: 1.0000 mg | INTRAMUSCULAR | Status: DC | PRN

## 2019-05-30 MED ORDER — GLYCOPYRROLATE 0.2 MG/ML IJ SOLN: 0.2000 mg | INTRAMUSCULAR | Status: DC | PRN

## 2019-05-30 MED ORDER — ONDANSETRON 4 MG PO TBDP
4.0000 mg | ORAL_TABLET | Freq: Four times a day (QID) | ORAL | Status: DC | PRN
  Filled 2019-05-30: qty 1

## 2019-05-30 MED ORDER — ACETAMINOPHEN 325 MG PO TABS: 650.0000 mg | ORAL_TABLET | Freq: Four times a day (QID) | ORAL | Status: DC | PRN

## 2019-05-30 MED ORDER — ONDANSETRON HCL 4 MG/2ML IJ SOLN: 4.0000 mg | Freq: Four times a day (QID) | INTRAMUSCULAR | Status: DC | PRN

## 2019-05-30 MED ORDER — LORAZEPAM 1 MG PO TABS: 1.0000 mg | ORAL_TABLET | ORAL | Status: DC | PRN

## 2019-05-30 MED ORDER — HALOPERIDOL 0.5 MG PO TABS
0.5000 mg | ORAL_TABLET | ORAL | Status: DC | PRN
  Filled 2019-05-30: qty 1

## 2019-05-30 MED ORDER — HYDROMORPHONE BOLUS VIA INFUSION: 2.0000 mg | INTRAVENOUS | Status: DC | PRN

## 2019-05-30 MED ORDER — GLYCOPYRROLATE 1 MG PO TABS
1.0000 mg | ORAL_TABLET | ORAL | Status: DC | PRN
  Filled 2019-05-30: qty 1

## 2019-05-30 MED ORDER — LORAZEPAM 2 MG/ML IJ SOLN
1.0000 mg | INTRAMUSCULAR | Status: DC | PRN
  Administered 2019-05-30: 1 mg via INTRAVENOUS
  Filled 2019-05-30: qty 1

## 2019-05-30 MED ORDER — SODIUM CHLORIDE 0.9 % IV SOLN
2.0000 mg/h | INTRAVENOUS | Status: DC
  Filled 2019-05-30: qty 2.5

## 2019-05-30 MED ORDER — HALOPERIDOL LACTATE 2 MG/ML PO CONC
0.5000 mg | ORAL | Status: DC | PRN
  Filled 2019-05-30: qty 0.3

## 2019-05-30 MED ORDER — HYDROMORPHONE BOLUS VIA INFUSION
1.0000 mg | INTRAVENOUS | Status: DC | PRN
  Administered 2019-05-30 (×5): 1 mg via INTRAVENOUS
  Filled 2019-05-30: qty 1

## 2019-05-30 MED ORDER — SODIUM CHLORIDE 0.9 % IV SOLN
4.0000 mg/h | INTRAVENOUS | Status: DC
  Administered 2019-05-30: 2 mg/h via INTRAVENOUS
  Filled 2019-05-30 (×2): qty 5

## 2019-05-30 MED ORDER — LORAZEPAM 2 MG/ML PO CONC: 1.0000 mg | ORAL | Status: DC | PRN

## 2019-05-30 MED ORDER — HALOPERIDOL LACTATE 5 MG/ML IJ SOLN: 0.5000 mg | INTRAMUSCULAR | Status: DC | PRN

## 2019-05-30 MED ORDER — BIOTENE DRY MOUTH MT LIQD: 15.0000 mL | OROMUCOSAL | Status: DC | PRN

## 2019-05-30 MED ORDER — ACETAMINOPHEN 650 MG RE SUPP: 650.0000 mg | Freq: Four times a day (QID) | RECTAL | Status: DC | PRN

## 2019-05-30 MED ORDER — HYDROMORPHONE BOLUS VIA INFUSION
1.0000 mg | INTRAVENOUS | Status: DC | PRN
  Filled 2019-05-30: qty 1

## 2019-05-30 MED ORDER — POLYVINYL ALCOHOL 1.4 % OP SOLN
1.0000 [drp] | Freq: Four times a day (QID) | OPHTHALMIC | Status: DC | PRN
  Filled 2019-05-30: qty 15

## 2019-05-30 MED ORDER — SODIUM CHLORIDE 0.9 % IV SOLN
0.5000 mg/h | INTRAVENOUS | Status: DC
  Administered 2019-05-30: 0.5 mg/h via INTRAVENOUS
  Filled 2019-05-30: qty 5

## 2019-05-30 NOTE — Plan of Care (Signed)
  Problem: Clinical Measurements: Goal: Respiratory complications will improve Outcome: Progressing  Airway clears with NTS Problem: Clinical Measurements: Goal: Cardiovascular complication will be avoided Outcome: Not Progressing  A-fib with periods of unsustained RVR Problem: Pain Managment: Goal: General experience of comfort will improve Outcome: Progressing  Pt denies pain

## 2019-05-30 NOTE — Progress Notes (Signed)
PULMONARY / CRITICAL CARE MEDICINE    Name: Zachary Bean MRN: [id number] DOB: [birth year]    ADMISSION DATE:  05/30/2019 CONSULTATION DATE:  05/27/19  CHIEF COMPLAINT:  Pulmonary sepsis  HISTORY OF PRESENT ILLNESS:    79 yo male PMH lung cancer s/p left pneumonectomy 25 years prior, complicated by pericardial effusion and left bronchopleural fistula, he has had pericardial and pleural windows placed.  Recently hospitalized at [facility] where he was found to have AFRVR was cardioverted also noted to have LAA thrombus and MSSA infection around pleural window.  He presented here with septic shock from pneumonia and cardiac arrest PCCM was consulted for post cardiac arrest care and pulmonary sepsis management.    PAST MEDICAL HISTORY :  He  has a past medical history of Acute idiopathic pericarditis (2008), Childhood asthma, Chronic kidney disease, Chronic lower back pain, Chronic sinusitis, Duodenal ulcer, chronic, GERD (gastroesophageal reflux disease), Headache, History of kidney stones (1960'S), Hyperlipidemia, Hypothyroidism, Left trigger finger, Lung cancer ([place]) (1995), Normal echocardiogram (08/2011), Normal nuclear stress test (08/2011), NSVT (nonsustained ventricular tachycardia) ([place]), PAF (paroxysmal atrial fibrillation) ([place]) (10 YRS AGO NOT CURRENT PROBLEM), and Type II diabetes mellitus ([place]).  PAST SURGICAL HISTORY: He  has a past surgical history that includes Pneumonectomy (Left, 1995); Lumbar microdiscectomy (1984); Back surgery; Cataract extraction w/ intraocular lens  implant, bilateral (Bilateral); Cardiac catheterization (N/A, 07/09/2015); Atrial fibrillation ablation (early 2000s); LEFT LUNG REMOVED ; Inguinal hernia repair (Left, 2000); SKIN CANCER AREAS REMOVED FROM EAR AND CHEST; Inguinal hernia repair (Right, 11/11/2017); and Insertion of mesh (Right, 11/11/2017).  Allergies  Allergen Reactions  . Iodinated Diagnostic Agents Hives, Itching and Other (See Comments)    Other Reaction:  stinging  . Doxycycline Nausea And Vomiting  . Empagliflozin     DIZZINESS, HEACAHES  Other reaction(s): headache "Jardiance"    No current facility-administered medications on file prior to encounter.   Current Outpatient Medications on File Prior to Encounter  Medication Sig  . acetaminophen (TYLENOL) 500 MG tablet Take 500 mg by mouth every 6 (six) hours as needed for moderate pain or headache.  Marland Kitchen amiodarone (PACERONE) 200 MG tablet Take 200 mg by mouth daily.  . budesonide-formoterol (SYMBICORT) 160-4.5 MCG/ACT inhaler Inhale 2 puffs into the lungs 2 (two) times daily as needed (for shortness of breath).  . clotrimazole (LOTRIMIN) 1 % cream Apply 1 application topically 2 (two) times daily.  Marland Kitchen diltiazem (CARDIZEM CD) 360 MG 24 hr capsule Take 360 mg by mouth daily.  Marland Kitchen ELIQUIS 5 MG TABS tablet Take 5 mg by mouth 2 (two) times daily.  . fluconazole (DIFLUCAN) 100 MG tablet Take 100 mg by mouth daily.  Marland Kitchen gabapentin (NEURONTIN) 100 MG capsule Take 100 mg by mouth 3 (three) times daily.  . Insulin Aspart FlexPen 100 UNIT/ML SOPN Inject 4 Units into the skin 3 (three) times daily.  Marland Kitchen LANTUS SOLOSTAR 100 UNIT/ML Solostar Pen Inject 12 Units into the skin daily.  Marland Kitchen linezolid (ZYVOX) 600 MG tablet Take 600 mg by mouth 2 (two) times daily. For 7 days  . metoprolol succinate (TOPROL-XL) 25 MG 24 hr tablet Take 75 mg by mouth 2 (two) times daily.   . nitroGLYCERIN (NITROSTAT) 0.4 MG SL tablet Place 1 tablet (0.4 mg total) under the tongue every 5 (five) minutes as needed for chest pain. (Patient taking differently: Place 0.4 mg under the tongue See admin instructions. As emergency direction)  . oxyCODONE (OXY IR/ROXICODONE) 5 MG immediate release tablet Take 5 mg by mouth every  4 (four) hours as needed for pain.  Marland Kitchen senna-docusate (SENOKOT-S) 8.6-50 MG tablet Take 1 tablet by mouth daily.  . ONE TOUCH ULTRA TEST test strip   . traMADol (ULTRAM) 50 MG tablet Take 1 tablet (50 mg total) by mouth  every 6 (six) hours as needed for moderate pain or severe pain. (Patient not taking: Reported on 05/28/2019)    FAMILY HISTORY:  His He indicated that his mother is deceased. He indicated that his father is deceased. He indicated that his sister is deceased. He indicated that his brother is deceased. He indicated that his daughter is alive. He indicated that his son is alive.   SOCIAL HISTORY: He  reports that he quit smoking about 25 years ago. His smoking use included cigarettes. He has a 40.00 pack-year smoking history. He has quit using smokeless tobacco.  His smokeless tobacco use included chew. He reports previous alcohol use. He reports that he does not use drugs.  REVIEW OF SYSTEMS:   Neuro status prohibitive  SUBJECTIVE:  Difficulty clearing secretions since extubation requiring multiple NT suction procedures.  Decline in mental status this morning, not following commands, appears tired   VITAL SIGNS: BP 139/66   Pulse (!) 117   Temp 97.7 F (36.5 C) (Axillary)   Resp 17   Ht 5\' 10"  (1.778 m)   Wt 81.4 kg   SpO2 100%   BMI 25.75 kg/m   HEMODYNAMICS: CVP:  [0 mmHg-15 mmHg] 0 mmHg  VENTILATOR SETTINGS: Vent Mode: PRVC FiO2 (%):  [40 %] 40 % Set Rate:  [16 bmp] 16 bmp Vt Set:  [500 mL] 500 mL PEEP:  [5 cmH20] 5 cmH20 Plateau Pressure:  [20 cmH20-25 cmH20] 20 cmH20  INTAKE / OUTPUT: I/O last 3 completed shifts: In: 3284.5 [I.V.:2938.6; IV Piggyback:345.9] Out: 2035 [Urine:2035]  PHYSICAL EXAMINATION: General:  Cachetic chronically ill appearing male Neuro:  Staring ahead, not following commands  Cardiovascular:  Irregularly irregular, increased rate, no MRG, 2-3+ LE edema bilaterally also some upper extremity edema Lungs:  Right lung clear, left lung decreased BS at base, decreased bs upper lobes, slow shallow breaths with gurgling appreciated Abdomen:  Soft, non distended Skin:bandage from pleural window wound with some serosanguenous  drainage   LABS:  BMET Recent Labs  Lab 05/28/19 0103 05/28/19 1913 05/29/19 0330  NA 130* 130* 129*  K 5.4* 4.6 4.5  CL 101 99 100  CO2 18* 18* 19*  BUN 32* 41* 47*  CREATININE 2.31* 3.14* 3.38*  GLUCOSE 102* 100* 139*    Electrolytes Recent Labs  Lab 05/28/19 0103 05/28/19 1913 05/29/19 0330 05/29/19 1800  CALCIUM 7.3* 7.5* 7.3*  --   MG 1.9 2.1 2.0 1.9  PHOS  --  5.2* 5.0* 4.6    CBC Recent Labs  Lab 05/28/19 1913 05/29/19 0330 05/30/19 0455  WBC 6.8 7.3 4.2  HGB 9.2* 9.2* 8.1*  HCT 30.1* 30.3* 25.4*  PLT 157 155 125*    Coag's Recent Labs  Lab 05/29/19 0330 05/29/19 2020 05/30/19 0455  APTT 61* 87* 117*    Sepsis Markers Recent Labs  Lab 05/27/19 1754 05/27/19 2054 05/28/19 1321  LATICACIDVEN 6.2* 4.8* 3.7*    ABG Recent Labs  Lab 05/27/19 0105 05/27/19 0526  PHART 7.362 7.240*  PCO2ART 30.7* 47.9  PO2ART 138* 419.0*    Liver Enzymes Recent Labs  Lab 05/27/19 1756 05/28/19 1913 05/29/19 0330  AST 3,578* 1,681* 1,286*  ALT 1,576* 1,295* 1,175*  ALKPHOS 189* 209* 203*  BILITOT 1.0  0.9 0.9  ALBUMIN 1.7* 1.5* 1.5*    Cardiac Enzymes No results for input(s): TROPONINI, PROBNP in the last 168 hours.  Glucose Recent Labs  Lab 05/29/19 0737 05/29/19 1118 05/29/19 1635 05/29/19 1941 05/29/19 2357 05/30/19 0405  GLUCAP 113* 126* 173* 159* 152* 122*    Imaging DG Chest Port 1 View  Result Date: 05/30/2019 CLINICAL DATA:  Left pneumonectomy. EXAM: PORTABLE CHEST 1 VIEW COMPARISON:  05/28/2019.  05/27/2019.  CT 06/14/2019. FINDINGS: Interim extubation and removal of NG tube. Right IJ line stable position. Heart size stable. Left pneumonectomy. Packing material again noted the left chest. Reference is made to CT report of 06/16/2019. Persistent right base atelectasis/infiltrate. Mild infiltrate right upper lobe cannot be excluded. Tiny right pleural effusion. No pneumothorax. Pancreatic calcification. Surgical clips noted  over the chest. No acute bony abnormality. IMPRESSION: 1. Interim extubation removal of NG tube. Right IJ line stable position. 2. Left pneumonectomy. Packing material again noted the left chest. Reference is made to CT report of 06/08/2019. 3. Persistent right base atelectasis infiltrate. Mild infiltrate right upper lobe cannot be excluded. Tiny right pleural Electronically Signed   By: Marcello Moores  Register   On: 05/30/2019 05:43   CULTURES: Blood-NGTD Wound-Pseudomonas  Susceptible to cefepime  ANTIBIOTICS: Cefepime 1/1-->continued Vancomycin 1/1-1/3  ASSESSMENT / PLAN:  NEUROLOGIC A:   PEA arrest: likely related to hypoxemia and hypotension in the setting of pulmonary sepsis.  He was intubated, sedated and started on TTM initially with improvement in neurological status however post extubation 1/4 has had a decline  P:   -repeat ABG to assess for CO2 retention  PULMONARY A: Pneumonia/Empyema: continues on cefepime, receiving dressing changes with packing daily.  Initially Intubated for cardiac arrest required minimal vent support on PRVC . His pressor support restarted 1/3 currently on 5 of levo.  Extubated 1/4 having trouble clearing secretions  P:   -NT suctioning -ABG -continue wound care with below plan: Per [facility] wound recommendations: Site: Left pleural window surgical wound: Staples to remain in place until seen in clinic (2-3 weeks after discharge). Pack wound every Monday, Wednesday, Friday with 2 kerlex tied together (to ensure they are able to be removed), Soaked in [employer] solution (moderately soaked), packed into site and covered ABD. Periwound skin care per WOC:L flank periwound care 1. Remove dressing with Adhesive Releaser spray (SAP # [id number]) 2. "Crust" skin by lightly dusting with Nystatin Powder (Pharmacy) and spraying with No Sting barrier film (SAP # [id number]). 3. Top with hydrophilic foam (4x4 - SAP # [id number], 4x8 - SAP # [id number]) 4. Secure with Medipore tape (SAP #  [id number]). On discharge, will need to arrange follow-up with [facility]   CARDIOVASCULAR A: Shock/Afib: Initially question of mixed shock given that he is overtly volume overloaded on exam however ECHO points away from this.  Additionally he has been in and out of Atrial fibrillation, he has converted back to NSR.  ECHO with EF 60-65%, RV volume overload with moderate PAH systolic pressure, IVC collapsible, no RWMA.  Back in Afib 1/4 evening  P:  -continue amiodarone, switched from eliquis to heparin drip due to liver injury cardiology following -diurese if able  HEMATOLOGIC A: Anemia: CBC this am shows a stable normocytic anemia.  Transfusion halted yesterday evening due to erroneous prior lab result.  Anemia likely combination of chronic blood loss from wound drainage.  May be as well as a a component of anemia of chronic disease and bone marrow suppression from infection. Heparin started 1/3  for afib  P:  -Continue to monitor  RENAL/Metabolic/Endocrine A:   JSR:PRXYVO renal function at baseline, now with AKI due to hypoperfusion.  Discussed with the family if renal function continues to decline would they/the patient want dialysis and they have decided no not at this time.  Lasix/albumin challenge 1/4 increased urine output, renal function worsened Hyperkalemia: in the setting of renal injury due to above, improved on lokelma  T2DM  P:   -Pressor support PRN to maintain MAP >65 -follow renal function decide on further diuresis -lokelma at maintenance dose  -no dialysis or aggressive measures -lantus  9, SSI  GASTROINTESTINAL A:  Shock Liver: severe transaminitis consistent with shock liver Nutrition: On tube feeding protocol initially NG removed 1/4  P:   -pressor support PRN continue to monitor bp map >65 goal transaminitis improving -NPO for now given acute worsening  FAMILY  - Updates: continued deterioration since yesterday, Poor overall prognosis will discuss with family  -  Inter-disciplinary family meet or Palliative Care meeting due by:  day 7  Pulmonary and [place] Pager: [phone] Vickki Muff MD PGY-3 Internal Medicine Pager # [phone]   05/30/2019, 7:22 AM

## 2019-05-30 NOTE — Progress Notes (Signed)
[place] for Heparin Indication: atrial fibrillation  Allergies  Allergen Reactions  . Iodinated Diagnostic Agents Hives, Itching and Other (See Comments)    Other Reaction: stinging  . Doxycycline Nausea And Vomiting  . Empagliflozin     DIZZINESS, HEACAHES  Other reaction(s): headache "Jardiance"    Patient Measurements: Height: _0  (177.8 cm) Weight: 179 lb 7.3 oz (81.4 kg) IBW/kg (Calculated) : 73  Vital Signs: Temp: 97.7 F (36.5 C) (01/05 0408) Temp Source: Axillary (01/05 0408) BP: 139/66 (01/05 0600) Pulse Rate: 117 (01/05 0600)  Labs: Recent Labs    05/27/19 1756 05/27/19 1756 05/28/19 0103 05/28/19 0103 05/28/19 1913 05/29/19 0330 05/29/19 2020 05/30/19 0455  HGB  --    < > 6.2*  --  9.2* 9.2*  --  8.1*  HCT  --    < > 20.1*  --  30.1* 30.3*  --  25.4*  PLT  --    < > 131*  --  157 155  --  125*  APTT  --   --   --    < > 44* 61* 87* 117*  HEPARINUNFRC  --   --   --   --   --  >2.20*  --   --   CREATININE 2.27*  --  2.31*  --  3.14* 3.38*  --   --   TROPONINIHS 47*  --  41*  --   --   --   --   --    < > = values in this interval not displayed.    Estimated Creatinine Clearance: 19.2 mL/min (A) (by C-G formula based on SCr of 3.38 mg/dL (H)).   Medical History: Past Medical History:  Diagnosis Date  . Acute idiopathic pericarditis 2008   s/p fluoroscopic pericardial window at [facility]  . Childhood asthma   . Chronic kidney disease    STAGE 2 CKD FOLLOWED BY PRIMARY DR Wynelle Link  . Chronic lower back pain   . Chronic sinusitis   . Duodenal ulcer, chronic   . GERD (gastroesophageal reflux disease)   . Headache    "at least once/week; sometimes more" (07/09/2015)  . History of kidney stones 1960'S  . Hyperlipidemia   . Hypothyroidism   . Left trigger finger   . Lung cancer ([place]) 1995   s/p left pneumonectomy in 1995; also treated with radiation  . Normal echocardiogram 08/2011  . Normal nuclear  stress test 08/2011   EF was slightly down but normal by echo; no ischemia or scar noted.   Marland Kitchen NSVT (nonsustained ventricular tachycardia) ([place])   . PAF (paroxysmal atrial fibrillation) ([place]) 10 YRS AGO NOT CURRENT PROBLEM   s/p ablation at [facility]  . Type II diabetes mellitus ([facility])     Medications:  Scheduled:  . sodium chloride   Intravenous Once  . baclofen  10 mg Per Tube TID  . chlorhexidine gluconate (MEDLINE KIT)  15 mL Mouth Rinse BID  . Chlorhexidine Gluconate Cloth  6 each Topical Daily  . Chlorhexidine Gluconate Cloth  6 each Topical Q0600  . feeding supplement (PRO-STAT SUGAR FREE 64)  30 mL Per Tube TID  . furosemide  40 mg Intravenous Q8H  . insulin aspart  0-9 Units Subcutaneous TID WC  . insulin glargine  9 Units Subcutaneous QHS  . levothyroxine  100 mcg Oral Q0600  . mouth rinse  15 mL Mouth Rinse 10 times per day  . pantoprazole sodium  40 mg  Per Tube Daily  . sodium chloride flush  3 mL Intravenous Once  . sodium zirconium cyclosilicate  5 g Oral Daily    Assessment: 50 yoM on Eliquis PTA for hx AFib and LAA thrombus transitioned to heparin with liver dysfunction and possible need for procedures  Heparin level remains falsely elevated by recent DOAC use, aPTT is now elevated at 117 seconds, CBC stable.  Goal of Therapy:  APTT 66-102 sec Heparin level 0.3-0.7 units/ml Monitor platelets by anticoagulation protocol: Yes   Plan:  -Reduce heparin to 1050 units/hr -Recheck aPTT in [place], PharmD, BCPS Clinical Pharmacist [phone] Please check AMION for all [facility] Pharmacy numbers 05/30/2019

## 2019-05-30 NOTE — Progress Notes (Signed)
Patient was placed on comfort care around 1100. Cardiac monitoring continues, but all med drips have been discontinued. He awoke to voice this morning but seemed in distress.  When Daughter and Wife arrived they commented on how uncomfortable he looked.  Prescribed for pain was Dilaudid 2mg /hr with 1mg  bolus PRN q 29min. Patient has received two bolus so far.  Changing patient pillows d/t weeping and changing patient blankets and pillows under his feet do to weeping causes him to stir and move with agitation. Family at bedside were concerned about his pain level.  Observed pain level based upon facial expressions.  Patient now resting.

## 2019-05-30 NOTE — Progress Notes (Signed)
Progress Note  Patient Name: Zachary Bean Date of Encounter: 05/30/2019  Primary Cardiologist: Dr Audie Box  Subjective   Extubated; opens eyes; no response to verbal stimuli  Inpatient Medications    Scheduled Meds: . sodium chloride   Intravenous Once  . baclofen  10 mg Per Tube TID  . chlorhexidine gluconate (MEDLINE KIT)  15 mL Mouth Rinse BID  . Chlorhexidine Gluconate Cloth  6 each Topical Daily  . Chlorhexidine Gluconate Cloth  6 each Topical Q0600  . feeding supplement (PRO-STAT SUGAR FREE 64)  30 mL Per Tube TID  . furosemide  40 mg Intravenous Q8H  . insulin aspart  0-9 Units Subcutaneous TID WC  . insulin glargine  9 Units Subcutaneous QHS  . levothyroxine  100 mcg Oral Q0600  . mouth rinse  15 mL Mouth Rinse 10 times per day  . pantoprazole sodium  40 mg Per Tube Daily  . sodium chloride flush  3 mL Intravenous Once  . sodium zirconium cyclosilicate  5 g Oral Daily   Continuous Infusions: . sodium chloride 20 mL/hr at 05/29/19 0600  . albumin human 60 mL/hr at 05/30/19 0600  . amiodarone 30 mg/hr (05/30/19 0635)  . ceFEPime (MAXIPIME) IV Stopped (05/29/19 0904)  . feeding supplement (VITAL AF 1.2 CAL)    . fentaNYL infusion INTRAVENOUS Stopped (05/29/19 1323)  . heparin 1,150 Units/hr (05/30/19 0600)  . norepinephrine (LEVOPHED) Adult infusion 5 mcg/min (05/30/19 0600)  . sodium chloride     PRN Meds: acetaminophen, fentaNYL, glucagon (human recombinant), lubiprostone   Vital Signs    Vitals:   05/30/19 0408 05/30/19 0500 05/30/19 0600 05/30/19 0742  BP:  135/72 139/66   Pulse:  (!) 130 (!) 117   Resp:  (!) 29 17   Temp: 97.7 F (36.5 C)   97.9 F (36.6 C)  TempSrc: Axillary   Oral  SpO2:  100% 100%   Weight:      Height:        Intake/Output Summary (Last 24 hours) at 05/30/2019 0752 Last data filed at 05/30/2019 0600 Gross per 24 hour  Intake 1749.14 ml  Output 1935 ml  Net -185.86 ml   Last 3 Weights 05/30/2019 05/29/2019 06/14/2019  Weight  (lbs) 179 lb 7.3 oz 179 lb 10.8 oz 176 lb  Weight (kg) 81.4 kg 81.5 kg 79.833 kg      Telemetry    Atrial fibrillation with RVR- Personally Reviewed  Physical Exam   GEN: Opens eyes; no response to verbal stimuli Neck: no TM Cardiac: irregular and tachycardic Respiratory: diminished BS throughout GI: Soft, no masses MS: 2+ edema Neuro:  No response to verbal stimuli   Labs    High Sensitivity Troponin:   Recent Labs  Lab 06/21/2019 1106 06/09/2019 1306 05/27/19 0147 05/27/19 1756 05/28/19 0103  TROPONINIHS '12 14 17 ' 47* 41*      Chemistry Recent Labs  Lab 05/28/19 1913 05/29/19 0330 05/30/19 0455  NA 130* 129* 132*  K 4.6 4.5 4.0  CL 99 100 99  CO2 18* 19* 20*  GLUCOSE 100* 139* 126*  BUN 41* 47* 62*  CREATININE 3.14* 3.38* 4.20*  CALCIUM 7.5* 7.3* 7.6*  PROT 5.3* 5.3* 6.1*  ALBUMIN 1.5* 1.5* 2.3*  AST 1,681* 1,286* 496*  ALT 1,295* 1,175* 796*  ALKPHOS 209* 203* 235*  BILITOT 0.9 0.9 0.9  GFRNONAA 18* 17* 13*  GFRAA 21* 19* 15*  ANIONGAP '13 10 13     ' Hematology Recent Labs  Lab 05/28/19 1913  05/29/19 0330 05/30/19 0455  WBC 6.8 7.3 4.2  RBC 3.28* 3.23* 2.83*  HGB 9.2* 9.2* 8.1*  HCT 30.1* 30.3* 25.4*  MCV 91.8 93.8 89.8  MCH 28.0 28.5 28.6  MCHC 30.6 30.4 31.9  RDW 15.1 15.2 15.2  PLT 157 155 125*    BNP Recent Labs  Lab 06/03/2019 1106 05/27/19 2054  BNP 175.6* 335.1*    Radiology    DG Chest Port 1 View  Result Date: 05/30/2019 CLINICAL DATA:  Left pneumonectomy. EXAM: PORTABLE CHEST 1 VIEW COMPARISON:  05/28/2019.  05/27/2019.  CT 05/28/2019. FINDINGS: Interim extubation and removal of NG tube. Right IJ line stable position. Heart size stable. Left pneumonectomy. Packing material again noted the left chest. Reference is made to CT report of 06/06/2019. Persistent right base atelectasis/infiltrate. Mild infiltrate right upper lobe cannot be excluded. Tiny right pleural effusion. No pneumothorax. Pancreatic calcification. Surgical clips  noted over the chest. No acute bony abnormality. IMPRESSION: 1. Interim extubation removal of NG tube. Right IJ line stable position. 2. Left pneumonectomy. Packing material again noted the left chest. Reference is made to CT report of 06/19/2019. 3. Persistent right base atelectasis infiltrate. Mild infiltrate right upper lobe cannot be excluded. Tiny right pleural Electronically Signed   By: Marcello Moores  Register   On: 05/30/2019 05:43   DG CHEST PORT 1 VIEW  Result Date: 05/28/2019 CLINICAL DATA:  Check central line placement EXAM: PORTABLE CHEST 1 VIEW COMPARISON:  05/27/2019 FINDINGS: Endotracheal tube and gastric catheter are noted. Right jugular central line is noted in the distal superior vena cava. Postsurgical changes are noted on the left. Packing material is seen within the left hemithorax. Right lung is well aerated with increasing right lower lobe atelectasis. No pneumothorax is seen. No bony abnormality is noted. IMPRESSION: Tubes and lines as described in satisfactory position. Increasing right basilar infiltrate when compared with the prior exam. Status post left pneumonectomy. Electronically Signed   By: Inez Catalina M.D.   On: 05/28/2019 18:50   DG Abd Portable 1V  Result Date: 05/28/2019 CLINICAL DATA:  OG tube placement EXAM: PORTABLE ABDOMEN - 1 VIEW COMPARISON:  May 27, 2019 FINDINGS: The OG tube tip projects over the gastric body. The tip is pointed distally. Oral contrast is noted within the stomach. The visualized bowel gas pattern is nonspecific. IMPRESSION: OG tube projects over the stomach. Electronically Signed   By: Constance Holster M.D.   On: 05/28/2019 21:18   ECHOCARDIOGRAM COMPLETE  Result Date: 05/28/2019   ECHOCARDIOGRAM REPORT   Patient Name:   Zachary Bean Date of Exam: 05/28/2019 Medical Rec #:  [id number]     Height:       70.0 in Accession #:    [id number]    Weight:       176.0 lb Date of Birth:  [birth year]     BSA:          1.98 m Patient Age:    3 years      BP:            109/58 mmHg Patient Gender: M             HR:           82 bpm. Exam Location:  Inpatient Procedure: 2D Echo Indications:    cardiac arrest  History:        Patient has prior history of Echocardiogram examinations, most  recent 07/10/2015. Arrythmias:Atrial Fibrillation;                 Signs/Symptoms:Shortness of Breath.  Sonographer:    Johny Chess Referring Phys: [id number] CHI JANE ELLISON  Sonographer Comments: Echo performed with patient supine and on artificial respirator. IMPRESSIONS  1. Left ventricular ejection fraction, by visual estimation, is 60 to 65%. The left ventricle has normal function. There is no left ventricular hypertrophy.  2. Right ventricular volume and pressure overload.  3. The left ventricle has no regional wall motion abnormalities.  4. Global right ventricle has normal systolic function.The right ventricular size is normal. No increase in right ventricular wall thickness.  5. Left atrial size was normal.  6. Right atrial size was normal.  7. The mitral valve is normal in structure. Mild mitral valve regurgitation. No evidence of mitral stenosis.  8. The tricuspid valve is normal in structure.  9. The aortic valve is normal in structure. Aortic valve regurgitation is not visualized. No evidence of aortic valve sclerosis or stenosis. 10. The pulmonic valve was normal in structure. Pulmonic valve regurgitation is not visualized. 11. Moderately elevated pulmonary artery systolic pressure. 12. The tricuspid regurgitant velocity is 2.82 m/s, and with an assumed right atrial pressure of 15 mmHg, the estimated right ventricular systolic pressure is moderately elevated at 46.8 mmHg. 13. The inferior vena cava is normal in size with greater than 50% respiratory variability, suggesting right atrial pressure of 3 mmHg. FINDINGS  Left Ventricle: Left ventricular ejection fraction, by visual estimation, is 60 to 65%. The left ventricle has normal function. The left ventricle  has no regional wall motion abnormalities. There is no left ventricular hypertrophy. The interventricular septum is flattened in systole and diastole, consistent with right ventricular pressure and volume overload. Left ventricular diastolic parameters were normal. Normal left atrial pressure. Right Ventricle: The right ventricular size is normal. No increase in right ventricular wall thickness. Global RV systolic function is has normal systolic function. The tricuspid regurgitant velocity is 2.82 m/s, and with an assumed right atrial pressure  of 15 mmHg, the estimated right ventricular systolic pressure is moderately elevated at 46.8 mmHg. Left Atrium: Left atrial size was normal in size. Right Atrium: Right atrial size was normal in size Pericardium: There is no evidence of pericardial effusion. Mitral Valve: The mitral valve is normal in structure. Mild mitral valve regurgitation. No evidence of mitral valve stenosis by observation. Tricuspid Valve: The tricuspid valve is normal in structure. Tricuspid valve regurgitation is mild. Aortic Valve: The aortic valve is normal in structure. Aortic valve regurgitation is not visualized. The aortic valve is structurally normal, with no evidence of sclerosis or stenosis. Pulmonic Valve: The pulmonic valve was normal in structure. Pulmonic valve regurgitation is not visualized. Pulmonic regurgitation is not visualized. Aorta: The aortic root, ascending aorta and aortic arch are all structurally normal, with no evidence of dilitation or obstruction. Venous: The inferior vena cava is normal in size with greater than 50% respiratory variability, suggesting right atrial pressure of 3 mmHg. IAS/Shunts: No atrial level shunt detected by color flow Doppler. There is no evidence of a patent foramen ovale. No ventricular septal defect is seen or detected. There is no evidence of an atrial septal defect.  LEFT VENTRICLE PLAX 2D LVIDd:         3.90 cm  Diastology LVIDs:         3.10  cm  LV e' lateral:   7.94 cm/s LV PW:  0.90 cm  LV E/e' lateral: 13.0 LV IVS:        0.80 cm  LV e' medial:    8.38 cm/s LVOT diam:     2.10 cm  LV E/e' medial:  12.3 LV SV:         28 ml LV SV Index:   14.04 LVOT Area:     3.46 cm  RIGHT VENTRICLE RV S prime:     14.50 cm/s TAPSE (M-mode): 1.6 cm LEFT ATRIUM           Index       RIGHT ATRIUM           Index LA diam:      3.60 cm 1.82 cm/m  RA Area:     17.10 cm LA Vol (A4C): 47.3 ml 23.93 ml/m RA Volume:   45.70 ml  23.12 ml/m  AORTIC VALVE LVOT Vmax:   80.30 cm/s LVOT Vmean:  50.100 cm/s LVOT VTI:    0.146 m  AORTA Ao Root diam: 3.00 cm Ao Asc diam:  2.90 cm MITRAL VALVE                         TRICUSPID VALVE MV Area (PHT): 3.72 cm              TR Peak grad:   31.8 mmHg MV PHT:        59.16 msec            TR Vmax:        282.00 cm/s MV Decel Time: 204 msec MV E velocity: 103.00 cm/s 103 cm/s  SHUNTS MV A velocity: 41.60 cm/s  70.3 cm/s Systemic VTI:  0.15 m MV E/A ratio:  2.48        1.5       Systemic Diam: 2.10 cm  Ena Dawley MD Electronically signed by Ena Dawley MD Signature Date/Time: 05/28/2019/4:49:20 PM    Final     Patient Profile     Zachary Bean is a 79 y.o. 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 Overnight 1/2 he suffered a PEA arrest likely related to hypoxic respiratory failure. Was noted to have bradycardia around the time of arrest. Course complicated by acute kidney injury, acute liver injury, afib with RVR in setting of cardiac arrest.   Echocardiogram shows normal LV function and mild mitral regurgitation.  Assessment & Plan    1 paroxysmal atrial fibrillation-patient back in atrial fibrillation this  morning and rate is elevated.  Continue amiodarone and IV heparin.    2 status post PEA arrest-this is felt secondary to hypoxemia/respiratory distress.  Echocardiogram shows normal LV function.  Wean norepinephrine as tolerated.  3 acute on chronic stage III kidney disease-likely ATN from recent cardiac arrest.  Renal function continues to deteriorate.  Continue to follow.  4 CODE STATUS-no further escalation in care including no CPR and no dialysis.  If renal function continues to deteriorate will need discussions concerning comfort care.  5 sepsis/pneumonia-antibiotics per critical care.  6 elevated liver functions-likely related to recent cardiac arrest/hypotension.  Improved this a.m.  For questions or updates, please contact [place] Please consult [email or web address] for contact info under  Signed, Kirk Ruths, MD  05/30/2019, 7:52 AM

## 2019-05-30 NOTE — Progress Notes (Signed)
OT Cancellation Note and Discharge  Patient Details Name: Zachary Bean MRN: [id number] DOB: November 22, 1940   Cancelled Treatment:    Reason Eval/Treat Not Completed: Patient not medically ready. Spoke with RN and he feels pt is not appropriate for therapy medically and Dr. Maudie Flakes note from this AM states when family arrives they will make patient comfort care. We will sign off, please re-consult if situation changes.  [facility] OTR/L Acute [employer] Pager [phone] Office [phone]     05/30/2019, 10:30 AM

## 2019-05-30 NOTE — Progress Notes (Signed)
Chaplain provided support to wife and daughter at Mr. Copenhaver bedside.  Chaplain prayed with family. Chaplain helped family put on PPE.  Chaplain will continue to follow-up.

## 2019-05-30 NOTE — Progress Notes (Signed)
Nasotracheal suctioned patients airway.  Patient has lots of thick white secretions he is unable to generate himself.  I was able to retrieve a moderate amount.  Patient is breathing much easier now and airway sounds better.  RT and RN will continue to assess and NTS as needed.

## 2019-05-30 NOTE — Progress Notes (Signed)
PT Cancellation Note  Patient Details Name: Zachary Bean MRN: [id number] DOB: 1941/03/03   Cancelled Treatment:    Reason Eval/Treat Not Completed: Patient not medically ready. Spoke with RN and he feels pt is not appropriate for therapy medically and Dr. Maudie Flakes note from this AM states when family arrives they will make patient comfort care. We will sign off, please re-consult if situation changes.  Mabeline Caras, PT, DPT Acute [facility] Services  Pager [phone] Office [place] 05/30/2019, 10:38 AM

## 2019-05-30 NOTE — Plan of Care (Signed)
  Problem: Clinical Measurements: Goal: Will remain free from infection Outcome: Progressing   Problem: Clinical Measurements: Goal: Respiratory complications will improve Outcome: Progressing   Problem: Clinical Measurements: Goal: Cardiovascular complication will be avoided Outcome: Progressing   

## 2019-05-31 DIAGNOSIS — E440 Moderate protein-calorie malnutrition: Secondary | ICD-10-CM | POA: Insufficient documentation

## 2019-05-31 DIAGNOSIS — J189 Pneumonia, unspecified organism: Secondary | ICD-10-CM

## 2019-05-31 LAB — AEROBIC/ANAEROBIC CULTURE W GRAM STAIN (SURGICAL/DEEP WOUND)

## 2019-05-31 LAB — CULTURE, BLOOD (ROUTINE X 2)
Culture: NO GROWTH
Culture: NO GROWTH
Special Requests: ADEQUATE
Special Requests: ADEQUATE

## 2019-06-01 LAB — TYPE AND SCREEN
ABO/RH(D): A NEG
Antibody Screen: NEGATIVE
Unit division: 0

## 2019-06-01 LAB — BPAM RBC
Blood Product Expiration Date: 202101122359
Unit Type and Rh: 600

## 2019-06-13 ENCOUNTER — Telehealth: Payer: Self-pay

## 2019-06-13 NOTE — Telephone Encounter (Signed)
Received a phone call from [place] @ [facility] [employer].  I informed Secundino Ginger that the dc needs to go to Pulmonary office.  Doctor Ruthann Cancer did the death summary but she is not on the schedule so any doctor at Pulmonary can sign the dc and informed Secundino Ginger this information.

## 2019-06-19 ENCOUNTER — Ambulatory Visit: Payer: Worker's Compensation | Admitting: Oncology

## 2019-06-26 NOTE — Progress Notes (Signed)
Nutrition Brief Note  Chart reviewed. Pt now transitioning to comfort care.  No further nutrition interventions warranted at this time.  Please re-consult as needed.   Mariana Single RD, LDN Clinical Nutrition Pager # [phone]

## 2019-06-26 NOTE — Progress Notes (Signed)
Entered in error

## 2019-06-26 NOTE — Progress Notes (Signed)
PULMONARY / CRITICAL CARE MEDICINE    Name: Zachary Bean MRN: [id number] DOB: 1941/01/26    ADMISSION DATE:  06/14/2019 CONSULTATION DATE:  05/27/19  CHIEF COMPLAINT:  Pulmonary sepsis  HISTORY OF PRESENT ILLNESS:    79 yo male PMH lung cancer s/p left pneumonectomy 25 years prior, complicated by pericardial effusion and left bronchopleural fistula, he has had pericardial and pleural windows placed.  Recently hospitalized at [facility] where he was found to have AFRVR was cardioverted also noted to have LAA thrombus and MSSA infection around pleural window.  He presented here with septic shock from pneumonia and cardiac arrest PCCM was consulted for post cardiac arrest care and pulmonary sepsis management.    PAST MEDICAL HISTORY :  He  has a past medical history of Acute idiopathic pericarditis (2008), Childhood asthma, Chronic kidney disease, Chronic lower back pain, Chronic sinusitis, Duodenal ulcer, chronic, GERD (gastroesophageal reflux disease), Headache, History of kidney stones (1960'S), Hyperlipidemia, Hypothyroidism, Left trigger finger, Lung cancer ([place]) (1995), Normal echocardiogram (08/2011), Normal nuclear stress test (08/2011), NSVT (nonsustained ventricular tachycardia) ([place]), PAF (paroxysmal atrial fibrillation) ([place]) (10 YRS AGO NOT CURRENT PROBLEM), and Type II diabetes mellitus ([place]).  PAST SURGICAL HISTORY: He  has a past surgical history that includes Pneumonectomy (Left, 1995); Lumbar microdiscectomy (1984); Back surgery; Cataract extraction w/ intraocular lens  implant, bilateral (Bilateral); Cardiac catheterization (N/A, 07/09/2015); Atrial fibrillation ablation (early 2000s); LEFT LUNG REMOVED ; Inguinal hernia repair (Left, 2000); SKIN CANCER AREAS REMOVED FROM EAR AND CHEST; Inguinal hernia repair (Right, 11/11/2017); and Insertion of mesh (Right, 11/11/2017).  Allergies  Allergen Reactions  . Iodinated Diagnostic Agents Hives, Itching and Other (See Comments)    Other Reaction:  stinging  . Doxycycline Nausea And Vomiting  . Empagliflozin     DIZZINESS, HEACAHES  Other reaction(s): headache "[place]"    No current facility-administered medications on file prior to encounter.   Current Outpatient Medications on File Prior to Encounter  Medication Sig  . acetaminophen (TYLENOL) 500 MG tablet Take 500 mg by mouth every 6 (six) hours as needed for moderate pain or headache.  Marland Kitchen amiodarone (PACERONE) 200 MG tablet Take 200 mg by mouth daily.  . budesonide-formoterol (SYMBICORT) 160-4.5 MCG/ACT inhaler Inhale 2 puffs into the lungs 2 (two) times daily as needed (for shortness of breath).  . clotrimazole (LOTRIMIN) 1 % cream Apply 1 application topically 2 (two) times daily.  Marland Kitchen diltiazem (CARDIZEM CD) 360 MG 24 hr capsule Take 360 mg by mouth daily.  Marland Kitchen ELIQUIS 5 MG TABS tablet Take 5 mg by mouth 2 (two) times daily.  . fluconazole (DIFLUCAN) 100 MG tablet Take 100 mg by mouth daily.  Marland Kitchen gabapentin (NEURONTIN) 100 MG capsule Take 100 mg by mouth 3 (three) times daily.  . Insulin Aspart FlexPen 100 UNIT/ML SOPN Inject 4 Units into the skin 3 (three) times daily.  Marland Kitchen LANTUS SOLOSTAR 100 UNIT/ML Solostar Pen Inject 12 Units into the skin daily.  Marland Kitchen linezolid (ZYVOX) 600 MG tablet Take 600 mg by mouth 2 (two) times daily. For 7 days  . metoprolol succinate (TOPROL-XL) 25 MG 24 hr tablet Take 75 mg by mouth 2 (two) times daily.   . nitroGLYCERIN (NITROSTAT) 0.4 MG SL tablet Place 1 tablet (0.4 mg total) under the tongue every 5 (five) minutes as needed for chest pain. (Patient taking differently: Place 0.4 mg under the tongue See admin instructions. As emergency direction)  . oxyCODONE (OXY IR/ROXICODONE) 5 MG immediate release tablet Take 5 mg by mouth every  4 (four) hours as needed for pain.  Marland Kitchen senna-docusate (SENOKOT-S) 8.6-50 MG tablet Take 1 tablet by mouth daily.  . ONE TOUCH ULTRA TEST test strip   . traMADol (ULTRAM) 50 MG tablet Take 1 tablet (50 mg total) by mouth  every 6 (six) hours as needed for moderate pain or severe pain. (Patient not taking: Reported on 05/28/2019)    FAMILY HISTORY:  His He indicated that his mother is deceased. He indicated that his father is deceased. He indicated that his sister is deceased. He indicated that his brother is deceased. He indicated that his daughter is alive. He indicated that his son is alive.   SOCIAL HISTORY: He  reports that he quit smoking about 25 years ago. His smoking use included cigarettes. He has a 40.00 pack-year smoking history. He has quit using smokeless tobacco.  His smokeless tobacco use included chew. He reports previous alcohol use. He reports that he does not use drugs.  REVIEW OF SYSTEMS:   Neuro status prohibitive  SUBJECTIVE:  More comfortable than yesterday but still some discomfort occasionally gasps for air  VITAL SIGNS: BP 106/63   Pulse 84   Temp 97.9 F (36.6 C) (Oral)   Resp 15   Ht 5\' 10"  (1.778 m)   Wt 81.4 kg   SpO2 (!) 75%   BMI 25.75 kg/m   HEMODYNAMICS:    VENTILATOR SETTINGS:    INTAKE / OUTPUT: I/O last 3 completed shifts: In: 1168 [I.V.:774.6; IV Piggyback:393.4] Out: 2375 [Urine:2375]  PHYSICAL EXAMINATION: General:  Cachetic chronically ill appearing male Neuro:  Sedated, doesn't appear to be in pain  Cardiovascular: RRR no MRG, 2-3+ LE edema bilaterally also some upper extremity edema Lungs:  decreased breath sounds with gurgling appreciated Abdomen:  Soft, non distended Skin:bandage from pleural window wound with some serosanguenous drainage   LABS:  BMET Recent Labs  Lab 05/28/19 1913 05/29/19 0330 05/30/19 0455  NA 130* 129* 132*  K 4.6 4.5 4.0  CL 99 100 99  CO2 18* 19* 20*  BUN 41* 47* 62*  CREATININE 3.14* 3.38* 4.20*  GLUCOSE 100* 139* 126*    Electrolytes Recent Labs  Lab 05/28/19 1913 05/29/19 0330 05/29/19 1800 05/30/19 0455  CALCIUM 7.5* 7.3*  --  7.6*  MG 2.1 2.0 1.9 2.0  PHOS 5.2* 5.0* 4.6 5.0*     CBC Recent Labs  Lab 05/28/19 1913 05/29/19 0330 05/30/19 0455  WBC 6.8 7.3 4.2  HGB 9.2* 9.2* 8.1*  HCT 30.1* 30.3* 25.4*  PLT 157 155 125*    Coag's Recent Labs  Lab 05/29/19 0330 05/29/19 2020 05/30/19 0455  APTT 61* 87* 117*    Sepsis Markers Recent Labs  Lab 05/27/19 1754 05/27/19 2054 05/28/19 1321  LATICACIDVEN 6.2* 4.8* 3.7*    ABG Recent Labs  Lab 05/27/19 0105 05/27/19 0526  PHART 7.362 7.240*  PCO2ART 30.7* 47.9  PO2ART 138* 419.0*    Liver Enzymes Recent Labs  Lab 05/28/19 1913 05/29/19 0330 05/30/19 0455  AST 1,681* 1,286* 496*  ALT 1,295* 1,175* 796*  ALKPHOS 209* 203* 235*  BILITOT 0.9 0.9 0.9  ALBUMIN 1.5* 1.5* 2.3*    Cardiac Enzymes No results for input(s): TROPONINI, PROBNP in the last 168 hours.  Glucose Recent Labs  Lab 05/29/19 1118 05/29/19 1635 05/29/19 1941 05/29/19 2357 05/30/19 0405 05/30/19 0745  GLUCAP 126* 173* 159* 152* 122* 118*    Imaging No results found. CULTURES: Blood-NGTD Wound-Pseudomonas  Susceptible to cefepime  ANTIBIOTICS: Cefepime 1/1-->1/5 Vancomycin 1/1-1/3  ASSESSMENT / PLAN:  TRANSITIONED TO COMFORT CARE 1/5  NEUROLOGIC A:   PEA arrest: likely related to hypoxemia and hypotension in the setting of pulmonary sepsis.  He was intubated, sedated and started on TTM initially with improvement in neurological status however post extubation 1/4 has had a decline  P:   -increase dilaudid dose to decrease air hunger  PULMONARY A: Pneumonia/Empyema: continues on cefepime, receiving dressing changes with packing daily.  Initially Intubated for cardiac arrest required minimal vent support on PRVC . His pressor support restarted 1/3 currently on 5 of levo.  Extubated 1/4 having trouble clearing secretions  P:   -continue to treat dyspnea, air hunger, increase dilaudid dose  CARDIOVASCULAR A: Shock/Afib: Initially question of mixed shock given that he is overtly volume overloaded on  exam however ECHO points away from this.  Additionally he has been in and out of Atrial fibrillation, he has converted back to NSR.  ECHO with EF 60-65%, RV volume overload with moderate PAH systolic pressure, IVC collapsible, no RWMA.  Back in Afib 1/4 evening  P:  -off pressor support on comfort care  GASTROINTESTINAL A:  Shock Liver: severe transaminitis consistent with shock liver Nutrition: On tube feeding protocol initially NG removed 1/4  P:    -NPO on comfort care  FAMILY  - Updates: family at bedside updated and continued comfort care interventions  Pulmonary and [place] Pager: [phone] Vickki Muff MD PGY-3 Internal Medicine Pager # [phone]   Jun 17, 2019, 9:03 AM

## 2019-06-26 NOTE — Death Summary Note (Signed)
DEATH SUMMARY   Patient Details  Name: Zachary Bean MRN: [id number] DOB: 12/26/1940  Admission/Discharge Information   Admit Date:  Jun 11, 2019  Date of Death: Date of Death: 16-Jun-2019  Time of Death: Time of Death: 0804  Length of Stay: 5  Referring Physician: Jonathon Jordan, MD   Reason(s) for Hospitalization  empyema  Diagnoses  Preliminary cause of death: Respiratory failure, acute ([place]) Secondary Diagnoses (including complications and co-morbidities):  Active Problems:   A-fib (HCC)   SOB (shortness of breath)   Endotracheally intubated   Community acquired pneumonia of left lung   Bradycardia   History of ETT   Malnutrition of moderate degree   Brief Hospital Course (including significant findings, care, treatment, and services provided and events leading to death)  Zachary Bean is a 79 y.o. year old male who per H&P on 1/1 has a past "medical history significant of lung cancer status post left pneumonectomy and radiation therapy in 1993-08-17, pericardial effusion status post pericardial window in 2006-08-18, IDDM, hypothyroidism, paroxysmal A. Fib, who was recently just discharged from [facility] after managing a complicated left bronchopleural fistula, for which patient underwent thoracoscopy and bronchoscopy, and patient was discharged on Zyvox for positive culture of MSSA from the fistula/pleural/skin.  During that admission, he developed uncontrolled a flutter, initially there was a plan for cardioversion since patient A. flutter was very difficult to control with multiple rate control medications, however echocardiogram showed LA thrombosis and plan was aborted. Patient's heart rate eventually was controlled with multiple heart rate control medications.  Patient was then sent home after a left pleural window done and packing for active drainage.  He was told to follow-up with [facility] CT surgeon on January 14.  And instruction for wound packing on Monday Wednesday Friday for dressing  change.  However patient went home and started to feel frequent palpitations, and he became increasingly short of breath for the last 2 days with minimum activity, and any cough no chest pains.  And he found he has increasing drainage from the chest wall opening, but denied any fever chills, no chest pain no abdominal pain no diarrhea, he has 3 days of Zyvox left. ED Course: Poorly controlled A. Fib.  ED cultured the fluid from the chest wall opening, discussed with pharmacy and recommend change antibiotics coverage to empyema."  On 1/2 pt became hypotensive and bradycardic and code blue was called. He was subsequently intubated, sedated and transferred to ICU. CCM was consulted and per their consult note "79 y.o. M with PMH of lung Ca s/p L pneumonectomy and radiation therapy in August 17, 1993 with recent admission to [facility] with complicated L bronchopleural fistula and underwent thoracoscopy and bronchoscopy and discharged home with a pleural window on Zyvox for culture from fistula growing MSSA.  He developed a flutter that was difficult to control during this admission and was discharged on Cardizem and metoprolol.  Initial plan was for cardioversion however echocardiogram showed LA thrombosis.  He presented to the emergency department on 06-10-22 with palpitations and shortness of breath with increased drainage and was admitted for possible empyema, CT chest with new right upper lobe airspace disease.  On admission, heart rate was elevated 110s to 130s.  Patient has been on changing doses of calcium channel blockers and beta-blockers, per pharmacy Cardizem CD 360 mg daily was filled 12/27, and Toprol XL 150 mg daily also filled same day.  On admission, Cardizem 120 mg q 8hrs with metoprolol 100 mg 3 times daily  was ordered.  Unfortunately, both of these medications were given at the same time with Ultram and several hours later patient developed shortness of breath and bradycardia with hypotension.  He  was given 1.5 L of IV fluids.  Around 3 AM heart rate decreased to 20s and patient had decreased level of consciousness.  He was noted to have agonal respirations.  He was intubated and given atropine.  Developed PEA arrest and so CODE BLUE was called and CPR initiated.  CPR sustained for 4 minutes, given epi x1 and ROSC obtained.  Patient transferred to the intensive care unit."  Pt improved enough from resp standpoint to be extubated 1/4 but over the course of the day declined from mental status standpoint and was unable to clear his secretions. He was maintained on pressors and with renal function worsening and family not desiring dialysis for pt decision was made rather than reintubating pt to focus on goals of comfort. Pt expired peacefully and with dignity withfamily at his side on June 30, 2019 at [place].      Pertinent Labs and Studies  Significant Diagnostic Studies DG Chest 2 View  Result Date: 06/15/2019 CLINICAL DATA:  Chest pain. Status post lobectomy. EXAM: CHEST - 2 VIEW COMPARISON:  January 16, 2019. FINDINGS: Stable cardiomediastinal silhouette. No pneumothorax is noted. Right lung is clear. Postsurgical changes are noted in the left lung base. Left basilar opacity is noted concerning for pneumonia or atelectasis with associated effusion. Bony thorax is unremarkable. IMPRESSION: Postsurgical changes are noted in left lung base. Left basilar opacity is noted concerning for pneumonia or atelectasis with associated effusion. Electronically Signed   By: Marijo Conception M.D.   On: 05/29/2019 11:48   CT Chest Wo Contrast  Result Date: 06/22/2019 CLINICAL DATA:  Left pneumonectomy 25 years ago secondary to cancer. Exploratory surgery 5 weeks ago at outside hospital secondary to fistula between the bronchus and esophagus. Substernal chest pain. Shortness of breath. Increased drainage. EXAM: CT CHEST WITHOUT CONTRAST TECHNIQUE: Multidetector CT imaging of the chest was performed following the standard  protocol without IV contrast. COMPARISON:  Chest radiograph earlier today.  03/09/2019 chest CT. FINDINGS: Cardiovascular: Aortic and branch vessel atherosclerosis. Tortuous thoracic aorta. Normal heart size, without pericardial effusion. Multivessel coronary artery atherosclerosis. Mediastinum/Nodes: No supraclavicular adenopathy. No mediastinal or definite hilar adenopathy, given limitations of unenhanced CT. Normal appearance of the esophagus, without communication to the bronchus or pneumonectomy bed identified. Lungs/Pleura: Small right pleural fluid is new since the prior CT. Calcified right upper lobe granuloma. Posterior right upper lobe mild airspace and ground-glass opacity is new, including on 64/6. Subpleural right lower lobe 4 mm pulmonary nodule on 90/6 is new since the prior, most likely a subpleural lymph node. Mild centrilobular emphysema. Left pneumonectomy. Interval evacuation of the fluid within the pneumonectomy bed. There is gas and presumed packing material within the operative bed. Presumed postsurgical communication to the skin surface superiorly on 37/3 and more inferiorly and laterally on 47/3. Upper Abdomen: Mild hepatic steatosis. Normal imaged portions of the spleen, stomach, pancreas, gallbladder, adrenal glands, left kidney. Musculoskeletal: Postsurgical left rib defects. Moderate mid and lower thoracic spondylosis. IMPRESSION: 1. Since 03/09/2019, drainage of the left pneumonectomy bed fluid, with placement of presumed surgical packing material. Postsurgical communication of the operative bed to the posterolateral left chest wall. 2. New small right pleural effusion with inferior right upper lobe airspace disease, suspicious for mild infection or aspiration. 3. Aortic atherosclerosis (ICD10-I70.0), coronary artery atherosclerosis and emphysema (ICD10-J43.9). 4. Hepatic  steatosis. Electronically Signed   By: Abigail Miyamoto M.D.   On: 05/28/2019 14:51   DG Chest Port 1 View  Result  Date: 05/30/2019 CLINICAL DATA:  Left pneumonectomy. EXAM: PORTABLE CHEST 1 VIEW COMPARISON:  05/28/2019.  05/27/2019.  CT 06/25/2019. FINDINGS: Interim extubation and removal of NG tube. Right IJ line stable position. Heart size stable. Left pneumonectomy. Packing material again noted the left chest. Reference is made to CT report of 06/24/2019. Persistent right base atelectasis/infiltrate. Mild infiltrate right upper lobe cannot be excluded. Tiny right pleural effusion. No pneumothorax. Pancreatic calcification. Surgical clips noted over the chest. No acute bony abnormality. IMPRESSION: 1. Interim extubation removal of NG tube. Right IJ line stable position. 2. Left pneumonectomy. Packing material again noted the left chest. Reference is made to CT report of 06/23/2019. 3. Persistent right base atelectasis infiltrate. Mild infiltrate right upper lobe cannot be excluded. Tiny right pleural Electronically Signed   By: Marcello Moores  Register   On: 05/30/2019 05:43   DG CHEST PORT 1 VIEW  Result Date: 05/28/2019 CLINICAL DATA:  Check central line placement EXAM: PORTABLE CHEST 1 VIEW COMPARISON:  05/27/2019 FINDINGS: Endotracheal tube and gastric catheter are noted. Right jugular central line is noted in the distal superior vena cava. Postsurgical changes are noted on the left. Packing material is seen within the left hemithorax. Right lung is well aerated with increasing right lower lobe atelectasis. No pneumothorax is seen. No bony abnormality is noted. IMPRESSION: Tubes and lines as described in satisfactory position. Increasing right basilar infiltrate when compared with the prior exam. Status post left pneumonectomy. Electronically Signed   By: Inez Catalina M.D.   On: 05/28/2019 18:50   DG CHEST PORT 1 VIEW  Result Date: 05/27/2019 CLINICAL DATA:  Endotracheally intubated. EXAM: PORTABLE CHEST 1 VIEW COMPARISON:  Radiograph earlier this day. Chest CT yesterday. FINDINGS: Endotracheal tube tip 4.9 cm from the  carina. History of left pneumonectomy. Packing material and air in the left hemithorax, better characterized on CT yesterday. Right costophrenic angle not included in the field of view, right pleural effusion on CT is not well seen. Minimal patchy opacity in the periphery of the right mid lung corresponds to airspace disease in the right upper lobe. No right pneumothorax. Normal heart size with unchanged mediastinal contours. IMPRESSION: 1. Endotracheal tube tip 4.9 cm from the carina. 2. Prior left pneumonectomy. Packing material and air in the left hemithorax, better characterized on CT yesterday. 3. Minimal patchy opacity in the periphery of the right mid lung corresponds to airspace disease in the right upper lobe on CT. Electronically Signed   By: Keith Rake M.D.   On: 05/27/2019 04:11   DG CHEST PORT 1 VIEW  Result Date: 05/27/2019 CLINICAL DATA:  Shortness of breath EXAM: PORTABLE CHEST 1 VIEW COMPARISON:  06/11/2019, 06/17/2018, CT chest 06/20/2019, 03/09/2019, radiograph 01/16/2019 FINDINGS: Patchy foci of airspace disease in the right mid lung and right base. Interval increased lucency in the left thorax. Patient has a history of left pneumonectomy. Mild opacity at the left lung base remains. Mild shift to the left. Stable cardiomediastinal silhouette. IMPRESSION: 1. Status post left pneumonectomy. Interval increased lucency in the left thorax compared to most recent prior radiograph, possibly due to evacuation of previously noted material within the left chest cavity on chest CT 06/22/2019, and reflecting air within the left pleural cavity. Mild opacity remains at the left base. 2. Small foci of airspace disease in the right mid lung and right base, could reflect  small foci of pneumonia Electronically Signed   By: Donavan Foil M.D.   On: 05/27/2019 01:35   DG Abd Portable 1V  Result Date: 05/28/2019 CLINICAL DATA:  OG tube placement EXAM: PORTABLE ABDOMEN - 1 VIEW COMPARISON:  May 27, 2019  FINDINGS: The OG tube tip projects over the gastric body. The tip is pointed distally. Oral contrast is noted within the stomach. The visualized bowel gas pattern is nonspecific. IMPRESSION: OG tube projects over the stomach. Electronically Signed   By: Constance Holster M.D.   On: 05/28/2019 21:18   DG Abd Portable 1V  Result Date: 05/27/2019 CLINICAL DATA:  OG tube placement EXAM: PORTABLE ABDOMEN - 1 VIEW COMPARISON:  10/04/2014 FINDINGS: Esophageal tube tip and side-port project over the proximal stomach. Mild gaseous dilatation of colon without convincing evidence for obstruction. Moderate stool in the colon. IMPRESSION: Esophageal tube tip and side port overlie the proximal stomach Electronically Signed   By: Donavan Foil M.D.   On: 05/27/2019 21:30   ECHOCARDIOGRAM COMPLETE  Result Date: 05/28/2019   ECHOCARDIOGRAM REPORT   Patient Name:   ABUNDIO TEUSCHER Date of Exam: 05/28/2019 Medical Rec #:  [id number]     Height:       70.0 in Accession #:    [id number]    Weight:       176.0 lb Date of Birth:  August 25, 1940     BSA:          1.98 m Patient Age:    26 years      BP:           109/58 mmHg Patient Gender: M             HR:           82 bpm. Exam Location:  Inpatient Procedure: 2D Echo Indications:    cardiac arrest  History:        Patient has prior history of Echocardiogram examinations, most                 recent 07/10/2015. Arrythmias:Atrial Fibrillation;                 Signs/Symptoms:Shortness of Breath.  Sonographer:    Johny Chess Referring Phys: [id number] CHI JANE ELLISON  Sonographer Comments: Echo performed with patient supine and on artificial respirator. IMPRESSIONS  1. Left ventricular ejection fraction, by visual estimation, is 60 to 65%. The left ventricle has normal function. There is no left ventricular hypertrophy.  2. Right ventricular volume and pressure overload.  3. The left ventricle has no regional wall motion abnormalities.  4. Global right ventricle has normal systolic  function.The right ventricular size is normal. No increase in right ventricular wall thickness.  5. Left atrial size was normal.  6. Right atrial size was normal.  7. The mitral valve is normal in structure. Mild mitral valve regurgitation. No evidence of mitral stenosis.  8. The tricuspid valve is normal in structure.  9. The aortic valve is normal in structure. Aortic valve regurgitation is not visualized. No evidence of aortic valve sclerosis or stenosis. 10. The pulmonic valve was normal in structure. Pulmonic valve regurgitation is not visualized. 11. Moderately elevated pulmonary artery systolic pressure. 12. The tricuspid regurgitant velocity is 2.82 m/s, and with an assumed right atrial pressure of 15 mmHg, the estimated right ventricular systolic pressure is moderately elevated at 46.8 mmHg. 13. The inferior vena cava is normal in size with greater than 50% respiratory variability, suggesting right atrial  pressure of 3 mmHg. FINDINGS  Left Ventricle: Left ventricular ejection fraction, by visual estimation, is 60 to 65%. The left ventricle has normal function. The left ventricle has no regional wall motion abnormalities. There is no left ventricular hypertrophy. The interventricular septum is flattened in systole and diastole, consistent with right ventricular pressure and volume overload. Left ventricular diastolic parameters were normal. Normal left atrial pressure. Right Ventricle: The right ventricular size is normal. No increase in right ventricular wall thickness. Global RV systolic function is has normal systolic function. The tricuspid regurgitant velocity is 2.82 m/s, and with an assumed right atrial pressure  of 15 mmHg, the estimated right ventricular systolic pressure is moderately elevated at 46.8 mmHg. Left Atrium: Left atrial size was normal in size. Right Atrium: Right atrial size was normal in size Pericardium: There is no evidence of pericardial effusion. Mitral Valve: The mitral valve is  normal in structure. Mild mitral valve regurgitation. No evidence of mitral valve stenosis by observation. Tricuspid Valve: The tricuspid valve is normal in structure. Tricuspid valve regurgitation is mild. Aortic Valve: The aortic valve is normal in structure. Aortic valve regurgitation is not visualized. The aortic valve is structurally normal, with no evidence of sclerosis or stenosis. Pulmonic Valve: The pulmonic valve was normal in structure. Pulmonic valve regurgitation is not visualized. Pulmonic regurgitation is not visualized. Aorta: The aortic root, ascending aorta and aortic arch are all structurally normal, with no evidence of dilitation or obstruction. Venous: The inferior vena cava is normal in size with greater than 50% respiratory variability, suggesting right atrial pressure of 3 mmHg. IAS/Shunts: No atrial level shunt detected by color flow Doppler. There is no evidence of a patent foramen ovale. No ventricular septal defect is seen or detected. There is no evidence of an atrial septal defect.  LEFT VENTRICLE PLAX 2D LVIDd:         3.90 cm  Diastology LVIDs:         3.10 cm  LV e' lateral:   7.94 cm/s LV PW:         0.90 cm  LV E/e' lateral: 13.0 LV IVS:        0.80 cm  LV e' medial:    8.38 cm/s LVOT diam:     2.10 cm  LV E/e' medial:  12.3 LV SV:         28 ml LV SV Index:   14.04 LVOT Area:     3.46 cm  RIGHT VENTRICLE RV S prime:     14.50 cm/s TAPSE (M-mode): 1.6 cm LEFT ATRIUM           Index       RIGHT ATRIUM           Index LA diam:      3.60 cm 1.82 cm/m  RA Area:     17.10 cm LA Vol (A4C): 47.3 ml 23.93 ml/m RA Volume:   45.70 ml  23.12 ml/m  AORTIC VALVE LVOT Vmax:   80.30 cm/s LVOT Vmean:  50.100 cm/s LVOT VTI:    0.146 m  AORTA Ao Root diam: 3.00 cm Ao Asc diam:  2.90 cm MITRAL VALVE                         TRICUSPID VALVE MV Area (PHT): 3.72 cm              TR Peak grad:   31.8 mmHg MV PHT:  59.16 msec            TR Vmax:        282.00 cm/s MV Decel Time: 204 msec MV E  velocity: 103.00 cm/s 103 cm/s  SHUNTS MV A velocity: 41.60 cm/s  70.3 cm/s Systemic VTI:  0.15 m MV E/A ratio:  2.48        1.5       Systemic Diam: 2.10 cm  Ena Dawley MD Electronically signed by Ena Dawley MD Signature Date/Time: 05/28/2019/4:49:20 PM    Final     Microbiology Recent Results (from the past 240 hour(s))  Aerobic/Anaerobic Culture (surgical/deep wound)     Status: None   Collection Time: 06/14/2019  3:20 PM   Specimen: Wound  Result Value Ref Range Status   Specimen Description WOUND LOWER LEFT LUNG  Final   Special Requests NONE  Final   Gram Stain   Final    RARE WBC PRESENT, PREDOMINANTLY PMN FEW GRAM NEGATIVE RODS RARE GRAM POSITIVE COCCI RARE GRAM POSITIVE RODS    Culture   Final    ABUNDANT PSEUDOMONAS AERUGINOSA FEW STAPHYLOCOCCUS AUREUS NO ANAEROBES ISOLATED Performed at [facility], [place] [address] [place], [place] [address]    Report Status June 05, 2019 FINAL  Final   Organism ID, Bacteria PSEUDOMONAS AERUGINOSA  Final   Organism ID, Bacteria STAPHYLOCOCCUS AUREUS  Final      Susceptibility   Pseudomonas aeruginosa - MIC*    CEFTAZIDIME 4 SENSITIVE Sensitive     CIPROFLOXACIN <=0.25 SENSITIVE Sensitive     GENTAMICIN 4 SENSITIVE Sensitive     IMIPENEM 1 SENSITIVE Sensitive     PIP/TAZO 8 SENSITIVE Sensitive     CEFEPIME 2 SENSITIVE Sensitive     * ABUNDANT PSEUDOMONAS AERUGINOSA   Staphylococcus aureus - MIC*    CIPROFLOXACIN <=0.5 SENSITIVE Sensitive     ERYTHROMYCIN <=0.25 SENSITIVE Sensitive     GENTAMICIN <=0.5 SENSITIVE Sensitive     OXACILLIN <=0.25 SENSITIVE Sensitive     TETRACYCLINE <=1 SENSITIVE Sensitive     VANCOMYCIN <=0.5 SENSITIVE Sensitive     TRIMETH/SULFA <=10 SENSITIVE Sensitive     CLINDAMYCIN <=0.25 SENSITIVE Sensitive     RIFAMPIN <=0.5 SENSITIVE Sensitive     Inducible Clindamycin NEGATIVE Sensitive     * FEW STAPHYLOCOCCUS AUREUS  SARS CORONAVIRUS 2 (TAT 6-24 HRS) Nasopharyngeal Nasopharyngeal Swab      Status: None   Collection Time: 06/03/2019  3:20 PM   Specimen: Nasopharyngeal Swab  Result Value Ref Range Status   SARS Coronavirus 2 NEGATIVE NEGATIVE Final    Comment: (NOTE) SARS-CoV-2 target nucleic acids are NOT DETECTED. The SARS-CoV-2 RNA is generally detectable in upper and lower respiratory specimens during the acute phase of infection. Negative results do not preclude SARS-CoV-2 infection, do not rule out co-infections with other pathogens, and should not be used as the sole basis for treatment or other patient management decisions. Negative results must be combined with clinical observations, patient history, and epidemiological information. The expected result is Negative. Fact Sheet for Patients: SugarRoll.be Fact Sheet for Healthcare Providers: https://www.woods-mathews.com/ This test is not yet approved or cleared by the [place] FDA and  has been authorized for detection and/or diagnosis of SARS-CoV-2 by FDA under an Emergency Use Authorization (EUA). This EUA will remain  in effect (meaning this test can be used) for the duration of the COVID-19 declaration under Section 56 4(b)(1) of the Act, 21 U.S.C. section 360bbb-3(b)(1), unless the authorization is terminated or  revoked sooner. Performed at [facility], [place] [address], [place], [place] [address]   Blood culture (routine x 2)     Status: None (Preliminary result)   Collection Time: 06/22/2019  3:50 PM   Specimen: BLOOD  Result Value Ref Range Status   Specimen Description BLOOD SITE NOT SPECIFIED  Final   Special Requests   Final    BOTTLES DRAWN AEROBIC AND ANAEROBIC Blood Culture adequate volume   Culture   Final    NO GROWTH 4 DAYS Performed at [facility], [place] [address] [place], [place] [address]    Report Status PENDING  Incomplete  Blood culture (routine x 2)     Status: None (Preliminary result)   Collection Time: 06/12/2019  4:39 PM    Specimen: BLOOD  Result Value Ref Range Status   Specimen Description BLOOD BLOOD RIGHT FOREARM  Final   Special Requests   Final    BOTTLES DRAWN AEROBIC AND ANAEROBIC Blood Culture adequate volume   Culture   Final    NO GROWTH 4 DAYS Performed at [facility], [place] [address] [place], [place] [address]    Report Status PENDING  Incomplete  MRSA PCR Screening     Status: None   Collection Time: 06/22/2019  8:54 PM   Specimen: Nasal Mucosa; Nasopharyngeal  Result Value Ref Range Status   MRSA by PCR NEGATIVE NEGATIVE Final    Comment:        The GeneXpert MRSA Assay (FDA approved for NASAL specimens only), is one component of a comprehensive MRSA colonization surveillance program. It is not intended to diagnose MRSA infection nor to guide or monitor treatment for MRSA infections. Performed at [facility], [place] [address], [place], [place] [address]     Lab Basic Metabolic Panel: Recent Labs  Lab 05/27/19 0147 05/27/19 1756 05/28/19 0103 05/28/19 1913 05/29/19 0330 05/29/19 1800 05/30/19 0455  NA 127* 132* 130* 130* 129*  --  132*  K 5.7* 5.5* 5.4* 4.6 4.5  --  4.0  CL 93* 97* 101 99 100  --  99  CO2 18* 18* 18* 18* 19*  --  20*  GLUCOSE 209* 152* 102* 100* 139*  --  126*  BUN 19 28* 32* 41* 47*  --  62*  CREATININE 1.52* 2.27* 2.31* 3.14* 3.38*  --  4.20*  CALCIUM 7.7* 7.9* 7.3* 7.5* 7.3*  --  7.6*  MG 2.2 1.9 1.9 2.1 2.0 1.9 2.0  PHOS 4.8*  --   --  5.2* 5.0* 4.6 5.0*   Liver Function Tests: Recent Labs  Lab 06/08/2019 1106 05/27/19 1756 05/28/19 1913 05/29/19 0330 05/30/19 0455  AST 21 3,578* 1,681* 1,286* 496*  ALT 18 1,576* 1,295* 1,175* 796*  ALKPHOS 122 189* 209* 203* 235*  BILITOT 0.6 1.0 0.9 0.9 0.9  PROT 6.8 5.9* 5.3* 5.3* 6.1*  ALBUMIN 1.9* 1.7* 1.5* 1.5* 2.3*   No results for input(s): LIPASE, AMYLASE in the last 168 hours. No results for input(s): AMMONIA in the last 168 hours. CBC: Recent Labs  Lab 05/27/19 0147  05/27/19 0526 05/28/19 0103 05/28/19 1913 05/29/19 0330 05/30/19 0455  WBC 9.8  --  5.2 6.8 7.3 4.2  HGB 9.3* 9.9* 6.2* 9.2* 9.2* 8.1*  HCT 31.1* 29.0* 20.1* 30.1* 30.3* 25.4*  MCV 96.0  --  94.8 91.8 93.8 89.8  PLT 348  --  131* 157 155 125*   Cardiac Enzymes: No results for input(s): CKTOTAL, CKMB,  CKMBINDEX, TROPONINI in the last 168 hours. Sepsis Labs: Recent Labs  Lab 05/27/19 0740 05/27/19 1754 05/27/19 2054 05/28/19 0103 05/28/19 1321 05/28/19 1913 05/29/19 0330 05/30/19 0455  WBC  --   --   --  5.2  --  6.8 7.3 4.2  LATICACIDVEN 8.2* 6.2* 4.8*  --  3.7*  --   --   --     Procedures/Operations  cvc 1/3   Audria Nine 2019-06-19, 10:59 AM

## 2019-06-26 DEATH — deceased

## 2020-02-08 IMAGING — CT CT CHEST HIGH RESOLUTION W/O CM
2 of 6 series · 13 of 36 positions shown, 16 images · non-contrast
Comparison: Chest CT 11/15/2015.
COMPARISON: Chest CT 11/15/2015.

Addendum:
CLINICAL DATA: 78-year-old male with history of dyspnea. Evaluate
for interstitial lung disease.

EXAM:
CT CHEST WITHOUT CONTRAST
TECHNIQUE: Multidetector CT imaging of the chest was performed following the
standard protocol without intravenous contrast. High resolution
imaging of the lungs, as well as inspiratory and expiratory imaging,
was performed.

[Series 2: high resolution · axial · 0.72mm/px · z∈[-338,-44]mm · 10 of 173 slices shown, 13 images]
[im 17/173  mediastinal]
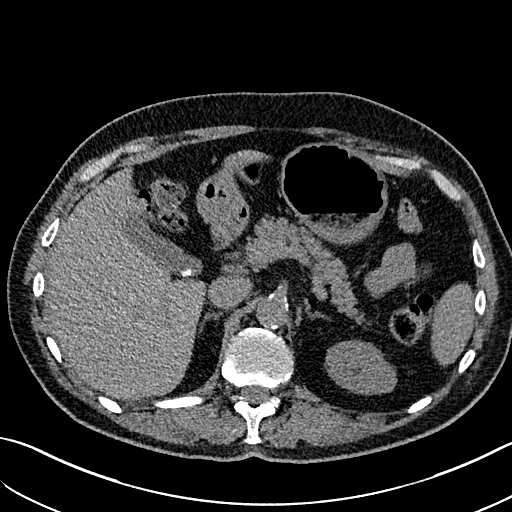
[im 17/173  lung]
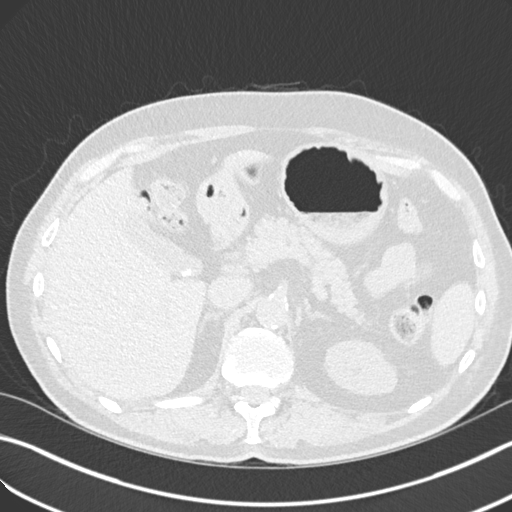
[im 33/173  lung]
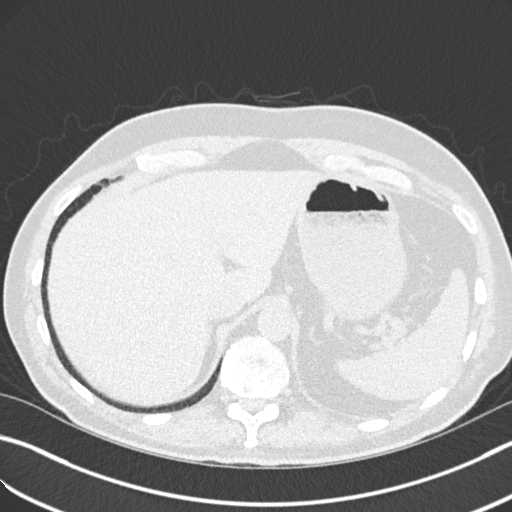
[im 50/173  lung]
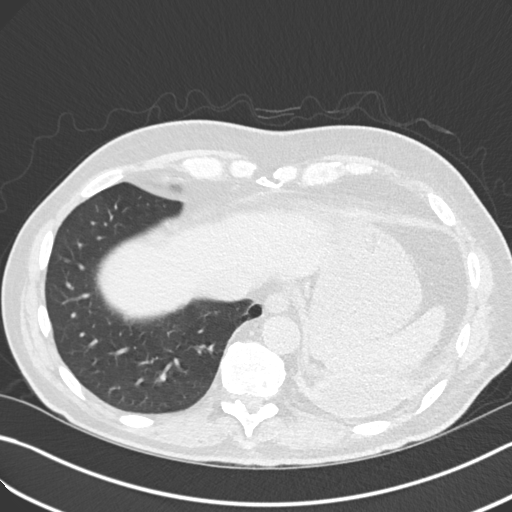
[im 66/173  lung]
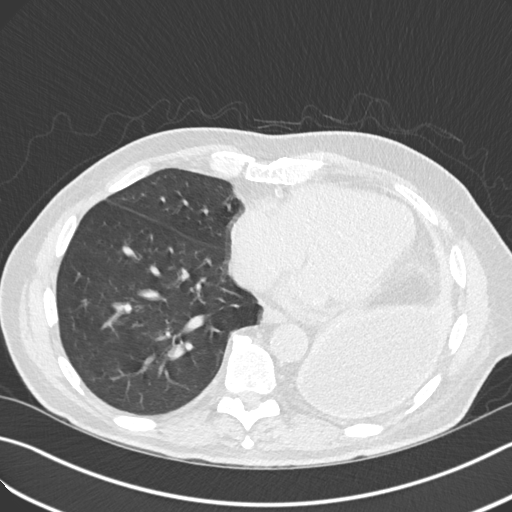
[im 82/173  mediastinal]
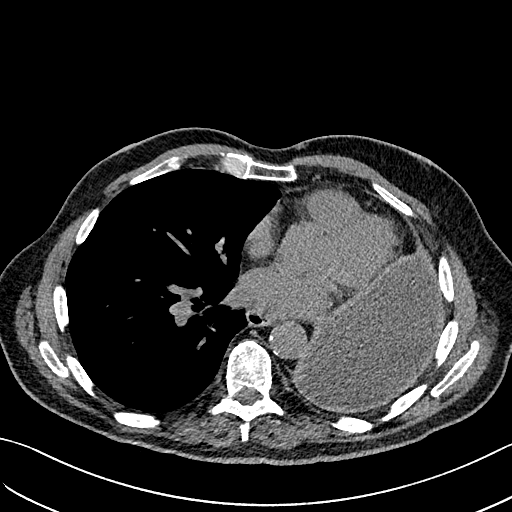
[im 82/173  lung]
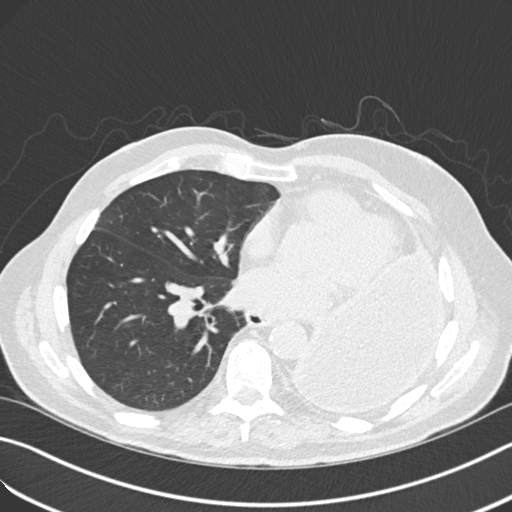
[im 99/173  lung]
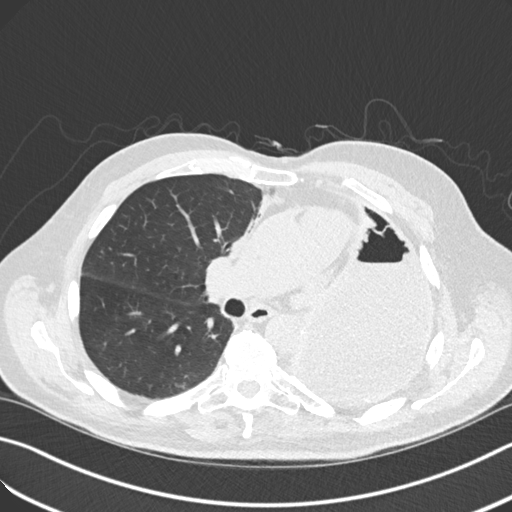
[im 115/173  lung]
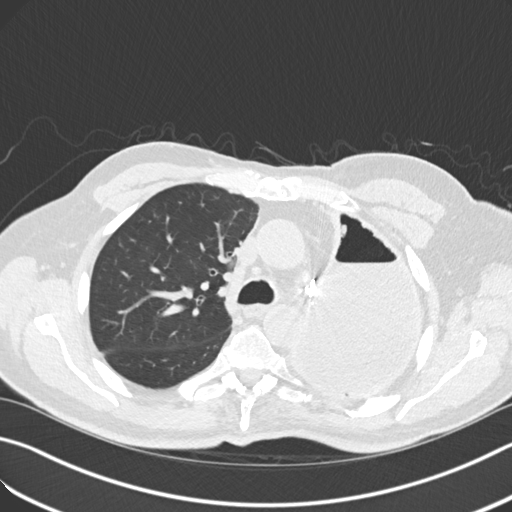
[im 132/173  lung]
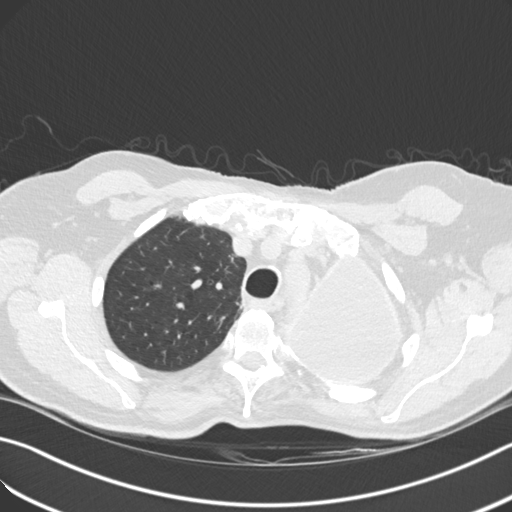
[im 148/173  mediastinal]
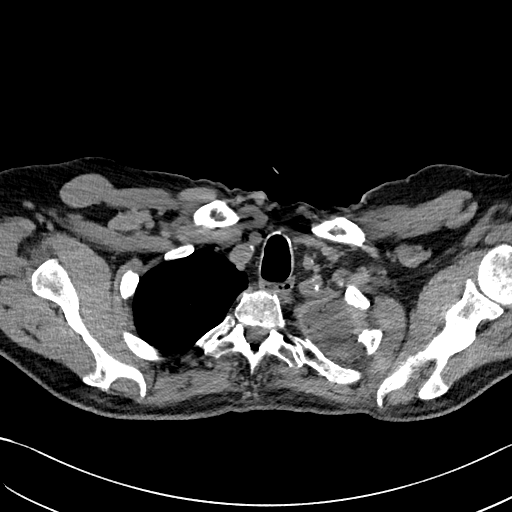
[im 148/173  lung]
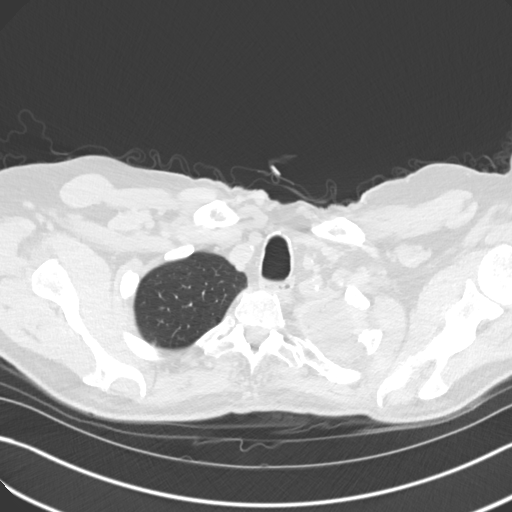
[im 164/173  lung]
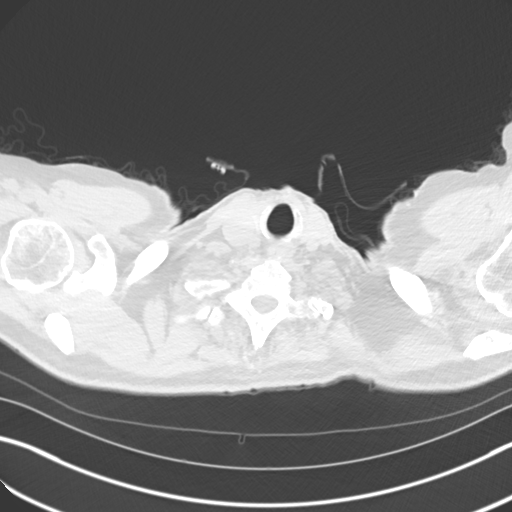

[Series 8: coronal · coronal · 0.71mm/px · 3 of 115 slices shown]
[im 23/115  lung]
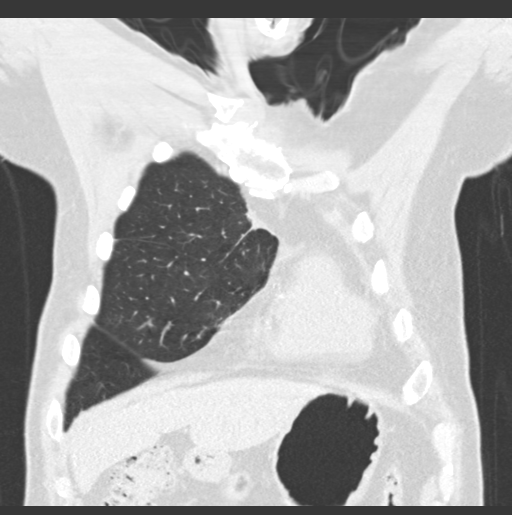
[im 46/115  lung]
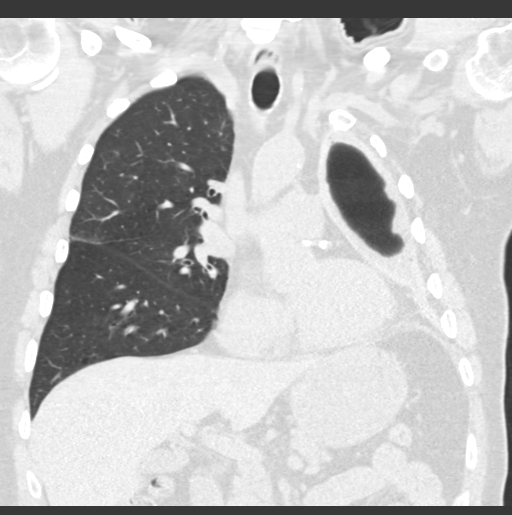
[im 69/115  lung]
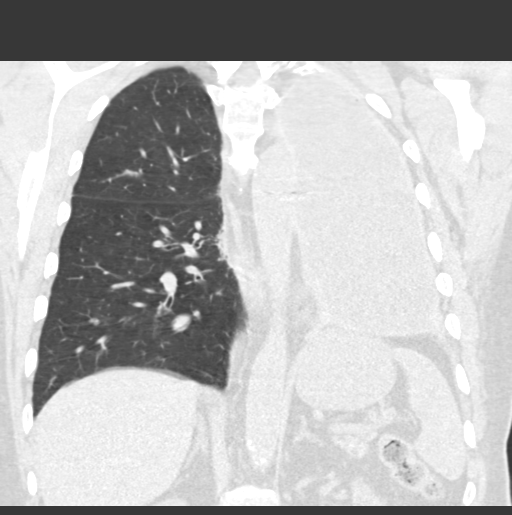

[13 of 36 positions shown; findings below may reference images not displayed]

FINDINGS: Cardiovascular: Heart size is normal. There is no significant
pericardial fluid, thickening or pericardial calcification. There is
aortic atherosclerosis, as well as atherosclerosis of the great
vessels of the mediastinum and the coronary arteries, including
calcified atherosclerotic plaque in the left main, left anterior
descending, left circumflex and right coronary arteries.

Mediastinum/Nodes: No pathologically enlarged mediastinal or hilar
lymph nodes. Please note that accurate exclusion of hilar adenopathy
is limited on noncontrast CT scans. Esophagus is unremarkable in
appearance. No axillary lymphadenopathy.

Lungs/Pleura: Status post left pneumonectomy. There is now some gas
within the non dependent portion of the fluid collection in the
evacuated left pleural space. High-resolution images demonstrate no
significant regions of ground-glass attenuation, septal thickening,
subpleural reticulation, parenchymal banding, traction
bronchiectasis or frank honeycombing. Inspiratory and expiratory
imaging is unremarkable.

Upper Abdomen: Aortic atherosclerosis. 7 mm calcified gallstone
lying dependently in the gallbladder. No findings to suggest an
acute cholecystitis at this time.

Musculoskeletal: There are no aggressive appearing lytic or blastic
lesions noted in the visualized portions of the skeleton.
IMPRESSION: 1. No findings to suggest interstitial lung disease.
2. New gas within the chronic fluid collection in the of Accu aided
left pleural space. This is of uncertain etiology and significance,
but could be seen in the setting of infection, or could indicate
leakage from the left bronchial stump. Further clinical evaluation
is recommended.
3. Aortic atherosclerosis, in addition to left main and 3 vessel
coronary artery disease. Assessment for potential risk factor
modification, dietary therapy or pharmacologic therapy may be
warranted, if clinically indicated.
4. Cholelithiasis without evidence of acute cholecystitis at this
time.

These results will be called to the ordering clinician or
representative by the Radiologist Assistant, and communication
documented in the PACS or zVision Dashboard.

Aortic Atherosclerosis ([id number]-5B8.8).

ADDENDUM:
There was an uncorrected voice recognition error in the original
dictation. Specifically, impression #2 should read as follows:

New gas within the chronic fluid collection in the EVACUATED left
pleural space. This is of uncertain etiology and significance, but
could be seen in the setting of infection, or could indicate leakage
from the left bronchial stump. Further clinical evaluation is
recommended.

*** End of Addendum ***
FINDINGS: Cardiovascular: Heart size is normal. There is no significant
pericardial fluid, thickening or pericardial calcification. There is
aortic atherosclerosis, as well as atherosclerosis of the great
vessels of the mediastinum and the coronary arteries, including
calcified atherosclerotic plaque in the left main, left anterior
descending, left circumflex and right coronary arteries.

Mediastinum/Nodes: No pathologically enlarged mediastinal or hilar
lymph nodes. Please note that accurate exclusion of hilar adenopathy
is limited on noncontrast CT scans. Esophagus is unremarkable in
appearance. No axillary lymphadenopathy.

Lungs/Pleura: Status post left pneumonectomy. There is now some gas
within the non dependent portion of the fluid collection in the
evacuated left pleural space. High-resolution images demonstrate no
significant regions of ground-glass attenuation, septal thickening,
subpleural reticulation, parenchymal banding, traction
bronchiectasis or frank honeycombing. Inspiratory and expiratory
imaging is unremarkable.

Upper Abdomen: Aortic atherosclerosis. 7 mm calcified gallstone
lying dependently in the gallbladder. No findings to suggest an
acute cholecystitis at this time.

Musculoskeletal: There are no aggressive appearing lytic or blastic
lesions noted in the visualized portions of the skeleton.
IMPRESSION: 1. No findings to suggest interstitial lung disease.
2. New gas within the chronic fluid collection in the of Accu aided
left pleural space. This is of uncertain etiology and significance,
but could be seen in the setting of infection, or could indicate
leakage from the left bronchial stump. Further clinical evaluation
is recommended.
3. Aortic atherosclerosis, in addition to left main and 3 vessel
coronary artery disease. Assessment for potential risk factor
modification, dietary therapy or pharmacologic therapy may be
warranted, if clinically indicated.
4. Cholelithiasis without evidence of acute cholecystitis at this
time.

These results will be called to the ordering clinician or
representative by the Radiologist Assistant, and communication
documented in the PACS or zVision Dashboard.

Aortic Atherosclerosis ([id number]).
